# Patient Record
Sex: Female | Born: 1980 | Race: White | Hispanic: No | Marital: Married | Smoking: Current every day smoker
Health system: Southern US, Community
[De-identification: ages and names within clinical notes are randomized; demographics above are authoritative.]

## PROBLEM LIST (undated history)

## (undated) DIAGNOSIS — R42 Dizziness and giddiness: Secondary | ICD-10-CM

## (undated) DIAGNOSIS — F419 Anxiety disorder, unspecified: Secondary | ICD-10-CM

## (undated) DIAGNOSIS — R55 Syncope and collapse: Secondary | ICD-10-CM

## (undated) DIAGNOSIS — R002 Palpitations: Secondary | ICD-10-CM

## (undated) HISTORY — DX: Dizziness and giddiness: R42

## (undated) HISTORY — DX: Syncope and collapse: R55

## (undated) HISTORY — DX: Palpitations: R00.2

---

## 2009-03-29 ENCOUNTER — Emergency Department (HOSPITAL_BASED_OUTPATIENT_CLINIC_OR_DEPARTMENT_OTHER): Admission: EM | Admit: 2009-03-29 | Discharge: 2009-03-29 | Payer: Self-pay | Admitting: Emergency Medicine

## 2009-03-29 ENCOUNTER — Ambulatory Visit: Payer: Self-pay | Admitting: Diagnostic Radiology

## 2010-07-11 ENCOUNTER — Emergency Department (HOSPITAL_BASED_OUTPATIENT_CLINIC_OR_DEPARTMENT_OTHER)
Admission: EM | Admit: 2010-07-11 | Discharge: 2010-07-11 | Disposition: A | Discharge status: Home / Self Care | Payer: Self-pay | Attending: Emergency Medicine | Admitting: Emergency Medicine

## 2010-07-11 DIAGNOSIS — F17200 Nicotine dependence, unspecified, uncomplicated: Secondary | ICD-10-CM | POA: Insufficient documentation

## 2010-07-11 DIAGNOSIS — R21 Rash and other nonspecific skin eruption: Secondary | ICD-10-CM | POA: Insufficient documentation

## 2010-07-13 ENCOUNTER — Emergency Department (HOSPITAL_BASED_OUTPATIENT_CLINIC_OR_DEPARTMENT_OTHER)
Admission: EM | Admit: 2010-07-13 | Discharge: 2010-07-13 | Disposition: A | Discharge status: Home / Self Care | Payer: Self-pay | Attending: Emergency Medicine | Admitting: Emergency Medicine

## 2010-07-13 DIAGNOSIS — F17200 Nicotine dependence, unspecified, uncomplicated: Secondary | ICD-10-CM | POA: Insufficient documentation

## 2010-07-13 DIAGNOSIS — L509 Urticaria, unspecified: Secondary | ICD-10-CM | POA: Insufficient documentation

## 2010-07-14 LAB — DIFFERENTIAL
Basophils Relative: 0 % (ref 0–1)
Eosinophils Absolute: 0.1 10*3/uL (ref 0.0–0.7)
Eosinophils Relative: 1 % (ref 0–5)
Monocytes Absolute: 0.5 10*3/uL (ref 0.1–1.0)
Monocytes Relative: 5 % (ref 3–12)
Neutrophils Relative %: 75 % (ref 43–77)

## 2010-07-14 LAB — URINALYSIS, ROUTINE W REFLEX MICROSCOPIC
Ketones, ur: NEGATIVE mg/dL
Nitrite: NEGATIVE
Specific Gravity, Urine: 1.008 (ref 1.005–1.030)
pH: 5.5 (ref 5.0–8.0)

## 2010-07-14 LAB — COMPREHENSIVE METABOLIC PANEL
ALT: 24 U/L (ref 0–35)
Albumin: 4.4 g/dL (ref 3.5–5.2)
Alkaline Phosphatase: 77 U/L (ref 39–117)
Glucose, Bld: 113 mg/dL — ABNORMAL HIGH (ref 70–99)
Potassium: 4.4 mEq/L (ref 3.5–5.1)
Sodium: 143 mEq/L (ref 135–145)
Total Protein: 7.7 g/dL (ref 6.0–8.3)

## 2010-07-14 LAB — GC/CHLAMYDIA PROBE AMP, GENITAL

## 2010-07-14 LAB — PREGNANCY, URINE

## 2010-07-14 LAB — CBC
Hemoglobin: 13.5 g/dL (ref 12.0–15.0)
RDW: 12.3 % (ref 11.5–15.5)

## 2010-11-15 ENCOUNTER — Encounter (HOSPITAL_COMMUNITY): Payer: Self-pay

## 2010-11-15 ENCOUNTER — Encounter: Payer: Self-pay | Admitting: Emergency Medicine

## 2010-11-15 ENCOUNTER — Emergency Department (HOSPITAL_BASED_OUTPATIENT_CLINIC_OR_DEPARTMENT_OTHER)
Admission: EM | Admit: 2010-11-15 | Discharge: 2010-11-15 | Disposition: A | Discharge status: Home / Self Care | Payer: Self-pay | Attending: Emergency Medicine | Admitting: Emergency Medicine

## 2010-11-15 ENCOUNTER — Inpatient Hospital Stay (HOSPITAL_COMMUNITY)
Admission: AD | Admit: 2010-11-15 | Discharge: 2010-11-15 | Disposition: A | Discharge status: Home / Self Care | Payer: Self-pay | Source: Ambulatory Visit | Attending: Obstetrics & Gynecology | Admitting: Obstetrics & Gynecology

## 2010-11-15 ENCOUNTER — Inpatient Hospital Stay (HOSPITAL_COMMUNITY): Payer: Self-pay

## 2010-11-15 DIAGNOSIS — F17200 Nicotine dependence, unspecified, uncomplicated: Secondary | ICD-10-CM | POA: Insufficient documentation

## 2010-11-15 DIAGNOSIS — O99891 Other specified diseases and conditions complicating pregnancy: Secondary | ICD-10-CM | POA: Insufficient documentation

## 2010-11-15 DIAGNOSIS — O200 Threatened abortion: Secondary | ICD-10-CM | POA: Insufficient documentation

## 2010-11-15 DIAGNOSIS — O4690 Antepartum hemorrhage, unspecified, unspecified trimester: Secondary | ICD-10-CM

## 2010-11-15 DIAGNOSIS — R109 Unspecified abdominal pain: Secondary | ICD-10-CM | POA: Insufficient documentation

## 2010-11-15 LAB — URINALYSIS, ROUTINE W REFLEX MICROSCOPIC
Leukocytes, UA: NEGATIVE
Protein, ur: NEGATIVE mg/dL
Urobilinogen, UA: 0.2 mg/dL (ref 0.0–1.0)

## 2010-11-15 LAB — CBC
HCT: 36.5 % (ref 36.0–46.0)
MCH: 29.7 pg (ref 26.0–34.0)
MCHC: 35.3 g/dL (ref 30.0–36.0)
RDW: 13 % (ref 11.5–15.5)

## 2010-11-15 LAB — DIFFERENTIAL
Basophils Absolute: 0 10*3/uL (ref 0.0–0.1)
Basophils Relative: 0 % (ref 0–1)
Eosinophils Absolute: 0.1 10*3/uL (ref 0.0–0.7)
Monocytes Absolute: 0.6 10*3/uL (ref 0.1–1.0)
Neutro Abs: 9.7 10*3/uL — ABNORMAL HIGH (ref 1.7–7.7)
Neutrophils Relative %: 80 % — ABNORMAL HIGH (ref 43–77)

## 2010-11-15 LAB — PREGNANCY, URINE: Preg Test, Ur: POSITIVE

## 2010-11-15 LAB — WET PREP, GENITAL
Clue Cells Wet Prep HPF POC: NONE SEEN
Yeast Wet Prep HPF POC: NONE SEEN

## 2010-11-15 NOTE — ED Provider Notes (Signed)
History     CSN: [id number] Arrival date & time: 11/15/2010 10:07 AM  Chief Complaint  Patient presents with  . Vaginal Bleeding   Patient is a 30 y.o. female presenting with vaginal bleeding. The history is provided by the patient.  Vaginal Bleeding  Pt reports she had a positive home pregnancy test last week. She thinks she is about [redacted]wks pregnant by LMP in May. She had some vaginal bleeding overnight which has imprvoed this morning. She used 3 pads overnight. She has had some lower abdominal cramping as well, mild-moderate. She did not pass any clots or tissue. She has had two prior pregnancies, no miscarriages, no ectopics, no STDs.   History reviewed. No pertinent past medical history.  History reviewed. No pertinent past surgical history.  History reviewed. No pertinent family history.  History  Substance Use Topics  . Smoking status: Current Everyday Smoker -- 0.5 packs/day for 12 years    Types: Cigarettes  . Smokeless tobacco: Not on file  . Alcohol Use: No    OB History    Grav Para Term Preterm Abortions TAB SAB Ect Mult Living                  Review of Systems  Genitourinary: Positive for vaginal bleeding.  All other systems reviewed and are negative.    Physical Exam  BP 118/68  Pulse 99  Temp(Src) 98.6 F (37 C) (Oral)  Resp 16  Ht 5\' 6"  (1.676 m)  Wt 198 lb (89.812 kg)  BMI 31.96 kg/m2  SpO2 100%  LMP 08/28/2010  Physical Exam  Nursing note and vitals reviewed. Constitutional: She is oriented to person, place, and time. She appears well-developed and well-nourished.  HENT:  Head: Normocephalic and atraumatic.  Eyes: EOM are normal. Pupils are equal, round, and reactive to light.  Neck: Normal range of motion. Neck supple.  Cardiovascular: Normal rate, normal heart sounds and intact distal pulses.   Pulmonary/Chest: Effort normal and breath sounds normal.  Abdominal: Bowel sounds are normal. She exhibits no distension. There is no tenderness.    Genitourinary:       deferred  Musculoskeletal: Normal range of motion. She exhibits no edema and no tenderness.  Neurological: She is alert and oriented to person, place, and time. No cranial nerve deficit.  Skin: Skin is warm and dry. No rash noted.  Psychiatric: She has a normal mood and affect.    ED Course  Procedures  MDM Pt has positive pregnancy test here. Discussed need for further eval including quant, [place], blood work etc to rule out Ectopic. We do not have [place] available here today. Discussed transfer to MAU but patient prefers to go by private vehicle. MAU NP aware patient is coming.       Charles B. Bernette Mayers, MD 11/15/10 1126

## 2010-11-15 NOTE — ED Provider Notes (Signed)
History     CSN: [id number] Arrival date & time: 11/15/2010 12:21 PM  Chief Complaint  Patient presents with  . Vaginal Bleeding  . Abdominal Cramping   HPI Allison Cunningham is a 30 y.o. WF who presents to MAU at 11.[redacted] weeks gestation with low abdominal cramping that started in June. Last week had light bleeding. Here to be sure everything is ok. No PNC. LMP 08/28/10, Last PAP smear 2 years ago and was normal. Was at [facility] today and they suggested she come here for further evaluation. Current sex partner x 3 months. No history of STI's.  No past medical history on file.  Past Surgical History  Procedure Date  . Cesarean section     No family history on file.  History  Substance Use Topics  . Smoking status: Current Everyday Smoker -- 1.0 packs/day for 12 years    Types: Cigarettes  . Smokeless tobacco: Never Used  . Alcohol Use: No    OB History    Grav Para Term Preterm Abortions TAB SAB Ect Mult Living   3 2 2       1       Review of Systems  Constitutional: Positive for unexpected weight change. Negative for fever, chills, diaphoresis and fatigue.  HENT: Negative for ear pain, congestion, sore throat, facial swelling, neck pain, neck stiffness, dental problem and sinus pressure.   Eyes: Negative for photophobia, pain and discharge.  Respiratory: Negative for cough, chest tightness and wheezing.   Cardiovascular: Negative.   Gastrointestinal: Positive for abdominal pain. Negative for nausea, vomiting, diarrhea, constipation and abdominal distention.  Genitourinary: Positive for vaginal pain and pelvic pain. Negative for dysuria, frequency, flank pain and difficulty urinating.  Musculoskeletal: Negative for myalgias, back pain and gait problem.  Skin: Negative for color change and rash.  Neurological: Negative for dizziness, speech difficulty, weakness, light-headedness, numbness and headaches.  Hematological: Negative.   Psychiatric/Behavioral: Negative for confusion and  agitation.    Physical Exam  BP 130/82  Pulse 107  Temp(Src) 98.5 F (36.9 C) (Oral)  Resp 16  Ht 5\' 7"  (1.702 m)  Wt 191 lb 3.2 oz (86.728 kg)  BMI 29.95 kg/m2  LMP 08/28/2010  Physical Exam  Nursing note and vitals reviewed. Constitutional: She is oriented to person, place, and time. She appears well-developed and well-nourished.  HENT:  Head: Normocephalic and atraumatic.  Eyes: EOM are normal.  Neck: Neck supple.  Pulmonary/Chest: Effort normal.  Abdominal: Soft. There is no tenderness.  Genitourinary: Vagina normal. There is no lesion on the right labia. There is no lesion on the left labia.       The cervix is long and closed, there is no adnexal tenderness, the uterus is approximately 12 week size. No bleeding noted.   Musculoskeletal: Normal range of motion.  Neurological: She is alert and oriented to person, place, and time. No cranial nerve deficit.  Skin: Skin is warm and dry.    ED Course  Procedures  Results for orders placed during the hospital encounter of 11/15/10 (from the past 24 hour(s))  CBC     Status: Abnormal   Collection Time   11/15/10  1:50 PM      Component Value Range   WBC 12.2 (*) 4.0 - 10.5 (K/uL)   RBC 4.35  3.87 - 5.11 (MIL/uL)   Hemoglobin 12.9  12.0 - 15.0 (g/dL)   HCT 13.0  86.5 - 78.4 (%)   MCV 83.9  78.0 - 100.0 (fL)  MCH 29.7  26.0 - 34.0 (pg)   MCHC 35.3  30.0 - 36.0 (g/dL)   RDW 40.9  81.1 - 91.4 (%)   Platelets 231  150 - 400 (K/uL)  DIFFERENTIAL     Status: Abnormal   Collection Time   11/15/10  1:50 PM      Component Value Range   Neutrophils Relative 80 (*) 43 - 77 (%)   Neutro Abs 9.7 (*) 1.7 - 7.7 (K/uL)   Lymphocytes Relative 15  12 - 46 (%)   Lymphs Abs 1.8  0.7 - 4.0 (K/uL)   Monocytes Relative 5  3 - 12 (%)   Monocytes Absolute 0.6  0.1 - 1.0 (K/uL)   Eosinophils Relative 0  0 - 5 (%)   Eosinophils Absolute 0.1  0.0 - 0.7 (K/uL)   Basophils Relative 0  0 - 1 (%)   Basophils Absolute 0.0  0.0 - 0.1 (K/uL)    HCG, QUANTITATIVE, PREGNANCY     Status: Abnormal   Collection Time   11/15/10  1:50 PM      Component Value Range   hCG, Beta Chain, [place]est [address] (*) <5 (mIU/mL)  ABO/RH     Status: Normal   Collection Time   11/15/10  1:50 PM      Component Value Range   ABO/RH(D) A NEG    WET PREP, GENITAL     Status: Abnormal   Collection Time   11/15/10  2:05 PM      Component Value Range   Yeast, Wet Prep NONE SEEN  NONE SEEN    Trich, Wet Prep NONE SEEN  NONE SEEN    Clue Cells, Wet Prep NONE SEEN  NONE SEEN    WBC, Wet Prep HPF POC MODERATE (*) NONE SEEN     MDM  Ultrasound today shows a 11.2 wk. IUP with cardiac activity, normal ovaries, no SCH identified.  Assessment: Viable IUP at 11.2 wks. Gestation  Plan: Start Prenatal vitamins and prenatal care.            Return here as needed.     Allison Cunningham, [place] 11/15/10 1537

## 2010-11-15 NOTE — Progress Notes (Signed)
Patient reports had some bleeding heavily several weeks ago patient thought had miscarriage, had a negative pregnancy test after two weeks, started bleeding on Monday or Tuesday, having cramping

## 2010-11-15 NOTE — Progress Notes (Signed)
Pt had normal period 08/28/10.  Had positive UPT in June, followed by heavy bleeding and a negative UPT after.  Had bleeding that began a few days ago (maybe 11/11/10) that ended yesterday, but had a +UPT again.  States has not had intercourse since May.

## 2010-11-15 NOTE — ED Notes (Signed)
Pt states she is pregnant, having some vaginal bleeding.  Lighter than period.  Some cramping but not similar to period.

## 2010-11-16 LAB — URINE CULTURE

## 2010-11-17 LAB — GC/CHLAMYDIA PROBE AMP, GENITAL: Chlamydia, DNA Probe: NEGATIVE

## 2011-07-16 ENCOUNTER — Emergency Department (HOSPITAL_BASED_OUTPATIENT_CLINIC_OR_DEPARTMENT_OTHER)
Admission: EM | Admit: 2011-07-16 | Discharge: 2011-07-16 | Disposition: A | Discharge status: Home / Self Care | Payer: Managed Care, Other (non HMO) | Attending: Emergency Medicine | Admitting: Emergency Medicine

## 2011-07-16 ENCOUNTER — Encounter (HOSPITAL_BASED_OUTPATIENT_CLINIC_OR_DEPARTMENT_OTHER): Payer: Self-pay

## 2011-07-16 DIAGNOSIS — F17200 Nicotine dependence, unspecified, uncomplicated: Secondary | ICD-10-CM | POA: Insufficient documentation

## 2011-07-16 DIAGNOSIS — Z0000 Encounter for general adult medical examination without abnormal findings: Secondary | ICD-10-CM | POA: Insufficient documentation

## 2011-07-16 DIAGNOSIS — IMO0002 Reserved for concepts with insufficient information to code with codable children: Secondary | ICD-10-CM

## 2011-07-16 MED ORDER — DEXAMETHASONE SODIUM PHOSPHATE 10 MG/ML IJ SOLN
INTRAMUSCULAR | Status: AC
  Filled 2011-07-16: qty 1

## 2011-07-16 NOTE — ED Provider Notes (Signed)
History     CSN: [id number]  Arrival date & time 07/16/11  1422   First MD Initiated Contact with Patient 07/16/11 1507      Chief Complaint  Patient presents with  . Rabies Injection    (Consider location/radiation/quality/duration/timing/severity/associated sxs/prior treatment) HPI Comments: Pt comes in today stating that her dog was exposed to a rabid raccoon:pt states that there dog was not vaccinated but has been taken by animal control:pt denies any injury from the dog   The history is provided by the patient.    History reviewed. No pertinent past medical history.  Past Surgical History  Procedure Date  . Cesarean section     No family history on file.  History  Substance Use Topics  . Smoking status: Current Everyday Smoker -- 1.0 packs/day for 12 years    Types: Cigarettes  . Smokeless tobacco: Never Used  . Alcohol Use: No    OB History    Grav Para Term Preterm Abortions TAB SAB Ect Mult Living   3 2 2       1       Review of Systems  Respiratory: Negative.   Cardiovascular: Negative.   Skin: Negative.     Allergies  Bacitracin; Aspirin; Doxycycline; Augmentin; and Penicillins  Home Medications   Current Outpatient Rx  Name Route Sig Dispense Refill  . ALPRAZOLAM 0.5 MG PO TABS Oral Take 0.5 mg by mouth at bedtime as needed.      BP 123/73  Pulse 98  Temp(Src) 98.3 F (36.8 C) (Oral)  Resp 16  Ht 5\' 8"  (1.727 m)  Wt 182 lb (82.555 kg)  BMI 27.67 kg/m2  SpO2 99%  LMP 06/26/2011  Breastfeeding? Unknown  Physical Exam  Nursing note and vitals reviewed. Constitutional: She appears well-developed and well-nourished.  Cardiovascular: Normal rate and regular rhythm.   Pulmonary/Chest: Effort normal and breath sounds normal.  Musculoskeletal: Normal range of motion.  Skin: Skin is warm and dry.    ED Course  Procedures (including critical care time)  Labs Reviewed - No data to display No results found.   1. Normal skin exam        MDM  Pt has not had any injury:pt doesn't meet criteria to have rabies series       Teressa Lower, NP 07/16/11 1556

## 2011-07-16 NOTE — ED Provider Notes (Signed)
Medical screening examination/treatment/procedure(s) were performed by non-physician practitioner and as supervising physician I was immediately available for consultation/collaboration.   Nat Christen, MD 07/16/11 [phone]

## 2011-07-16 NOTE — ED Notes (Signed)
Sent to ED for rabies injection evaluation. Family dog exposed to positive tested raccoon.  No injury.

## 2011-10-02 ENCOUNTER — Emergency Department (HOSPITAL_BASED_OUTPATIENT_CLINIC_OR_DEPARTMENT_OTHER)
Admission: EM | Admit: 2011-10-02 | Discharge: 2011-10-02 | Disposition: A | Discharge status: Home / Self Care | Payer: Managed Care, Other (non HMO) | Attending: Emergency Medicine | Admitting: Emergency Medicine

## 2011-10-02 ENCOUNTER — Emergency Department (HOSPITAL_BASED_OUTPATIENT_CLINIC_OR_DEPARTMENT_OTHER): Payer: Managed Care, Other (non HMO)

## 2011-10-02 ENCOUNTER — Encounter (HOSPITAL_BASED_OUTPATIENT_CLINIC_OR_DEPARTMENT_OTHER): Payer: Self-pay | Admitting: *Deleted

## 2011-10-02 DIAGNOSIS — R1011 Right upper quadrant pain: Secondary | ICD-10-CM | POA: Insufficient documentation

## 2011-10-02 DIAGNOSIS — F411 Generalized anxiety disorder: Secondary | ICD-10-CM | POA: Insufficient documentation

## 2011-10-02 DIAGNOSIS — F17200 Nicotine dependence, unspecified, uncomplicated: Secondary | ICD-10-CM | POA: Insufficient documentation

## 2011-10-02 DIAGNOSIS — R109 Unspecified abdominal pain: Secondary | ICD-10-CM

## 2011-10-02 HISTORY — DX: Anxiety disorder, unspecified: F41.9

## 2011-10-02 LAB — DIFFERENTIAL
Eosinophils Relative: 4 % (ref 0–5)
Lymphocytes Relative: 26 % (ref 12–46)
Lymphs Abs: 2 10*3/uL (ref 0.7–4.0)
Monocytes Relative: 7 % (ref 3–12)

## 2011-10-02 LAB — URINALYSIS, ROUTINE W REFLEX MICROSCOPIC
Bilirubin Urine: NEGATIVE
Glucose, UA: NEGATIVE mg/dL
Ketones, ur: NEGATIVE mg/dL
Leukocytes, UA: NEGATIVE
Protein, ur: NEGATIVE mg/dL

## 2011-10-02 LAB — URINE MICROSCOPIC-ADD ON

## 2011-10-02 LAB — CBC
HCT: 34.9 % — ABNORMAL LOW (ref 36.0–46.0)
Hemoglobin: 12 g/dL (ref 12.0–15.0)
MCV: 82.7 fL (ref 78.0–100.0)
Platelets: 242 10*3/uL (ref 150–400)
RBC: 4.22 MIL/uL (ref 3.87–5.11)
WBC: 7.6 10*3/uL (ref 4.0–10.5)

## 2011-10-02 LAB — COMPREHENSIVE METABOLIC PANEL
ALT: 9 U/L (ref 0–35)
Alkaline Phosphatase: 59 U/L (ref 39–117)
CO2: 27 mEq/L (ref 19–32)
Calcium: 9.3 mg/dL (ref 8.4–10.5)
GFR calc Af Amer: >90 mL/min (ref >90)
GFR calc non Af Amer: >90 mL/min (ref >90)
Glucose, Bld: 98 mg/dL (ref 70–99)
Sodium: 137 mEq/L (ref 135–145)

## 2011-10-02 MED ORDER — HYDROCODONE-ACETAMINOPHEN 5-325 MG PO TABS: ORAL_TABLET | ORAL | Status: AC

## 2011-10-02 MED ORDER — OMEPRAZOLE 20 MG PO CPDR: DELAYED_RELEASE_CAPSULE | ORAL | Status: DC

## 2011-10-02 MED ORDER — SUCRALFATE 1 G PO TABS: 1.0000 g | ORAL_TABLET | Freq: Four times a day (QID) | ORAL | Status: DC

## 2011-10-02 NOTE — ED Provider Notes (Signed)
History     CSN: [id number]  Arrival date & time 10/02/11  1331   First MD Initiated Contact with Patient 10/02/11 1353      Chief Complaint  Patient presents with  . Abdominal Pain    (Consider location/radiation/quality/duration/timing/severity/associated sxs/prior treatment) HPI Comments: Patient presents with chief complaint of abdominal pain for the past 2 days. Pain is in the right upper quadrant with radiation around to her right middle back. Pain is described as a burning. It is associated with nausea and vomiting. Palpation makes the pain worse. Eating makes the pain better. Pain is intermittent. At its worst the pain is 10 out of 10. Pain is currently 4/10. There are times when the pain is 0/10. Patient denies fevers, changes in bowel habits, blood in her stool, black tarry stools, urinary symptoms. The patient has a gallbladder and appendix. Her only abdominal surgeries are 2 C-sections. Patient has been taking omeprazole without relief. Patient takes approximately 1200 mg of ibuprofen daily for headaches.  Patient is a 31 y.o. female presenting with abdominal pain. The history is provided by the patient.  Abdominal Pain The primary symptoms of the illness include abdominal pain, nausea and vomiting. The primary symptoms of the illness do not include fever, diarrhea, hematemesis, hematochezia, dysuria or vaginal discharge. The current episode started 2 days ago. The onset of the illness was sudden. Progression since onset: intermittent.  The illness is associated with NSAID use. The patient states that she believes she is currently not pregnant. The patient has not had a change in bowel habit. Additional symptoms associated with the illness include back pain. Symptoms associated with the illness do not include heartburn or constipation. Significant associated medical issues do not include gallstones.    Past Medical History  Diagnosis Date  . Anxiety     Past Surgical History    Procedure Date  . Cesarean section     No family history on file.  History  Substance Use Topics  . Smoking status: Current Everyday Smoker -- 1.0 packs/day for 12 years    Types: Cigarettes  . Smokeless tobacco: Never Used  . Alcohol Use: No    OB History    Grav Para Term Preterm Abortions TAB SAB Ect Mult Living   3 2 2       1       Review of Systems  Constitutional: Negative for fever.  HENT: Negative for sore throat and rhinorrhea.   Eyes: Negative for redness.  Respiratory: Negative for cough.   Cardiovascular: Negative for chest pain.  Gastrointestinal: Positive for nausea, vomiting and abdominal pain. Negative for heartburn, diarrhea, constipation, blood in stool, hematochezia and hematemesis.  Genitourinary: Negative for dysuria and vaginal discharge.  Musculoskeletal: Positive for back pain. Negative for myalgias.  Skin: Negative for rash.  Neurological: Negative for headaches.    Allergies  Bacitracin; Aspirin; Doxycycline; Amoxicillin-pot clavulanate; and Penicillins  Home Medications   Current Outpatient Rx  Name Route Sig Dispense Refill  . ALPRAZOLAM 0.5 MG PO TABS Oral Take 0.5 mg by mouth at bedtime as needed.      BP 124/76  Pulse 88  Temp 98.6 F (37 C) (Oral)  Resp 20  SpO2 99%  LMP 09/25/2011  Physical Exam  Nursing note and vitals reviewed. Constitutional: She appears well-developed and well-nourished.  HENT:  Head: Normocephalic and atraumatic.  Eyes: Conjunctivae are normal. Right eye exhibits no discharge. Left eye exhibits no discharge.  Neck: Normal range of motion. Neck supple.  Cardiovascular: Normal rate, regular rhythm and normal heart sounds.   Pulmonary/Chest: Effort normal and breath sounds normal.  Abdominal: Soft. There is tenderness in the right upper quadrant and epigastric area. There is guarding (Voluntary) and positive Murphy's sign (Questionable). There is no rigidity, no rebound, no CVA tenderness and no  tenderness at McBurney's point.    Neurological: She is alert.  Skin: Skin is warm and dry.  Psychiatric: She has a normal mood and affect.    ED Course  Procedures (including critical care time)  Labs Reviewed  URINALYSIS, ROUTINE W REFLEX MICROSCOPIC - Abnormal; Notable for the following:    Hgb urine dipstick MODERATE (*)     All other components within normal limits  URINE MICROSCOPIC-ADD ON - Abnormal; Notable for the following:    Squamous Epithelial / LPF FEW (*)     Bacteria, UA MANY (*)     All other components within normal limits  CBC - Abnormal; Notable for the following:    HCT 34.9 (*)     All other components within normal limits  PREGNANCY, URINE  DIFFERENTIAL  COMPREHENSIVE METABOLIC PANEL  LIPASE, BLOOD   US Abdomen Complete  10/02/2011  *RADIOLOGY REPORT*  Clinical Data:  Right side abdominal pain.  Nausea and vomiting.  COMPLETE ABDOMINAL ULTRASOUND  Comparison:  None.  Findings:  Gallbladder:  No gallstones, gallbladder wall thickening, or pericholecystic fluid.  Common bile duct:  Measures 0.2 cm.  Liver:  No focal lesion identified.  Within normal limits in parenchymal echogenicity.  IVC:  Appears normal.  Pancreas:  No focal abnormality seen.  Spleen:  Measures 8.0 cm and appears normal.  Right Kidney:  Measures 11.4 cm and appears normal.  Left Kidney:  Measures 11.7 cm and appears normal.  Abdominal aorta:  No aneurysm identified.  IMPRESSION: Negative abdominal ultrasound.  Original Report Authenticated By: Bernadene Bell. D'ALESSIO, M.D.     1. Abdominal pain     2:23 PM Patient seen and examined. Work-up initiated. Patient does not want pain medication at this time.   Vital signs reviewed and are as follows: Filed Vitals:   10/02/11 1337  BP: 124/76  Pulse: 88  Temp: 98.6 F (37 C)  Resp: 20   3:38 PM Labs, [place] neg. symptoms most consistent with peptic ulcer disease. Patient urged to decrease the amount of ibuprofen that she takes. Will prescribe  omeprazole and Carafate. Will give GI followup.  3:38 PM The patient was urged to return to the Emergency Department immediately with worsening of current symptoms, worsening abdominal pain, persistent vomiting, blood noted in stools, fever, or any other concerns. The patient verbalized understanding.   3:38 PM Patient counseled on use of narcotic pain medications. Counseled not to combine these medications with others containing tylenol. Urged not to drink alcohol, drive, or perform any other activities that requires focus while taking these medications. The patient verbalizes understanding and agrees with the plan.  MDM  Patients with right upper quadrant abdominal pain in the setting of heavy NSAID use. Blood work and ultrasound do not suggest gallstones or biliary colic. History of NSAIDs suggest a picture more consistent with peptic ulcer disease. Will treat for this and given appropriate followup. Patient appears well at time of discharge.        [place], [place] 10/02/11 1545

## 2011-10-02 NOTE — Discharge Instructions (Signed)
Please read and follow all provided instructions.  Your diagnoses today include:  1. Abdominal pain     Tests performed today include:  Blood counts and electrolytes  Blood tests to check liver and kidney function  Blood tests to check pancreas function  Urine test to look for infection and pregnancy (in women)  Ultrasound that does not show gallstones  Vital signs. See below for your results today.   Medications prescribed:   Vicodin (hydrocodone/acetaminophen) - narcotic pain medication  You have been prescribed narcotic pain medication such as Vicodin or Percocet: DO NOT drive or perform any activities that require you to be awake and alert because this medicine can make you drowsy. BE VERY CAREFUL not to take multiple medicines containing Tylenol (also called acetaminophen). Doing so can lead to an overdose which can damage your liver and cause liver failure and possibly death.    Prilosec (omeprazole) - stomach acid reducer  Carafate - medication that coats stomach  Take any prescribed medications only as directed.  Home care instructions:   Follow any educational materials contained in this packet.  Try not to take ibuprofen, aspirin, or naproxen for your headaches  Follow-up instructions: Please follow-up with your primary care provider in the next 1 week for further evaluation of your symptoms. If you do not have a primary care doctor -- see below for referral information.   See the stomach doctor referral for further evaluation.   Return instructions:  SEEK IMMEDIATE MEDICAL ATTENTION IF:  The pain does not go away or becomes severe   A temperature above 101F develops   Repeated vomiting occurs (multiple episodes)   The pain becomes localized to portions of the abdomen. The right side could possibly be appendicitis. In an adult, the left lower portion of the abdomen could be colitis or diverticulitis.   Blood is being passed in stools or vomit (bright  red or black tarry stools)   You develop chest pain, difficulty breathing, dizziness or fainting, or become confused, poorly responsive, or inconsolable (young children)  If you have any other emergent concerns regarding your health  Additional Information: Abdominal (belly) pain can be caused by many things. Your caregiver performed an examination and possibly ordered blood/urine tests and imaging (CT scan, x-rays, ultrasound). Many cases can be observed and treated at home after initial evaluation in the emergency department. Even though you are being discharged home, abdominal pain can be unpredictable. Therefore, you need a repeated exam if your pain does not resolve, returns, or worsens. Most patients with abdominal pain don't have to be admitted to the hospital or have surgery, but serious problems like appendicitis and gallbladder attacks can start out as nonspecific pain. Many abdominal conditions cannot be diagnosed in one visit, so follow-up evaluations are very important.  Your vital signs today were: BP 124/76  Pulse 88  Temp 98.6 F (37 C) (Oral)  Resp 20  SpO2 99%  LMP 09/25/2011 If your blood pressure (bp) was elevated above 135/85 this visit, please have this repeated by your doctor within one month. -------------- No Primary Care Doctor Call [facility]  [phone] Other agencies that provide inexpensive medical care    [facility]  [phone]    [facility]  [phone]    [facility]  [phone]    [facility]  [phone]    [facility]  [phone]    [facility]  [phone] -------------- RESOURCE GUIDE:  Dental Problems  Patients with  Medicaid: [facility] [id number]                                            [phone] [address] Phone:  [phone]                                                      Phone:  [phone]  If unable to pay or uninsured,  contact:  [facility] or [facility]. to become qualified for the adult dental clinic.  Chronic Pain Problems Contact [facility]  [phone] Patients need to be referred by their primary care doctor.  Insufficient Money for Medicine Contact [employer]:  call "211" or [facility] [phone].  Psychological Services [facility]  [phone] [facility]  [phone] [facility]   [phone] (emergency services [phone])  Substance Abuse Resources [facility]  [phone] [facility] [phone] [facility] [phone] Floydene Flock [phone] Residential & Outpatient Substance Abuse Program  [phone]  Abuse/Neglect [facility] Child Abuse Hotline [phone] [facility] Child Abuse Hotline 4798276883 (After Hours)  Emergency Shelter [facility] [phone]  Maternity Homes Room at the [place] [facility] [phone] [place] [facility] [phone]  [facility] Resources  [facility] [place]     [employer]                          [facility] Dept. [address] [place]                       [address]      [address]  Blondell Reveal Phone:  [phone]                                   Phone:  [phone]                 Phone:  [phone]  [facility] Phone:  [phone]  [facility] Child Abuse Hotline [phone] (After Hours)

## 2011-10-02 NOTE — ED Notes (Signed)
Mid abdominal pain x 2 days. Nausea. Better with eating.

## 2011-10-03 NOTE — ED Provider Notes (Signed)
Medical screening examination/treatment/procedure(s) were performed by non-physician practitioner and as supervising physician I was immediately available for consultation/collaboration.   Chaneka Trefz B. Brandin Dilday, MD 10/03/11 0726 

## 2012-04-14 ENCOUNTER — Emergency Department (HOSPITAL_BASED_OUTPATIENT_CLINIC_OR_DEPARTMENT_OTHER)
Admission: EM | Admit: 2012-04-14 | Discharge: 2012-04-14 | Discharge status: Against Medical Advice | Payer: 59 | Attending: Emergency Medicine | Admitting: Emergency Medicine

## 2012-04-14 ENCOUNTER — Encounter (HOSPITAL_BASED_OUTPATIENT_CLINIC_OR_DEPARTMENT_OTHER): Payer: Self-pay

## 2012-04-14 DIAGNOSIS — R059 Cough, unspecified: Secondary | ICD-10-CM | POA: Insufficient documentation

## 2012-04-14 DIAGNOSIS — R05 Cough: Secondary | ICD-10-CM | POA: Insufficient documentation

## 2012-04-14 DIAGNOSIS — IMO0001 Reserved for inherently not codable concepts without codable children: Secondary | ICD-10-CM | POA: Insufficient documentation

## 2012-04-14 DIAGNOSIS — F17200 Nicotine dependence, unspecified, uncomplicated: Secondary | ICD-10-CM | POA: Insufficient documentation

## 2012-04-14 MED ORDER — SODIUM CHLORIDE 0.9 % IV BOLUS (SEPSIS): 1000.0000 mL | Freq: Once | INTRAVENOUS | Status: DC

## 2012-04-14 NOTE — ED Notes (Signed)
Pt reports flu-like symptoms x 2 days.

## 2012-04-14 NOTE — ED Notes (Signed)
Pt not in waiting room

## 2013-04-10 ENCOUNTER — Encounter: Payer: Self-pay | Admitting: *Deleted

## 2013-04-10 ENCOUNTER — Encounter: Payer: Self-pay | Admitting: Cardiology

## 2013-04-11 ENCOUNTER — Encounter: Payer: Self-pay | Admitting: Cardiology

## 2013-04-11 ENCOUNTER — Ambulatory Visit (INDEPENDENT_AMBULATORY_CARE_PROVIDER_SITE_OTHER): Payer: 59 | Admitting: Cardiology

## 2013-04-11 ENCOUNTER — Ambulatory Visit (HOSPITAL_COMMUNITY)
Admission: RE | Admit: 2013-04-11 | Discharge: 2013-04-11 | Disposition: A | Discharge status: Home / Self Care | Payer: 59 | Source: Ambulatory Visit | Attending: Cardiology | Admitting: Cardiology

## 2013-04-11 VITALS — BP 124/72 | HR 83 | Ht 67.0 in | Wt 204.1 lb

## 2013-04-11 DIAGNOSIS — R079 Chest pain, unspecified: Secondary | ICD-10-CM | POA: Insufficient documentation

## 2013-04-11 LAB — LIPID PANEL
Cholesterol: 230 mg/dL — ABNORMAL HIGH (ref 0–200)
HDL: 29.3 mg/dL — ABNORMAL LOW (ref >39.00)
Total CHOL/HDL Ratio: 8
Triglycerides: 494 mg/dL — ABNORMAL HIGH (ref 0.0–149.0)
VLDL: 98.8 mg/dL — ABNORMAL HIGH (ref 0.0–40.0)

## 2013-04-11 LAB — COMPREHENSIVE METABOLIC PANEL
ALT: 14 U/L (ref 0–35)
AST: 15 U/L (ref 0–37)
Albumin: 3.7 g/dL (ref 3.5–5.2)
Alkaline Phosphatase: 48 U/L (ref 39–117)
BUN: 11 mg/dL (ref 6–23)
CO2: 28 mEq/L (ref 19–32)
Calcium: 9 mg/dL (ref 8.4–10.5)
Chloride: 104 mEq/L (ref 96–112)
Creatinine, Ser: 0.6 mg/dL (ref 0.4–1.2)
GFR: 122.24 mL/min (ref >60.00)
Glucose, Bld: 93 mg/dL (ref 70–99)
Potassium: 3.8 mEq/L (ref 3.5–5.1)
Sodium: 136 mEq/L (ref 135–145)
Total Bilirubin: 0.5 mg/dL (ref 0.3–1.2)
Total Protein: 6.7 g/dL (ref 6.0–8.3)

## 2013-04-11 LAB — CBC WITH DIFFERENTIAL/PLATELET
Basophils Absolute: 0.1 10*3/uL (ref 0.0–0.1)
Basophils Relative: 0.7 % (ref 0.0–3.0)
Eosinophils Absolute: 0.5 10*3/uL (ref 0.0–0.7)
Eosinophils Relative: 7.1 % — ABNORMAL HIGH (ref 0.0–5.0)
HCT: 37.4 % (ref 36.0–46.0)
Hemoglobin: 12.6 g/dL (ref 12.0–15.0)
Lymphocytes Relative: 29 % (ref 12.0–46.0)
Lymphs Abs: 2.2 10*3/uL (ref 0.7–4.0)
MCHC: 33.8 g/dL (ref 30.0–36.0)
MCV: 83.5 fl (ref 78.0–100.0)
Monocytes Absolute: 0.5 10*3/uL (ref 0.1–1.0)
Monocytes Relative: 7.3 % (ref 3.0–12.0)
Neutro Abs: 4.2 10*3/uL (ref 1.4–7.7)
Neutrophils Relative %: 55.9 % (ref 43.0–77.0)
Platelets: 258 10*3/uL (ref 150.0–400.0)
RBC: 4.47 Mil/uL (ref 3.87–5.11)
RDW: 14.3 % (ref 11.5–14.6)
WBC: 7.5 10*3/uL (ref 4.5–10.5)

## 2013-04-11 LAB — TSH: TSH: 1.7 u[IU]/mL (ref 0.35–5.50)

## 2013-04-11 LAB — LDL CHOLESTEROL, DIRECT: Direct LDL: 120.3 mg/dL

## 2013-04-11 MED ORDER — METOPROLOL TARTRATE 1 MG/ML IV SOLN
INTRAVENOUS | Status: AC
  Administered 2013-04-11: 5 mg via INTRAVENOUS
  Filled 2013-04-11: qty 10

## 2013-04-11 MED ORDER — LORAZEPAM 2 MG/ML IJ SOLN
1.0000 mg | Freq: Once | INTRAMUSCULAR | Status: AC
  Administered 2013-04-11: 1 mg via INTRAVENOUS
  Filled 2013-04-11: qty 0.5

## 2013-04-11 MED ORDER — NITROGLYCERIN 0.4 MG SL SUBL
SUBLINGUAL_TABLET | SUBLINGUAL | Status: AC
  Administered 2013-04-11: 0.4 mg via SUBLINGUAL
  Filled 2013-04-11: qty 25

## 2013-04-11 MED ORDER — NITROGLYCERIN 0.4 MG SL SUBL
0.4000 mg | SUBLINGUAL_TABLET | SUBLINGUAL | Status: DC | PRN
  Administered 2013-04-11: 0.4 mg via SUBLINGUAL

## 2013-04-11 MED ORDER — LORAZEPAM 2 MG/ML IJ SOLN
INTRAMUSCULAR | Status: AC
  Administered 2013-04-11: 1 mg via INTRAVENOUS
  Filled 2013-04-11: qty 1

## 2013-04-11 MED ORDER — METOPROLOL TARTRATE 1 MG/ML IV SOLN
INTRAVENOUS | Status: AC
  Administered 2013-04-11: 5 mg via INTRAVENOUS
  Filled 2013-04-11: qty 5

## 2013-04-11 MED ORDER — METOPROLOL TARTRATE 1 MG/ML IV SOLN
5.0000 mg | INTRAVENOUS | Status: AC | PRN
  Administered 2013-04-11 (×3): 5 mg via INTRAVENOUS

## 2013-04-11 MED ORDER — ALPRAZOLAM 0.25 MG PO TABS: ORAL_TABLET | ORAL | Status: DC

## 2013-04-11 MED ORDER — IOHEXOL 350 MG/ML SOLN
80.0000 mL | Freq: Once | INTRAVENOUS | Status: AC | PRN
  Administered 2013-04-11: 80 mL via INTRAVENOUS

## 2013-04-11 NOTE — Progress Notes (Signed)
Patient ID: Orissa Arreaga, female   DOB: 1980-07-07, 32 y.o.   MRN: [id number]    Patient Name: Lakelyn Straus Date of Encounter: 04/11/2013  Primary Care Provider:  No primary provider on file. Primary Cardiologist:  Tobias Alexander, H  Problem List   Past Medical History  Diagnosis Date  . Anxiety   . Palpitations   . Dizziness   . Pre-syncope   . Syncope and collapse     has no happened in last year--last 2013   Past Surgical History  Procedure Laterality Date  . Cesarean section      x2   Allergies  Allergies  Allergen Reactions  . Bacitracin Anaphylaxis  . Ibuprofen Other (See Comments)    GI upset in high dosage  . Aspirin   . Clindamycin/Lincomycin   . Doxycycline   . Amoxicillin-Pot Clavulanate Rash  . 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 They ca happen at rest (in bed with position change) or at stress but lately they have been happening with minimal exertion at work. The pain is retrosternal, dull and there is no radiation. It resolves at rest. She also feels more fatigued and slightly SOB on exertion. She is currently experiencing a chest pain, no radiation, no other associated symptoms, it started when she woke up this morning. No recent palpitations or syncope. No LE edema, orthopnea or PND.  The patient has very significant family h/o CAD, her mother had her first MI in her 39', father in his 89' and maternal grandmother at age 23.   Home Medications  Prior to Admission medications   Medication Sig Start Date End Date Taking? Authorizing Provider  acetaminophen (TYLENOL) 325 MG tablet Take 650 mg by mouth every 6 (six) hours as needed. Patient used this medication for pain.    Historical Provider, MD  ALPRAZolam Prudy Feeler) 0.5 MG tablet Take 0.5 mg by mouth at  bedtime as needed.    Historical Provider, MD  ibuprofen (ADVIL,MOTRIN) 200 MG tablet Take 800 mg by mouth every 6 (six) hours as needed. Patient used this medication for her stomach pain.    Historical Provider, MD  omeprazole (PRILOSEC) 20 MG capsule Take one tab PO twice a day for 3 days, then one tab PO once a day 10/02/11   Renne Crigler, PA-C  sucralfate (CARAFATE) 1 G tablet Take 1 tablet (1 g total) by mouth 4 (four) times daily. Take at meals and before bed. 10/02/11   Renne Crigler, PA-C   Family History  Family History  Problem Relation Age of Onset  . Heart attack Mother   . Hypertension Mother   . Heart murmur Mother   . Heart disease Father   . Cirrhosis Father   . Hypertension Father   . Heart attack Maternal Grandmother   . Cancer - Cervical Maternal Grandmother   . Stroke Maternal Grandmother   . Hypertension Maternal Grandmother   . Hypertension Maternal Uncle   . Stroke Maternal Grandfather     Social History  History   Social History  . Marital Status: Single    Spouse Name: N/A    Number of Children: N/A  . Years of Education: N/A   Occupational History  . Not on file.   Social History Main Topics  .  Smoking status: Current Every Day Smoker -- 1.00 packs/day for 17 years    Types: Cigarettes  . Smokeless tobacco: Never Used  . Alcohol Use: No  . Drug Use: No  . Sexual Activity: Not Currently   Other Topics Concern  . Not on file   Social History Narrative  . No narrative on file     Review of Systems, as per HPI, otherwise negative General:  No chills, fever, night sweats or weight changes.  Cardiovascular:  No chest pain, dyspnea on exertion, edema, orthopnea, palpitations, paroxysmal nocturnal dyspnea. Dermatological: No rash, lesions/masses Respiratory: No cough, dyspnea Urologic: No hematuria, dysuria Abdominal:   No nausea, vomiting, diarrhea, bright red blood per rectum, melena, or hematemesis Neurologic:  No visual changes, wkns,  changes in mental status. All other systems reviewed and are otherwise negative except as noted above.  Physical Exam  Blood pressure 124/72, pulse 83, height 5\' 7"  (1.702 m), weight 204 lb 1.9 oz (92.588 kg).  General: Pleasant, NAD Psych: Normal affect. Neuro: Alert and oriented X 3. Moves all extremities spontaneously. HEENT: Normal  Neck: Supple without bruits or JVD. Lungs:  Resp regular and unlabored, CTA. Heart: RRR no s3, s4, or murmurs. Abdomen: Soft, non-tender, non-distended, BS + x 4.  Extremities: No clubbing, cyanosis or edema. DP/PT/Radials 2+ and equal bilaterally.  Labs:  No results found for this basename: CKTOTAL, CKMB, TROPONINI,  in the last 72 hours Lab Results  Component Value Date   WBC 7.6 10/02/2011   HGB 12.0 10/02/2011   HCT 34.9* 10/02/2011   MCV 82.7 10/02/2011   PLT 242 10/02/2011   No results found for this basename: NA, K, CL, CO2, BUN, CREATININE, CALCIUM, LABALBU, PROT, BILITOT, ALKPHOS, ALT, AST, GLUCOSE,  in the last 168 hours No results found for this basename: CHOL, HDL, LDLCALC, TRIG   No results found for this basename: DDIMER   No components found with this basename: POCBNP,   Accessory Clinical Findings  echocardiogram  ECG - SR, raghtward axis, non-specific ST-T wave abnormalities  Lipid Panel     Component Value Date/Time   CHOL 230* 04/11/2013 0943   TRIG 494.0 Triglyceride is over 400; calculations on Lipids are invalid.* 04/11/2013 0943   HDL 29.30* 04/11/2013 0943   CHOLHDL 8 04/11/2013 0943   VLDL 98.8* 04/11/2013 0943     Assessment & Plan  A 32 year old female with chest pain with some typical and some atypical features. She has very significant family h/o CAD. Her ECG today looks normal, however there is an ECG from yesterday that shows poor R wave progression in the precordial leads. This might be caused by a different leads placement, but all the P waves in the limb leads are positive on both ECGs.  At this point  we will draw labs and order a coronary CT for this afternoon. Her resting HR today is 83 BPM, we will give her 50 mg of PO Metoprolol now.   Calcium score of 0 warrants < 1% risk of MI in the next 5 years. Coronary CTA shows normal coronaries.   However, lipid panel shows TAG >400, low HDL. We will order NMR lipid panel and LPa and refer to [facility] to the lipid clinic.  The patient should stop smoking.   Tobias Alexander, Rexene Edison, MD, [facility] 04/11/2013, 9:10 AM

## 2013-04-11 NOTE — Patient Instructions (Addendum)
Metoprolol Tart 50 mg now  Take Xanax 0.25 mg today before Coronary CT  CMP stat today    Fasting Lipid Panel,CBC,TSH today   Coronary CT today [facility] arrive at 2:30 pm nothing to eat or drink after 11:00 am

## 2013-04-12 ENCOUNTER — Other Ambulatory Visit: Payer: Self-pay

## 2013-04-12 ENCOUNTER — Other Ambulatory Visit: Payer: 59

## 2013-04-12 ENCOUNTER — Encounter: Payer: Self-pay | Admitting: Cardiology

## 2013-04-12 DIAGNOSIS — E7800 Pure hypercholesterolemia, unspecified: Secondary | ICD-10-CM

## 2013-04-13 LAB — LIPOPROTEIN A (LPA): Lipoprotein (a): 61 mg/dL — ABNORMAL HIGH (ref 0–30)

## 2013-04-14 LAB — NMR LIPOPROFILE WITH LIPIDS
Cholesterol, Total: 235 mg/dL — ABNORMAL HIGH (ref <200)
HDL Particle Number: 29.8 umol/L — ABNORMAL LOW (ref >=30.5)
HDL Size: 9.2 nm (ref >=9.2)
HDL-C: 36 mg/dL — ABNORMAL LOW (ref >=40)
LDL Particle Number: 2201 nmol/L — ABNORMAL HIGH (ref <1000)
LDL Size: 19.4 nm — ABNORMAL LOW (ref >20.5)
LP-IR Score: 67 — ABNORMAL HIGH (ref <=45)
Large HDL-P: 2.1 umol/L — ABNORMAL LOW (ref >=4.8)
Large VLDL-P: 9.9 nmol/L — ABNORMAL HIGH (ref <=2.7)
Small LDL Particle Number: 1823 nmol/L — ABNORMAL HIGH (ref <=527)
Triglycerides: 413 mg/dL — ABNORMAL HIGH (ref <150)
VLDL Size: 48.7 nm — ABNORMAL HIGH (ref <=46.6)

## 2013-04-18 ENCOUNTER — Ambulatory Visit (INDEPENDENT_AMBULATORY_CARE_PROVIDER_SITE_OTHER): Payer: 59 | Admitting: Pharmacist

## 2013-04-18 DIAGNOSIS — E785 Hyperlipidemia, unspecified: Secondary | ICD-10-CM

## 2013-04-18 DIAGNOSIS — E7849 Other hyperlipidemia: Secondary | ICD-10-CM | POA: Insufficient documentation

## 2013-04-18 DIAGNOSIS — Z79899 Other long term (current) drug therapy: Secondary | ICD-10-CM

## 2013-04-18 MED ORDER — ATORVASTATIN CALCIUM 40 MG PO TABS: 40.0000 mg | ORAL_TABLET | Freq: Every day | ORAL | Status: DC

## 2013-04-18 MED ORDER — OMEGA-3-ACID ETHYL ESTERS 1 G PO CAPS: 2.0000 g | ORAL_CAPSULE | Freq: Two times a day (BID) | ORAL | Status: DC

## 2013-04-18 NOTE — Assessment & Plan Note (Addendum)
Given patient's family history of premature CAD, patient's elevated LDL-P and elevated TG likely have a familial component.  Patient is wishing to proceed with therapy as she doesn't want to end up with early heart disease like much of her family.  She acknowledges that tobacco use is on of the biggest modifiable risk factors, however she tells me she isn't quite ready to quit, but will try to cut back.  She has quit twice in the past on her own, so she knows it can be done when she is ready.  Patient needs a statin given LDL-P > 2000 nmol/L and elevated Lp(a), but also needs to treat her TG aggressively given majority of LDL-P are small and want to make them larger in order to clear them more efficiency.      From a dietary standpoint, we discussed the need to no longer skip meals, and to eat healthy foods for breakfast and lunch.  I told her that using metamucil 1-2 times per day may be helpful if she quit smoking in order to help food cravings if that was a concern of hers.   She will continue to walk 60 minutes per day from an exercise standpoint.  We discussed avoiding foods high in carbohydrates given her elevated TG and family h/o diabetes. Plan: Plan: 1.  Start generic lipitor 40 mg daily (atorvastatin) for elevated LDL-P.  I told her that if she developed muscle aches she could try Co-Enzyme Q-10 200 mg daily.  If this doesn't help she is to call me so we can change therapy. 2.  Start generic lovaza 4 capsules daily (2 AM, 2 PM) for elevated TG. 3.  Start eating a small breakfast and lunch.  Even if piece of fruit going out the door.   4.  Limit eating out, soda use, alcohol use. 5.  Try to cut down on amount you smoke.  Even if get down to 1/2 ppd. 6.  Recheck cholesterol and liver function in 2 months - 3/16 lab work (see Ysidro Evert a few days later 3/19 at 11:30 am)

## 2013-04-18 NOTE — Patient Instructions (Signed)
Plan: 1.  Start generic lipitor 40 mg daily (atorvastatin). 2.  Start generic lovaza 4 capsules daily (2 AM, 2 PM) 3.  Start eating a small breakfast and lunch.  Even if piece of fruit going out the door.   4.  Limit eating out, soda use, alcohol use. 5.  Try to cut down on amount you smoke.  Even if get down to 1/2 ppd. 6.  Recheck cholesterol and liver function in 2 months - 3/16 lab work (see Ysidro Evert a few days later 3/19 at 11:30 am)

## 2013-04-18 NOTE — Progress Notes (Signed)
Patient referred to Lipid Clinic by Dr. [name] due to elevated LDL-P number and elevated Lp(a) in a young female with strong family h/o CAD and recent onset of chest pains.  Patient had a cardiac CTA last week as well, and no abnormal findings.  CAC was 51 in this 33 year old patient.  Patient tells me she doesn't want to end up like her parents and have heart disease at a young age.  She wants to be proactive and treat early if necessary.  She however states she is not ready to quit smoking at this time.  She quit twice in the past "cold [place]" during both of her pregnancies, so acknowledges she knows she can do it again.  She is not interested in having more children.  She has an LDL-P > 2200, and over 80% of particles are small.  TG are > 400 as well despite not using an medication that would increase this, not being a heavy drinker, normal TSH, and doesn't have evidence of insulin resistance on current CMET.  Risk factors:  Strong Family h/o premature CAD (mother MI in her 3's, Father MI in his 66's, maternal grandmother MI at 49 y.o.), elevated Lp(a), tobacco use - LDL goal prefer < 100 mg/dL, non-HDL goal < 130 Medications:  Not on lipid lowering medication at this time. Past lipid lowering meds:  None  Diet:  Patient typically skips breakfast and lunch, then eats whatever her kids don't eat for her dinner.  She states she doesn't have time to eat in the morning, then often just doesn't eat a lunch.  She does admit to having lightheaded episodes during the day from time to time.  For dinner they typically eat fish or ground [place].  Rarely eats red meat.  Typically drinks water or lemon water.  Drinks 2-3 sodas per week, and only drinks alcohol socially, maybe once weekly.   Exercise:  She walks her dogs a few times per day.  Typically this amounts to ~ 60 minutes per day of walking her dog.  This is 5 days per week.  Social history:  Smoked 1 ppd x 15 years.  Only drinks alcohol ~ 1 time per week  (social only). Family history:  Strong for CAD - mother MI in her 4's, Father MI in his 37's, maternal grandmother MI at 42 y.o.  Explained all lab work to patient including why advanced lipid testing was done, what all the values meant, and why she is at much greater risk than a just a traditional lipid panel would represent.  Patient is interested in starting therapy.  Labs:  LDL-P number 2201, small LDL-P 1823, Lp(a) 60 mg/dL, TC 230, TG 413, HDL 36, LDL 120.  LFTs normal.  Glucose 93.  TSH 1.7 (not on lipid lowering therapy).  Current Outpatient Prescriptions  Medication Sig Dispense Refill  . acetaminophen (TYLENOL) 325 MG tablet Take 650 mg by mouth every 6 (six) hours as needed. Patient used this medication for pain.      Marland Kitchen ALPRAZolam (XANAX) 0.25 MG tablet Take one today before cornary ct  10 tablet  0  . ALPRAZolam (XANAX) 0.5 MG tablet Take 0.5 mg by mouth at bedtime as needed.      Marland Kitchen ibuprofen (ADVIL,MOTRIN) 200 MG tablet Take 800 mg by mouth every 6 (six) hours as needed. Patient used this medication for her stomach pain.      Marland Kitchen omeprazole (PRILOSEC) 20 MG capsule Take one tab PO twice a day  for 3 days, then one tab PO once a day  20 capsule  0  . sucralfate (CARAFATE) 1 G tablet Take 1 tablet (1 g total) by mouth 4 (four) times daily. Take at meals and before bed.  30 tablet  0   No current facility-administered medications for this visit.   Allergies  Allergen Reactions  . Bacitracin Anaphylaxis  . Ibuprofen Other (See Comments)    GI upset in high dosage  . Aspirin   . Azithromycin Nausea And Vomiting  . Clindamycin/Lincomycin   . Doxycycline   . Amoxicillin-Pot Clavulanate Rash  . Penicillins Rash   Family History  Problem Relation Age of Onset  . Heart attack Mother   . Hypertension Mother   . Heart murmur Mother   . Heart disease Father   . Cirrhosis Father   . Hypertension Father   . Heart attack Maternal Grandmother   . Cancer - Cervical Maternal  Grandmother   . Stroke Maternal Grandmother   . Hypertension Maternal Grandmother   . Hypertension Maternal Uncle   . Stroke Maternal Grandfather

## 2013-04-19 ENCOUNTER — Encounter: Payer: Self-pay | Admitting: Cardiology

## 2013-04-21 ENCOUNTER — Other Ambulatory Visit: Payer: Self-pay

## 2013-04-21 MED ORDER — VARENICLINE TARTRATE 1 MG PO TABS: 1.0000 mg | ORAL_TABLET | Freq: Two times a day (BID) | ORAL | Status: DC

## 2013-04-21 MED ORDER — VARENICLINE TARTRATE 0.5 MG X 11 & 1 MG X 42 PO MISC: ORAL | Status: DC

## 2013-06-26 ENCOUNTER — Other Ambulatory Visit (INDEPENDENT_AMBULATORY_CARE_PROVIDER_SITE_OTHER): Payer: 59

## 2013-06-26 DIAGNOSIS — E785 Hyperlipidemia, unspecified: Secondary | ICD-10-CM

## 2013-06-26 DIAGNOSIS — Z79899 Other long term (current) drug therapy: Secondary | ICD-10-CM

## 2013-06-27 ENCOUNTER — Other Ambulatory Visit: Payer: 59

## 2013-06-27 LAB — COMPREHENSIVE METABOLIC PANEL
ALT: 20 U/L (ref 0–35)
AST: 18 U/L (ref 0–37)
Albumin: 3.8 g/dL (ref 3.5–5.2)
Alkaline Phosphatase: 60 U/L (ref 39–117)
BUN: 14 mg/dL (ref 6–23)
CO2: 28 mEq/L (ref 19–32)
Calcium: 8.9 mg/dL (ref 8.4–10.5)
Chloride: 104 mEq/L (ref 96–112)
Creatinine, Ser: 0.6 mg/dL (ref 0.4–1.2)
GFR: 119.8 mL/min (ref >60.00)
Glucose, Bld: 81 mg/dL (ref 70–99)
Potassium: 3.9 mEq/L (ref 3.5–5.1)
Sodium: 137 mEq/L (ref 135–145)
Total Bilirubin: 0.3 mg/dL (ref 0.3–1.2)
Total Protein: 7.2 g/dL (ref 6.0–8.3)

## 2013-06-28 LAB — NMR LIPOPROFILE WITH LIPIDS
Cholesterol, Total: 174 mg/dL (ref <200)
HDL Particle Number: 30.8 umol/L (ref >=30.5)
HDL Size: 9.4 nm (ref >=9.2)
HDL-C: 41 mg/dL (ref >=40)
LDL (calc): 90 mg/dL (ref <100)
LDL Particle Number: 1355 nmol/L — ABNORMAL HIGH (ref <1000)
LDL Size: 19.8 nm — ABNORMAL LOW (ref >20.5)
LP-IR Score: 67 — ABNORMAL HIGH (ref <=45)
Large HDL-P: 4 umol/L — ABNORMAL LOW (ref >=4.8)
Large VLDL-P: 6.1 nmol/L — ABNORMAL HIGH (ref <=2.7)
Small LDL Particle Number: 1063 nmol/L — ABNORMAL HIGH (ref <=527)
Triglycerides: 217 mg/dL — ABNORMAL HIGH (ref <150)
VLDL Size: 56.9 nm — ABNORMAL HIGH (ref <=46.6)

## 2013-06-29 ENCOUNTER — Ambulatory Visit: Payer: 59 | Admitting: Pharmacist

## 2013-06-29 ENCOUNTER — Ambulatory Visit (INDEPENDENT_AMBULATORY_CARE_PROVIDER_SITE_OTHER): Payer: 59 | Admitting: Pharmacist

## 2013-06-29 VITALS — Wt 202.0 lb

## 2013-06-29 DIAGNOSIS — Z79899 Other long term (current) drug therapy: Secondary | ICD-10-CM

## 2013-06-29 DIAGNOSIS — E785 Hyperlipidemia, unspecified: Secondary | ICD-10-CM

## 2013-06-29 NOTE — Progress Notes (Signed)
Patient referred to Lipid Clinic by Dr. [name] due to elevated LDL-P number and elevated Lp(a) in a young female with strong family h/o CAD and recent onset of chest pains.  Patient had a cardiac CTA last week as well, and no abnormal findings.  CAC was 21 in this 33 year old patient.  Patient told me she doesn't want to end up like her parents and have heart disease at a young age, and wanted to start therapy.  Lipitor 40 mg qd and Lovaza 4 g/d was started at that time.  She is tolerating both medications well, and her TG and LDL-P number have dropped > 40%.  She however states she is not ready to quit smoking at this time, and still smoking 1 ppd.  She quit twice in the past "cold [place]" during both of her pregnancies, so acknowledges she knows she can do it again.  She is not interested in having more children.  She had an LDL-P > 2200, and now down to ~ 1300 on treatment.  TG were > 400 as well despite not using an medication that would increase this, not being a heavy drinker, normal TSH, and these came down to ~ 200 mg/dL on lovaza.  Risk factors:  Strong Family h/o premature CAD (mother MI in her 69's, Father MI in his 75's, maternal grandmother MI at 20 y.o.), elevated Lp(a), tobacco use - LDL goal prefer < 100 mg/dL, non-HDL goal < 130; LDL-P number at least < 1300, and prefer < 1000 Medications:  Lipitor 40 mg qd, Lovaza 4 g/d Past lipid lowering meds:  None  Diet:  This is much improved.  She is now eating oatmeal for breakfast (was skipping breakfast before).  Lunch is typically fruits and vegetables (skipped often in past).  Dinner is still eating whatever her kids don't eat.  Rarely drinks soda anymore.  Drinks lots of water. Exercise:  She walks her dogs a few times per day.  Typically this amounts to ~ 60 minutes per day of walking her dog.  This is 5 days per week.  Social history:  Smoking 1 ppd currently (x 15 years)  Only drinks alcohol ~ 1 time per week (social only). Family history:   Strong for CAD - mother MI in her 44's, Father MI in his 80's, maternal grandmother MI at 39 y.o.  Explained all lab work to patient including why advanced lipid testing was done, what all the values meant, and why she is at much greater risk than a just a traditional lipid panel would represent.  Patient is interested in starting therapy.  Labs:  LDL-P number 2201, small LDL-P 1823, Lp(a) 60 mg/dL, TC 230, TG 413, HDL 36, LDL 120.  LFTs normal.  Glucose 93.  TSH 1.7 (not on lipid lowering therapy).  Current Outpatient Prescriptions  Medication Sig Dispense Refill  . acetaminophen (TYLENOL) 325 MG tablet Take 650 mg by mouth every 6 (six) hours as needed. Patient used this medication for pain.      Marland Kitchen ALPRAZolam (XANAX) 0.25 MG tablet Take one today before cornary ct  10 tablet  0  . ALPRAZolam (XANAX) 0.5 MG tablet Take 0.5 mg by mouth at bedtime as needed.      Marland Kitchen atorvastatin (LIPITOR) 40 MG tablet Take 1 tablet (40 mg total) by mouth daily.  30 tablet  5  . ibuprofen (ADVIL,MOTRIN) 200 MG tablet Take 800 mg by mouth every 6 (six) hours as needed. Patient used this medication for  her stomach pain.      Marland Kitchen omega-3 acid ethyl esters (LOVAZA) 1 G capsule Take 2 capsules (2 g total) by mouth 2 (two) times daily.  120 capsule  5  . omeprazole (PRILOSEC) 20 MG capsule Take one tab PO twice a day for 3 days, then one tab PO once a day  20 capsule  0  . sucralfate (CARAFATE) 1 G tablet Take 1 tablet (1 g total) by mouth 4 (four) times daily. Take at meals and before bed.  30 tablet  0  . varenicline (CHANTIX CONTINUING MONTH PAK) 1 MG tablet Take 1 tablet (1 mg total) by mouth 2 (two) times daily.  60 tablet  1  . varenicline (CHANTIX STARTING MONTH PAK) 0.5 MG X 11 & 1 MG X 42 tablet Take one 0.5 mg tablet by mouth once daily for 3 days, then increase to one 0.5 mg tablet twice daily for 4 days, then increase to one 1 mg tablet twice daily.  53 tablet  0   No current facility-administered medications for  this visit.   Allergies  Allergen Reactions  . Bacitracin Anaphylaxis  . Ibuprofen Other (See Comments)    GI upset in high dosage  . Aspirin   . Azithromycin Nausea And Vomiting  . Clindamycin/Lincomycin   . Doxycycline   . Amoxicillin-Pot Clavulanate Rash  . Penicillins Rash   Family History  Problem Relation Age of Onset  . Heart attack Mother   . Hypertension Mother   . Heart murmur Mother   . Heart disease Father   . Cirrhosis Father   . Hypertension Father   . Heart attack Maternal Grandmother   . Cancer - Cervical Maternal Grandmother   . Stroke Maternal Grandmother   . Hypertension Maternal Grandmother   . Hypertension Maternal Uncle   . Stroke Maternal Grandfather

## 2013-06-29 NOTE — Assessment & Plan Note (Signed)
Excellent improvement on atorvastatin 40 mg qd and Lovaza 4 g/d, and tolerating them well.  She is excited about her results.  She got to the initial goal of LDL-P number of 1300 or less.  Given her family history and elevated Lp(a), would like to see LDL-P < 1000 if possible.  Will wait another 6 months and have her continue her healthier eating habits, current medications, and try to stop smoking.  In 6 months, if no further improvement, may try to increase atorvastatin to 80 mg at that time.

## 2013-06-29 NOTE — Patient Instructions (Signed)
1.  Continue atorvastatin 40 mg once daily. 2.  Continue lovaza 4 per day. 3.  Try to cut back on cigarettes 4.  Recheck cholesterol and liver in 6 months (01/02/14 - fasting labs, show up anytime after 7:30); see Ysidro Evert 2 days later (01/04/14 at 11:30 am)

## 2014-01-02 ENCOUNTER — Other Ambulatory Visit: Payer: 59

## 2014-01-04 ENCOUNTER — Ambulatory Visit: Payer: 59 | Admitting: Pharmacist

## 2014-02-12 ENCOUNTER — Encounter: Payer: Self-pay | Admitting: Cardiology

## 2014-04-17 DIAGNOSIS — D242 Benign neoplasm of left breast: Secondary | ICD-10-CM | POA: Insufficient documentation

## 2014-07-19 ENCOUNTER — Telehealth: Payer: 59 | Admitting: Physician Assistant

## 2014-07-19 DIAGNOSIS — H109 Unspecified conjunctivitis: Secondary | ICD-10-CM

## 2014-07-19 MED ORDER — OFLOXACIN 0.3 % OP SOLN: OPHTHALMIC | Status: DC

## 2014-07-19 NOTE — Progress Notes (Signed)
We are sorry that you are not feeling well.  Here is how we plan to help!  Based on what you have shared with me it looks like you have conjunctivitis.  Conjunctivitis is a common inflammatory or infectious condition of the eye that is often referred to as "pink eye".  In most cases it is contagious (viral or bacterial). However, not all conjunctivitis requires antibiotics (ex. Allergic).  We have made appropriate suggestions for you based upon your presentation.  I have prescribed Oflaxacin 1-2 drops 4 times a day times 5 days   Pink eye can be highly contagious.  It is typically spread through direct contact with secretions, or contaminated objects or surfaces that one may have touched.  Strict handwashing is suggested with soap and water is urged.  If not available, use alcohol based had sanitizer.  Avoid unnecessary touching of the eye.  If you wear contact lenses, you will need to refrain from wearing them until you see no white discharge from the eye for at least 24 hours after being on medication.  You should see symptom improvement in 1-2 days after starting the medication regimen.  Call us if symptoms are not improved in 1-2 days.  Home Care:  Wash your hands often!  Do not wear your contacts until you complete your treatment plan.  Avoid sharing towels, bed linen, personal items with a person who has pink eye.  See attention for anyone in your home with similar symptoms.  Get Help Right Away If:  Your symptoms do not improve.  You develop blurred or loss of vision.  Your symptoms worsen (increased discharge, pain or redness)  Your e-visit answers were reviewed by a board certified advanced clinical practitioner to complete your personal care plan.  Depending on the condition, your plan could have included both over the counter or prescription medications.  If there is a problem please reply  once you have received a response from your provider.  Your safety is important to [place].   If you have drug allergies check your prescription carefully.    You can use MyChart to ask questions about today's visit, request a non-urgent call back, or ask for a work or school excuse.  You will get an e-mail in the next two days asking about your experience.  I hope that your e-visit has been valuable and will speed your recovery. Thank you for using e-visits.

## 2014-07-23 ENCOUNTER — Telehealth: Payer: Self-pay

## 2014-07-23 MED ORDER — ATORVASTATIN CALCIUM 40 MG PO TABS: 40.0000 mg | ORAL_TABLET | Freq: Every day | ORAL | Status: DC

## 2014-07-23 NOTE — Telephone Encounter (Signed)
-----   Message from Dorothy Spark, MD sent at 07/23/2014  3:56 PM EDT ----- Could you please call her pharmacy with a prescription for atorvastatin 40 mg po daily and schedule an appointment with me the next week? Even if we have to overbook. Also, she will need TSH, CBC and CMP in 3 weeks. Thank you, KN

## 2014-07-23 NOTE — Telephone Encounter (Signed)
Pharmacy verified by pt, Rx sent to preferred pharmacy, and labs scheduled for 5/2.  Pt agreed with plan and no additional questions at this time.

## 2014-08-06 ENCOUNTER — Encounter: Payer: Self-pay | Admitting: *Deleted

## 2014-08-09 ENCOUNTER — Encounter: Payer: Self-pay | Admitting: Cardiology

## 2014-08-09 ENCOUNTER — Ambulatory Visit (INDEPENDENT_AMBULATORY_CARE_PROVIDER_SITE_OTHER): Payer: 59 | Admitting: Cardiology

## 2014-08-09 VITALS — BP 122/72 | HR 96 | Ht 67.0 in | Wt 212.0 lb

## 2014-08-09 DIAGNOSIS — R002 Palpitations: Secondary | ICD-10-CM | POA: Diagnosis not present

## 2014-08-09 DIAGNOSIS — R079 Chest pain, unspecified: Secondary | ICD-10-CM

## 2014-08-09 NOTE — Patient Instructions (Signed)
Medication Instructions:   Your physician recommends that you continue on your current medications as directed. Please refer to the Current Medication list given to you today.     Testing/Procedures:  Your physician has recommended that you wear a 48 HOUR holter monitor. Holter monitors are medical devices that record the heart's electrical activity. Doctors most often use these monitors to diagnose arrhythmias. Arrhythmias are problems with the speed or rhythm of the heartbeat. The monitor is a small, portable device. You can wear one while you do your normal daily activities. This is usually used to diagnose what is causing palpitations/syncope (passing out).    Follow-Up:  Your physician recommends that you schedule a follow-up appointment in: [place]

## 2014-08-09 NOTE — Progress Notes (Signed)
Patient ID: Allison Cunningham, female   DOB: [birth year], 34 y.o.   MRN: [id number]    Patient Name: Allison Cunningham Date of Encounter: 08/09/2014  Primary Care Provider:  Ma Rings, MD Primary Cardiologist:  Allison Cunningham  Problem List   Past Medical History  Diagnosis Date  . Anxiety   . Palpitations   . Dizziness   . Pre-syncope   . Syncope and collapse     has no happened in last year--last 2013   Past Surgical History  Procedure Laterality Date  . Cesarean section      x2   Allergies  Allergies  Allergen Reactions  . Bacitracin Anaphylaxis  . Ibuprofen Other (See Comments)    GI upset in high dosage  . Aspirin   . Azithromycin Nausea And Vomiting  . Clindamycin/Lincomycin   . Doxycycline   . Amoxicillin-Pot Clavulanate Rash  . 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 They ca happen at rest (in bed with position change) or at stress but lately they have been happening with minimal exertion at work. The pain is retrosternal, dull and there is no radiation. It resolves at rest. She also feels more fatigued and slightly SOB on exertion. She is currently experiencing a chest pain, no radiation, no other associated symptoms, it started when she woke up this morning. No recent palpitations or syncope. No LE edema, orthopnea or PND.  The patient has very significant family h/o CAD, her mother had her first MI in her 60', father in his 55' and maternal grandmother at age 6.   Allison Cunningham is coming after 1 year, she hasn't been taking atorvastatin and lovaza advised by [employer] 1 year ago. She continues to have occasional chest pains and palpitations that start all sudden and and all sudden, they can last a few minutes and are associated with some chest pain shortness of breath  and dizziness. She states that she had that during her pregnancy and was evaluated by Dr. in the past but she didn't follow afterwards. She doesn't know what arrhythmia was found at that time. She denies any syncopal episode. She continues to smoke. She was started on atorvastatin 40 mg daily 2 weeks ago. She is compliant to that and doesn't have any side effects.  Home Medications  Prior to Admission medications   Medication Sig Start Date End Date Taking? Authorizing Provider  acetaminophen (TYLENOL) 325 MG tablet Take 650 mg by mouth every 6 (six) hours as needed. Patient used this medication for pain.    Historical Provider, MD  ALPRAZolam Duanne Moron) 0.5 MG tablet Take 0.5 mg by mouth at bedtime as needed.    Historical Provider, MD  ibuprofen (ADVIL,MOTRIN) 200 MG tablet Take 800 mg by mouth every 6 (six) hours as needed. Patient used this medication for her stomach pain.    Historical Provider, MD  omeprazole (PRILOSEC) 20 MG capsule Take one tab PO twice a day for 3 days, then one tab PO once a day 10/02/11   Carlisle Cater, PA-C  sucralfate (CARAFATE) 1 G tablet Take 1 tablet (1 g total) by mouth 4 (four) times daily. Take at meals and before bed. 10/02/11   Carlisle Cater, PA-C   Family History  Family History  Problem Relation Age of  Onset  . Heart attack Mother   . Hypertension Mother   . Heart murmur Mother   . Heart disease Father   . Cirrhosis Father   . Hypertension Father   . Heart attack Maternal Grandmother   . Cervical cancer Maternal Grandmother   . Stroke Maternal Grandmother   . Hypertension Maternal Grandmother   . Hypertension Maternal Uncle   . Stroke Maternal Grandfather     Social History  History   Social History  . Marital Status: Single    Spouse Name: N/A  . Number of Children: N/A  . Years of Education: N/A   Occupational History  . Not on file.   Social History Main Topics  . Smoking status: Current Every Day Smoker -- 1.00 packs/day for 17 years      Types: Cigarettes  . Smokeless tobacco: Never Used  . Alcohol Use: No  . Drug Use: No  . Sexual Activity: Not Currently   Other Topics Concern  . Not on file   Social History Narrative     Review of Systems, as per HPI, otherwise negative General:  No chills, fever, night sweats or weight changes.  Cardiovascular:  No chest pain, dyspnea on exertion, edema, orthopnea, palpitations, paroxysmal nocturnal dyspnea. Dermatological: No rash, lesions/masses Respiratory: No cough, dyspnea Urologic: No hematuria, dysuria Abdominal:   No nausea, vomiting, diarrhea, bright red blood per rectum, melena, or hematemesis Neurologic:  No visual changes, wkns, changes in mental status. All other systems reviewed and are otherwise negative except as noted above.  Physical Exam  Blood pressure 122/72, pulse 96, height 5\' 7"  (1.702 m), weight 212 lb (96.163 kg).  General: Pleasant, NAD Psych: Normal affect. Neuro: Alert and oriented X 3. Moves all extremities spontaneously. HEENT: Normal  Neck: Supple without bruits or JVD. Lungs:  Resp regular and unlabored, CTA. Heart: RRR no s3, s4, or murmurs. Abdomen: Soft, non-tender, non-distended, BS + x 4.  Extremities: No clubbing, cyanosis or edema. DP/PT/Radials 2+ and equal bilaterally.  Labs:  No results for input(s): CKTOTAL, CKMB, TROPONINI in the last 72 hours. Lab Results  Component Value Date   WBC 7.5 04/11/2013   HGB 12.6 04/11/2013   HCT 37.4 04/11/2013   MCV 83.5 04/11/2013   PLT 258.0 04/11/2013   No results for input(s): NA, K, CL, CO2, BUN, CREATININE, CALCIUM, PROT, BILITOT, ALKPHOS, ALT, AST, GLUCOSE in the last 168 hours.  Invalid input(s): LABALBU Lab Results  Component Value Date   CHOL 174 06/27/2013   No results found for: DDIMER Invalid input(s): POCBNP  Accessory Clinical Findings  echocardiogram  ECG - SR, raghtward axis, non-specific ST-T wave abnormalities  Lipid Panel     Component Value Date/Time    CHOL 174 06/27/2013 0913   CHOL 230* 04/11/2013 0943   TRIG 217* 06/27/2013 0913   TRIG * 04/11/2013 0943    494.0 Triglyceride is over 400; calculations on Lipids are invalid.   HDL 41 06/27/2013 0913   HDL 29.30* 04/11/2013 0943   CHOLHDL 8 04/11/2013 0943   VLDL 98.8* 04/11/2013 0943   LDLCALC 90 06/27/2013 0913   Coronary CTA/Calcium score 04/11/2013  IMPRESSION: 1) Calcium Score 0 2) Normal right dominant coronary arteries with no anomalies 3) Normal Ascending Aorta 4) No proximal pulmonary emboli  EKG: Normal sinus rhythm normal EKG.  Assessment & Plan  A 34 year old female with chest pain with some typical and some atypical features. She has very significant family h/o CAD. Her ECG today  looks normal. Calcium score of 0 warrants < 1% risk of MI in the next 5 years. Coronary CTA shows normal coronaries.   However, lipid panel shows TAG >400, low HDL. Her lipoprotein a was 67. She was seen by Alferd Apa last year and prescribed atorvastatin 40 mg po daily, that she just started 2 weeks ago.  She is now experiencing palpitations with history of arrhythmias in the past, we will schedule 48 hour Holter monitor to evaluate. No further ischemic workup right now as her coronary CTA was normal year ago.  We will repeat her comprehensive metabolic profile and lipids in 1 months if her lipids are abnormal we will consider adding lovastatin 4 g daily. She was consulted on smoking cessation she understands and will try to work on that.   Allison Spark, MD, [facility] 08/09/2014, 10:25 AM

## 2014-08-10 ENCOUNTER — Encounter (INDEPENDENT_AMBULATORY_CARE_PROVIDER_SITE_OTHER): Payer: 59

## 2014-08-10 ENCOUNTER — Encounter: Payer: Self-pay | Admitting: Radiology

## 2014-08-10 DIAGNOSIS — R002 Palpitations: Secondary | ICD-10-CM

## 2014-08-10 DIAGNOSIS — R079 Chest pain, unspecified: Secondary | ICD-10-CM

## 2014-08-10 NOTE — Progress Notes (Signed)
Patient ID: Allison Cunningham, female   DOB: 1981/03/13, 34 y.o.   MRN: [id number] [facility] 48hr holter applied

## 2014-08-13 ENCOUNTER — Other Ambulatory Visit: Payer: 59

## 2014-08-16 NOTE — Addendum Note (Signed)
Addended by: Avie Echevaria on: 08/16/2014 05:17 PM   Modules accepted: Orders

## 2014-08-17 ENCOUNTER — Other Ambulatory Visit: Payer: Self-pay | Admitting: *Deleted

## 2014-08-17 LAB — CBC WITH DIFFERENTIAL/PLATELET
Basophils Absolute: 0.1 10*3/uL (ref 0.0–0.1)
Basophils Relative: 0.6 % (ref 0.0–3.0)
Eosinophils Absolute: 0.8 10*3/uL — ABNORMAL HIGH (ref 0.0–0.7)
Eosinophils Relative: 7.8 % — ABNORMAL HIGH (ref 0.0–5.0)
HCT: 38.4 % (ref 36.0–46.0)
Hemoglobin: 13.2 g/dL (ref 12.0–15.0)
Lymphocytes Relative: 28.8 % (ref 12.0–46.0)
Lymphs Abs: 2.9 10*3/uL (ref 0.7–4.0)
MCHC: 34.4 g/dL (ref 30.0–36.0)
MCV: 83.9 fl (ref 78.0–100.0)
Monocytes Absolute: 0.6 10*3/uL (ref 0.1–1.0)
Monocytes Relative: 6.2 % (ref 3.0–12.0)
Neutro Abs: 5.8 10*3/uL (ref 1.4–7.7)
Neutrophils Relative %: 56.6 % (ref 43.0–77.0)
Platelets: 293 10*3/uL (ref 150.0–400.0)
RBC: 4.58 Mil/uL (ref 3.87–5.11)
RDW: 13.7 % (ref 11.5–15.5)
WBC: 10.3 10*3/uL (ref 4.0–10.5)

## 2014-08-17 LAB — COMPREHENSIVE METABOLIC PANEL
ALT: 24 U/L (ref 0–35)
AST: 22 U/L (ref 0–37)
Albumin: 4 g/dL (ref 3.5–5.2)
Alkaline Phosphatase: 65 U/L (ref 39–117)
BUN: 16 mg/dL (ref 6–23)
CO2: 24 mEq/L (ref 19–32)
Calcium: 9.1 mg/dL (ref 8.4–10.5)
Chloride: 104 mEq/L (ref 96–112)
Creatinine, Ser: 0.73 mg/dL (ref 0.40–1.20)
GFR: 98.48 mL/min (ref >60.00)
Glucose, Bld: 89 mg/dL (ref 70–99)
Potassium: 3.9 mEq/L (ref 3.5–5.1)
Sodium: 137 mEq/L (ref 135–145)
Total Bilirubin: 0.3 mg/dL (ref 0.2–1.2)
Total Protein: 7.1 g/dL (ref 6.0–8.3)

## 2014-08-17 LAB — TSH: TSH: 1.16 u[IU]/mL (ref 0.35–4.50)

## 2014-08-17 MED ORDER — CARVEDILOL 3.125 MG PO TABS: 3.1250 mg | ORAL_TABLET | Freq: Two times a day (BID) | ORAL | Status: DC

## 2014-08-20 ENCOUNTER — Other Ambulatory Visit: Payer: Self-pay

## 2014-08-20 MED ORDER — CARVEDILOL 3.125 MG PO TABS: 3.1250 mg | ORAL_TABLET | Freq: Two times a day (BID) | ORAL | Status: DC

## 2014-09-16 ENCOUNTER — Telehealth: Payer: Self-pay | Admitting: Family

## 2014-09-16 DIAGNOSIS — J0120 Acute ethmoidal sinusitis, unspecified: Secondary | ICD-10-CM

## 2014-09-16 MED ORDER — FLUTICASONE FUROATE 27.5 MCG/SPRAY NA SUSP: 1.0000 | Freq: Two times a day (BID) | NASAL | Status: DC

## 2014-09-16 NOTE — Progress Notes (Signed)
We are sorry that you are not feeling well.  Here is how we plan to help!  Based on what you have shared with me it looks like you have sinusitis.  Sinusitis is inflammation and infection in the sinus cavities of the head.  Based on your presentation I believe you most likely have Acute Viral Sinusitis. This is an infection most likely caused by a virus.  There is not specific treatment for viral sinusitis other than to help you with the symptoms until the infection runs it's course.  You may use an oral decongestant such as Mucinex D or if you have glaucoma or high blood pressure use plain Mucinex.  Saline nasal spray help and can safely be used as often as needed for congestion, I have prescribed fluticasone nasal spray. Spray one sprays in each nostril twice a day to help reduce your symptoms.  Some authorities believe that zinc sprays or the use of Echinacea may shorten the course of your symptoms.  Sinus infections are not as easily transmitted as other respiratory infection, however we still recommend that you avoid close contact with loved ones, especially the very young and elderly.  Remember to wash your hands thoroughly throughout the day as this is the number one way to prevent the spread of infection!  Home Care:  Only take medications as instructed by your medical team.  Complete the entire course of an antibiotic.  Do not take these medications with alcohol.  A steam or ultrasonic humidifier can help congestion.  You can place a towel over your head and breathe in the steam from hot water coming from a faucet.  Avoid close contacts especially the very young and the elderly.  Cover your mouth when you cough or sneeze.  Always remember to wash your hands.  Get Help Right Away If:  You develop worsening fever or sinus pain.  You develop a severe head ache or visual changes.  Your symptoms persist after you have completed your treatment plan.  Make sure you  Understand these  instructions.  Will watch your condition.  Will get help right away if you are not doing well or get worse.  Your e-visit answers were reviewed by a board certified advanced clinical practitioner to complete your personal care plan.  Depending on the condition, your plan could have included both over the counter or prescription medications.  If there is a problem please reply  once you have received a response from your provider.  Your safety is important to [place].  If you have drug allergies check your prescription carefully.    You can use MyChart to ask questions about today's visit, request a non-urgent call back, or ask for a work or school excuse.  You will get an e-mail in the next two days asking about your experience.  I hope that your e-visit has been valuable and will speed your recovery. Thank you for using e-visits.

## 2014-10-08 ENCOUNTER — Telehealth: Payer: Self-pay | Admitting: Family

## 2014-10-08 DIAGNOSIS — M5412 Radiculopathy, cervical region: Secondary | ICD-10-CM

## 2014-10-08 MED ORDER — CYCLOBENZAPRINE HCL 10 MG PO TABS: 10.0000 mg | ORAL_TABLET | Freq: Three times a day (TID) | ORAL | Status: DC | PRN

## 2014-10-08 NOTE — Progress Notes (Signed)
We are sorry that you are not feeling well.  Here is how we plan to help!  Based on what you have shared with me it looks like you mostly have acute back pain.  Acute back pain is defined as musculoskeletal pain that can resolve in 1-3 weeks with conservative treatment.  I have prescribed  Flexeril 10 mg every eight hours as needed which is a muscle relaxer.  Some patients experience stomach irritation or in increased heartburn with anti-inflammatory drugs.  Please keep in mind that muscle relaxer's can cause fatigue and should not be taken while at work or driving.  Back pain is very common.  The pain often gets better over time.  The cause of back pain is usually not dangerous.  Most people can learn to manage their back pain on their own.  Home Care  Stay active.  Start with short walks on flat ground if you can.  Try to walk farther each day.  Do not sit, drive or stand in one place for more than 30 minutes.  Do not stay in bed.  Do not avoid exercise or work.  Activity can help your back heal faster.  Be careful when you bend or lift an object.  Bend at your knees, keep the object close to you, and do not twist.  Sleep on a firm mattress.  Lie on your side, and bend your knees.  If you lie on your back, put a pillow under your knees.  Only take medicines as told by your doctor.  Put ice on the injured area.  Put ice in a plastic bag  Place a towel between your skin and the bag  Leave the ice on for 15-20 minutes, 3-4 times a day for the first 2-3 days.  After that, you can switch between ice and heat packs.  Ask your doctor about back exercises or massage.  Avoid feeling anxious or stressed.  Find good ways to deal with stress, such as exercise.  Get Help Right Way If:  Your pain does not go away with rest or medicine.  Your pain does not go away in 1 week.  You have new problems.  You do not feel well.  The pain spreads into your legs.  You cannot control when you  poop (bowel movement) or pee (urinate)  You feel sick to your stomach (nauseous) or throw up (vomit)  You have belly (abdominal) pain.  You feel like you may pass out (faint).  If you develop a fever.  Make Sure you:  Understand these instructions.  Will watch your condition  Will get help right away if you are not doing well or get worse.  Your e-visit answers were reviewed by a board certified advanced clinical practitioner to complete your personal care plan.  Depending on the condition, your plan could have included both over the counter or prescription medications.  If there is a problem please reply  once you have received a response from your provider.  Your safety is important to [place].  If you have drug allergies check your prescription carefully.    You can use MyChart to ask questions about today's visit, request a non-urgent call back, or ask for a work or school excuse.  You will get an e-mail in the next two days asking about your experience.  I hope that your e-visit has been valuable and will speed your recovery. Thank you for using e-visits.

## 2015-01-12 ENCOUNTER — Ambulatory Visit (INDEPENDENT_AMBULATORY_CARE_PROVIDER_SITE_OTHER): Payer: 59 | Admitting: Family Medicine

## 2015-01-12 VITALS — BP 110/70 | HR 95 | Temp 98.4°F | Resp 17 | Ht 66.0 in | Wt 213.4 lb

## 2015-01-12 DIAGNOSIS — J0101 Acute recurrent maxillary sinusitis: Secondary | ICD-10-CM

## 2015-01-12 DIAGNOSIS — J209 Acute bronchitis, unspecified: Secondary | ICD-10-CM

## 2015-01-12 MED ORDER — HYDROCODONE-HOMATROPINE 5-1.5 MG/5ML PO SYRP: 5.0000 mL | ORAL_SOLUTION | Freq: Three times a day (TID) | ORAL | Status: DC | PRN

## 2015-01-12 MED ORDER — AZITHROMYCIN 250 MG PO TABS: ORAL_TABLET | ORAL | Status: DC

## 2015-01-12 NOTE — Patient Instructions (Signed)
Acute Bronchitis Bronchitis is inflammation of the airways that extend from the windpipe into the lungs (bronchi). The inflammation often causes mucus to develop. This leads to a cough, which is the most common symptom of bronchitis.  In acute bronchitis, the condition usually develops suddenly and goes away over time, usually in a couple weeks. Smoking, allergies, and asthma can make bronchitis worse. Repeated episodes of bronchitis may cause further lung problems.  CAUSES Acute bronchitis is most often caused by the same virus that causes a cold. The virus can spread from person to person (contagious) through coughing, sneezing, and touching contaminated objects. SIGNS AND SYMPTOMS   Cough.   Fever.   Coughing up mucus.   Body aches.   Chest congestion.   Chills.   Shortness of breath.   Sore throat.  DIAGNOSIS  Acute bronchitis is usually diagnosed through a physical exam. Your health care provider will also ask you questions about your medical history. Tests, such as chest X-rays, are sometimes done to rule out other conditions.  TREATMENT  Acute bronchitis usually goes away in a couple weeks. Oftentimes, no medical treatment is necessary. Medicines are sometimes given for relief of fever or cough. Antibiotic medicines are usually not needed but may be prescribed in certain situations. In some cases, an inhaler may be recommended to help reduce shortness of breath and control the cough. A cool mist vaporizer may also be used to help thin bronchial secretions and make it easier to clear the chest.  HOME CARE INSTRUCTIONS  Get plenty of rest.   Drink enough fluids to keep your urine clear or pale yellow (unless you have a medical condition that requires fluid restriction). Increasing fluids may help thin your respiratory secretions (sputum) and reduce chest congestion, and it will prevent dehydration.   Take medicines only as directed by your health care provider.  If  you were prescribed an antibiotic medicine, finish it all even if you start to feel better.  Avoid smoking and secondhand smoke. Exposure to cigarette smoke or irritating chemicals will make bronchitis worse. If you are a smoker, consider using nicotine gum or skin patches to help control withdrawal symptoms. Quitting smoking will help your lungs heal faster.   Reduce the chances of another bout of acute bronchitis by washing your hands frequently, avoiding people with cold symptoms, and trying not to touch your hands to your mouth, nose, or eyes.   Keep all follow-up visits as directed by your health care provider.  SEEK MEDICAL CARE IF: Your symptoms do not improve after 1 week of treatment.  SEEK IMMEDIATE MEDICAL CARE IF:  You develop an increased fever or chills.   You have chest pain.   You have severe shortness of breath.  You have bloody sputum.   You develop dehydration.  You faint or repeatedly feel like you are going to pass out.  You develop repeated vomiting.  You develop a severe headache. MAKE SURE YOU:   Understand these instructions.  Will watch your condition.  Will get help right away if you are not doing well or get worse. Document Released: 05/07/2004 Document Revised: 08/14/2013 Document Reviewed: 09/20/2012 ExitCare Patient Information 2015 [employer]. This information is not intended to replace advice given to you by your health care provider. Make sure you discuss any questions you have with your health care provider. Sinusitis Sinusitis is redness, soreness, and inflammation of the paranasal sinuses. Paranasal sinuses are air pockets within the bones of your face (beneath the   eyes, the middle of the forehead, or above the eyes). In healthy paranasal sinuses, mucus is able to drain out, and air is able to circulate through them by way of your nose. However, when your paranasal sinuses are inflamed, mucus and air can become trapped. This can  allow bacteria and other germs to grow and cause infection. Sinusitis can develop quickly and last only a short time (acute) or continue over a long period (chronic). Sinusitis that lasts for more than 12 weeks is considered chronic.  CAUSES  Causes of sinusitis include:  Allergies.  Structural abnormalities, such as displacement of the cartilage that separates your nostrils (deviated septum), which can decrease the air flow through your nose and sinuses and affect sinus drainage.  Functional abnormalities, such as when the small hairs (cilia) that line your sinuses and help remove mucus do not work properly or are not present. SIGNS AND SYMPTOMS  Symptoms of acute and chronic sinusitis are the same. The primary symptoms are pain and pressure around the affected sinuses. Other symptoms include:  Upper toothache.  Earache.  Headache.  Bad breath.  Decreased sense of smell and taste.  A cough, which worsens when you are lying flat.  Fatigue.  Fever.  Thick drainage from your nose, which often is green and may contain pus (purulent).  Swelling and warmth over the affected sinuses. DIAGNOSIS  Your health care provider will perform a physical exam. During the exam, your health care provider may:  Look in your nose for signs of abnormal growths in your nostrils (nasal polyps).  Tap over the affected sinus to check for signs of infection.  View the inside of your sinuses (endoscopy) using an imaging device that has a light attached (endoscope). If your health care provider suspects that you have chronic sinusitis, one or more of the following tests may be recommended:  Allergy tests.  Nasal culture. A sample of mucus is taken from your nose, sent to a lab, and screened for bacteria.  Nasal cytology. A sample of mucus is taken from your nose and examined by your health care provider to determine if your sinusitis is related to an allergy. TREATMENT  Most cases of acute  sinusitis are related to a viral infection and will resolve on their own within 10 days. Sometimes medicines are prescribed to help relieve symptoms (pain medicine, decongestants, nasal steroid sprays, or saline sprays).  However, for sinusitis related to a bacterial infection, your health care provider will prescribe antibiotic medicines. These are medicines that will help kill the bacteria causing the infection.  Rarely, sinusitis is caused by a fungal infection. In theses cases, your health care provider will prescribe antifungal medicine. For some cases of chronic sinusitis, surgery is needed. Generally, these are cases in which sinusitis recurs more than 3 times per year, despite other treatments. HOME CARE INSTRUCTIONS   Drink plenty of water. Water helps thin the mucus so your sinuses can drain more easily.  Use a humidifier.  Inhale steam 3 to 4 times a day (for example, sit in the bathroom with the shower running).  Apply a warm, moist washcloth to your face 3 to 4 times a day, or as directed by your health care provider.  Use saline nasal sprays to help moisten and clean your sinuses.  Take medicines only as directed by your health care provider.  If you were prescribed either an antibiotic or antifungal medicine, finish it all even if you start to feel better. SEEK IMMEDIATE MEDICAL   CARE IF:  You have increasing pain or severe headaches.  You have nausea, vomiting, or drowsiness.  You have swelling around your face.  You have vision problems.  You have a stiff neck.  You have difficulty breathing. MAKE SURE YOU:   Understand these instructions.  Will watch your condition.  Will get help right away if you are not doing well or get worse. Document Released: 03/30/2005 Document Revised: 08/14/2013 Document Reviewed: 04/14/2011 Exit[employer] Patient Information 2015 [employer]. This information is not intended to replace advice given to you by your health care provider.  Make sure you discuss any questions you have with your health care provider.  

## 2015-01-12 NOTE — Progress Notes (Signed)
@[facility] @  This chart was scribed for Robyn Haber, MD by Thea Alken, ED Scribe. This patient was seen in room 13 and the patient's care was started at 12:58 PM.  Chief Complaint  Patient presents with   Sinus Problem    C/O sore throat, cough, & sinus congestion, & chest hurst when coughing, & dizziness x Since Wednesday. HAs tried sudafed PE, Tylenol/Ibuprofen,Nyquil & Dayquil    Patient ID: Allison Cunningham MRN: [id number], DOB: [birth year], 34 y.o. Date of Encounter: 01/12/2015, 12:58 PM  Primary Physician: Ma Rings, MD  Chief Complaint:  Chief Complaint  Patient presents with   Sinus Problem    C/O sore throat, cough, & sinus congestion, & chest hurst when coughing, & dizziness x Since Wednesday. HAs tried sudafed PE, Tylenol/Ibuprofen,Nyquil & Dayquil    HPI: 34 y.o. year old female with history below presents with sinus problems for 3 days. Pt reports fever, sinus pressure, congestion, worsening cough, ear fullness, nausea, and chest pain. States symptoms are worse at night. Has been taking tylenol, saline nasal spray, sudafed, Nyquil and Dayquil. Pt is smoker. Pt does not take birth control and reports having 2 children.   Pt works at [employer].   Past Medical History  Diagnosis Date   Anxiety    Palpitations    Dizziness    Pre-syncope    Syncope and collapse     has no happened in last year--last 2013     Home Meds: Prior to Admission medications   Medication Sig Start Date End Date Taking? Authorizing Provider  acetaminophen (TYLENOL) 325 MG tablet Take 650 mg by mouth every 6 (six) hours as needed. Patient used this medication for pain.   Yes Historical Provider, MD  ALPRAZolam Duanne Moron) 0.5 MG tablet Take 0.5 mg by mouth at bedtime as needed.   Yes Historical Provider, MD  atorvastatin (LIPITOR) 40 MG tablet Take 1 tablet (40 mg total) by mouth daily. 07/23/14  Yes Dorothy Spark, MD  carvedilol (COREG) 3.125 MG tablet  Take 1 tablet (3.125 mg total) by mouth 2 (two) times daily with a meal. 08/20/14  Yes Dorothy Spark, MD  cyclobenzaprine (FLEXERIL) 10 MG tablet Take 1 tablet (10 mg total) by mouth 3 (three) times daily as needed for muscle spasms. 10/08/14  Yes Sharion Balloon, FNP  diazepam (VALIUM) 5 MG tablet Take 5 mg by mouth every 6 (six) hours as needed for anxiety.   Yes Historical Provider, MD  fluticasone (VERAMYST) 27.5 MCG/SPRAY nasal spray Place 1 spray into the nose 2 (two) times daily. 09/16/14  Yes Benjamine Mola, FNP  ALPRAZolam Duanne Moron) 0.25 MG tablet Take one today before cornary ct Patient not taking: Reported on 01/12/2015 04/11/13   Dorothy Spark, MD    Allergies:  Allergies  Allergen Reactions   Bacitracin Anaphylaxis   Ibuprofen Other (See Comments)    GI upset in high dosage   Aspirin    Azithromycin Nausea And Vomiting   Clindamycin/Lincomycin    Doxycycline    Amoxicillin-Pot Clavulanate Rash   Penicillins Rash    Social History   Social History   Marital Status: Single    Spouse Name: N/A   Number of Children: N/A   Years of Education: N/A   Occupational History   Not on file.   Social History Main Topics   Smoking status: Current Every Day Smoker -- 1.00 packs/day for 17 years    Types: Cigarettes   Smokeless tobacco:  Never Used   Alcohol Use: No   Drug Use: No   Sexual Activity: Not Currently   Other Topics Concern   Not on file   Social History Narrative     Review of Systems: Constitutional: negative for chills, night sweats, weight changes, or fatigue  HEENT: negative for vision changes, hearing loss,  rhinorrhea,  Epistaxis. Cardiovascular: negative for  palpitations Respiratory: negative for hemoptysis, wheezing, shortness of breath, or cough Abdominal: negative for abdominal pain, nausea, vomiting, diarrhea, or constipation Dermatological: negative for rash Neurologic: negative for headache, dizziness, or syncope All  other systems reviewed and are otherwise negative with the exception to those above and in the HPI.   Physical Exam: Blood pressure 110/70, pulse 95, temperature 98.4 F (36.9 C), temperature source Oral, resp. rate 17, height 5\' 6"  (1.676 m), weight 213 lb 6 oz (96.786 kg), SpO2 95 %., Body mass index is 34.46 kg/(m^2). General: Well developed, well nourished, in no acute distress. Head: Normocephalic, atraumatic, eyes without discharge, sclera non-icteric, nares are without discharge. Bilateral auditory canals clear, TM's are without perforation, pearly grey and translucent with reflective cone of light bilaterally. Oral cavity moist, posterior pharynx without exudate, erythema, peritonsillar abscess, or post nasal drip.   Neck: Supple. No thyromegaly. Full ROM. No lymphadenopathy. Lungs: Clear bilaterally to auscultation without wheezes, rales, or rhonchi. Breathing is unlabored. Heart: RRR with S1 S2. No murmurs, rubs, or gallops appreciated. Abdomen: Soft, non-tender, non-distended with normoactive bowel sounds. No hepatomegaly. No rebound/guarding. No obvious abdominal masses. Msk:  Strength and tone normal for age. Extremities/Skin: Warm and dry. No clubbing or cyanosis. No edema. No rashes or suspicious lesions. Neuro: Alert and oriented X 3. Moves all extremities spontaneously. Gait is normal. CNII-XII grossly in tact. Psych:  Responds to questions appropriately with a normal affect.    ASSESSMENT AND PLAN:  34 y.o. year old female with bronchitis and sinusitis.  Patient advised to stop smoking  Note written by scribe.    ICD-9-CM ICD-10-CM   1. Acute recurrent maxillary sinusitis 461.0 J01.01 azithromycin (ZITHROMAX) 250 MG tablet     HYDROcodone-homatropine (HYCODAN) 5-1.5 MG/5ML syrup  2. Acute bronchitis, unspecified organism 466.0 J20.9 azithromycin (ZITHROMAX) 250 MG tablet     HYDROcodone-homatropine (HYCODAN) 5-1.5 MG/5ML syrup    By signing my name below, I, Raven  Small, attest that this documentation has been prepared under the direction and in the presence of Robyn Haber, MD.  Electronically Signed: Thea Alken, ED Scribe. 01/12/2015. 1:09 PM.  Signed, Robyn Haber, MD 01/12/2015 12:58 PM

## 2015-01-15 ENCOUNTER — Telehealth: Payer: Self-pay | Admitting: Nurse Practitioner

## 2015-01-15 MED ORDER — ALBUTEROL SULFATE HFA 108 (90 BASE) MCG/ACT IN AERS: 2.0000 | INHALATION_SPRAY | Freq: Four times a day (QID) | RESPIRATORY_TRACT | Status: DC | PRN

## 2015-01-15 NOTE — Telephone Encounter (Signed)
Will try albuterol HFA.     Return as needed.

## 2015-01-15 NOTE — Telephone Encounter (Signed)
Patient working in clinic with c/o difficulty breathing, SOB.  Hx asthma, does not have inhaler with her today.  Dr. Acie Fredrickson advised me to send Rx for patient to [facility] outpatient pharmacy.  Patient aware.

## 2015-01-23 ENCOUNTER — Emergency Department (INDEPENDENT_AMBULATORY_CARE_PROVIDER_SITE_OTHER)
Admission: EM | Admit: 2015-01-23 | Discharge: 2015-01-23 | Disposition: A | Discharge status: Home / Self Care | Payer: 59 | Source: Home / Self Care | Attending: Family Medicine | Admitting: Family Medicine

## 2015-01-23 ENCOUNTER — Encounter: Payer: Self-pay | Admitting: *Deleted

## 2015-01-23 DIAGNOSIS — W57XXXA Bitten or stung by nonvenomous insect and other nonvenomous arthropods, initial encounter: Secondary | ICD-10-CM | POA: Diagnosis not present

## 2015-01-23 DIAGNOSIS — T148 Other injury of unspecified body region: Secondary | ICD-10-CM

## 2015-01-23 MED ORDER — MUPIROCIN 2 % EX OINT: 1.0000 "application " | TOPICAL_OINTMENT | Freq: Two times a day (BID) | CUTANEOUS | Status: DC

## 2015-01-23 NOTE — Discharge Instructions (Signed)
You may used the topical antibiotic for 5 days.

## 2015-01-23 NOTE — ED Notes (Signed)
Patient reports being bitten by a spider 2-3 days ago in right ankle. Site is painful, open and draining. Otherwise asymptomatic.

## 2015-01-23 NOTE — ED Provider Notes (Signed)
CSN: [id number]     Arrival date & time 01/23/15  1541 History   First MD Initiated Contact with Patient 01/23/15 1555     Chief Complaint  Patient presents with  . Insect Bite   (Consider location/radiation/quality/duration/timing/severity/associated sxs/prior Treatment) HPI Pt is a 34yo female presenting to [facility] for evaluation of a spider bite on her Right anterior ankle.  Pt states she saw and killed the spider that bit her 2-3 days ago.  Wound has drained minimal amount of clear discharge.  Pain is aching and sore, mild in severity, worse with palpation. Denies fever, chills, n/v/d.   Past Medical History  Diagnosis Date  . Anxiety   . Palpitations   . Dizziness   . Pre-syncope   . Syncope and collapse     has no happened in last year--last 2013   Past Surgical History  Procedure Laterality Date  . Cesarean section      x2   Family History  Problem Relation Age of Onset  . Heart attack Mother   . Hypertension Mother   . Heart murmur Mother   . Heart disease Father   . Cirrhosis Father   . Hypertension Father   . Heart attack Maternal Grandmother   . Cervical cancer Maternal Grandmother   . Stroke Maternal Grandmother   . Hypertension Maternal Grandmother   . Hypertension Maternal Uncle   . Stroke Maternal Grandfather    Social History  Substance Use Topics  . Smoking status: Current Every Day Smoker -- 1.00 packs/day for 17 years    Types: Cigarettes  . Smokeless tobacco: Never Used  . Alcohol Use: No   OB History    Gravida Para Term Preterm AB TAB SAB Ectopic Multiple Living   3 2 2       1      Review of Systems  Constitutional: Negative for fever and chills.  Respiratory: Negative for shortness of breath.   Gastrointestinal: Negative for nausea, vomiting, abdominal pain and diarrhea.  Musculoskeletal: Negative for myalgias, joint swelling and arthralgias.  Skin: Positive for wound. Negative for color change.    Allergies  Bactrim; Doxycycline;  Ibuprofen; Aspirin; Azithromycin; Clindamycin/lincomycin; Amoxicillin-pot clavulanate; and Penicillins  Home Medications   Prior to Admission medications   Medication Sig Start Date End Date Taking? Authorizing Provider  acetaminophen (TYLENOL) 325 MG tablet Take 650 mg by mouth every 6 (six) hours as needed. Patient used this medication for pain.    Historical Provider, MD  albuterol (PROVENTIL HFA;VENTOLIN HFA) 108 (90 BASE) MCG/ACT inhaler Inhale 2 puffs into the lungs every 6 (six) hours as needed for wheezing or shortness of breath. 01/15/15   Thayer Headings, MD  atorvastatin (LIPITOR) 40 MG tablet Take 1 tablet (40 mg total) by mouth daily. 07/23/14   Dorothy Spark, MD  carvedilol (COREG) 3.125 MG tablet Take 1 tablet (3.125 mg total) by mouth 2 (two) times daily with a meal. 08/20/14   Dorothy Spark, MD  diazepam (VALIUM) 5 MG tablet Take 5 mg by mouth every 6 (six) hours as needed for anxiety.    Historical Provider, MD  fluticasone (VERAMYST) 27.5 MCG/SPRAY nasal spray Place 1 spray into the nose 2 (two) times daily. 09/16/14   Benjamine Mola, FNP  mupirocin ointment (BACTROBAN) 2 % Place 1 application into the nose 2 (two) times daily. 01/23/15   Noland Fordyce, PA-C   Meds Ordered and Administered this Visit  Medications - No data to display  BP 114/77  mmHg  Pulse 93  Temp(Src) 98.4 F (36.9 C) (Oral)  Resp 14  Ht 5\' 7"  (1.702 m)  Wt 215 lb (97.523 kg)  BMI 33.67 kg/m2  SpO2 95%  LMP 01/06/2015 No data found.   Physical Exam  Constitutional: She is oriented to person, place, and time. She appears well-developed and well-nourished.  HENT:  Head: Normocephalic and atraumatic.  Eyes: EOM are normal.  Neck: Normal range of motion.  Cardiovascular: Normal rate.   Pulmonary/Chest: Effort normal.  Musculoskeletal: Normal range of motion.  Neurological: She is alert and oriented to person, place, and time.  Skin: Skin is warm and dry. There is erythema.  Right anterior  ankle: 0.5cm circular erythematous lesion, scant clear discharge. Mild tenderness. No induration or fluctuance.   Psychiatric: She has a normal mood and affect. Her behavior is normal.  Nursing note and vitals reviewed.   ED Course  Procedures (including critical care time)  Labs Review Labs Reviewed - No data to display  Imaging Review No results found.     MDM   1. Insect bite     Pt reports spider bite on Right ankle. Wound draining small amount of clear liquid spontaneously. No induration or fluctuance. In indication for I&D at this time.  Pt appears well, non-toxic. Afebrile. No indication for oral antibiotics. Will tx with mupirocin.  F/u with PCP in 3-4 days for wound recheck if not improving, sooner if worsening. Patient verbalized understanding and agreement with treatment plan.    Noland Fordyce, PA-C 01/23/15 [phone]

## 2015-01-26 ENCOUNTER — Telehealth: Payer: Self-pay | Admitting: Emergency Medicine

## 2015-01-30 ENCOUNTER — Telehealth: Payer: Self-pay | Admitting: *Deleted

## 2015-01-30 DIAGNOSIS — E785 Hyperlipidemia, unspecified: Secondary | ICD-10-CM

## 2015-01-30 NOTE — Telephone Encounter (Signed)
This is a pt of Dr [name] who will need fasting lipids and a cmet done on 02/01/15 to re-eval statin dosage, before refilling med.  This is per Dr [name]. Pt is aware that her lab appt is for 10/21 at our office, and to come fasting to this lab appt.  Pt verbalized understanding and agrees with this plan.

## 2015-02-01 ENCOUNTER — Other Ambulatory Visit (INDEPENDENT_AMBULATORY_CARE_PROVIDER_SITE_OTHER): Payer: 59 | Admitting: *Deleted

## 2015-02-01 DIAGNOSIS — E785 Hyperlipidemia, unspecified: Secondary | ICD-10-CM

## 2015-02-01 LAB — LIPID PANEL
Cholesterol: 187 mg/dL (ref 125–200)
HDL: 27 mg/dL — ABNORMAL LOW (ref >=46)
LDL Cholesterol: 97 mg/dL (ref <130)
Total CHOL/HDL Ratio: 6.9 Ratio — ABNORMAL HIGH (ref <=5.0)
Triglycerides: 317 mg/dL — ABNORMAL HIGH (ref <150)
VLDL: 63 mg/dL — ABNORMAL HIGH (ref <30)

## 2015-02-01 LAB — COMPREHENSIVE METABOLIC PANEL
ALT: 13 U/L (ref 6–29)
AST: 17 U/L (ref 10–30)
Albumin: 3.6 g/dL (ref 3.6–5.1)
Alkaline Phosphatase: 47 U/L (ref 33–115)
BUN: 12 mg/dL (ref 7–25)
CO2: 22 mmol/L (ref 20–31)
Calcium: 8.8 mg/dL (ref 8.6–10.2)
Chloride: 106 mmol/L (ref 98–110)
Creat: 0.5 mg/dL (ref 0.50–1.10)
Glucose, Bld: 79 mg/dL (ref 65–99)
Potassium: 3.6 mmol/L (ref 3.5–5.3)
Sodium: 139 mmol/L (ref 135–146)
Total Bilirubin: 0.5 mg/dL (ref 0.2–1.2)
Total Protein: 6.6 g/dL (ref 6.1–8.1)

## 2015-02-04 ENCOUNTER — Telehealth: Payer: Self-pay | Admitting: *Deleted

## 2015-02-04 MED ORDER — CARVEDILOL 3.125 MG PO TABS: 3.1250 mg | ORAL_TABLET | Freq: Two times a day (BID) | ORAL | Status: DC

## 2015-02-04 MED ORDER — ATORVASTATIN CALCIUM 40 MG PO TABS: 40.0000 mg | ORAL_TABLET | Freq: Every day | ORAL | Status: DC

## 2015-02-04 NOTE — Telephone Encounter (Signed)
Great, agree with restarting statins, please refill carvedilol

## 2015-02-04 NOTE — Telephone Encounter (Signed)
Notified the pt that per Dr [name], based on her labs, if she is not taking her statins, she should at least try to take fenofibrate 160 mg po daily, for her triglycerides are still very high.  Per the pt she request to start back taking her statins, and being more compliant with this.  Pt states she has a strong family history and will definitely take her statin exactly as Dr [name] has prescribed, with no missed doses.  Pt states she will also need a refill on her carvedilol.  Confirmed the pharmacy of choice with the pt.  Pt request to inform Dr [name] that she wants to proceed with taking her current dose of statin and hold off on the fenofibrate for now.  Informed the pt I will route this message to her to make her aware for her decision and refill on carvedilol.  Pt verbalized understanding, agrees with this plan, and gracious for all the assistance provided.

## 2015-02-04 NOTE — Telephone Encounter (Signed)
-----   Message from Dorothy Spark, MD sent at 02/04/2015  8:56 AM EDT ----- Please talk to [facility], if she is not taking statins, she should at least try to take fenofibrate 160 mg po daily. Her TG are still very high

## 2015-04-19 ENCOUNTER — Telehealth: Payer: 59 | Admitting: Nurse Practitioner

## 2015-04-19 DIAGNOSIS — J0101 Acute recurrent maxillary sinusitis: Secondary | ICD-10-CM

## 2015-04-19 MED ORDER — AZITHROMYCIN 250 MG PO TABS: ORAL_TABLET | ORAL | Status: DC

## 2015-04-19 MED FILL — AZITHROMYCIN 250 MG TABLET: 250 | 5 days supply | Qty: 6 | Fill #0

## 2015-04-19 NOTE — Progress Notes (Unsigned)
We are sorry that you are not feeling well.  Here is how we plan to help!  Based on what you have shared with me it looks like you have sinusitis.  Sinusitis is inflammation and infection in the sinus cavities of the head.  Based on your presentation I believe you most likely have Acute Bacterial sinusitis.  This is an infection caused by bacteria and is treated with antibiotics.  I have prescribed z paK, an antibiotic in the macrolide family, to be taken as directed.  You may use an oral decongestant such as Mucinex D or if you have glaucoma or high blood pressure use plain Mucinex.  Saline nasal sprays help an can sefely be used as often as needed for congestion.  If you develop worsening sinus pain, fever or notice severe headache and vision changes, or if symptoms are not better after completion of antibiotic, please schedule an appointment with a health care provider.  Sinus infections are not as easily transmitted as other respiratory infection, however we still recommend that you avoid close contact with loved ones, especially the very young and elderly.  Remember to wash your hands thoroughly throughout the day as this is the number one way to prevent the spread of infection!  Home Care:  Only take medications as instructed by your medical team.  Complete the entire course of an antibiotic.  Do not take these medications with alcohol.  A steam or ultrasonic humidifier can help congestion.  You can place a towel over your head and breathe in the steam from hot water coming from a faucet.  Avoid close contacts especially the very young and the elderly.  Cover your mouth when you cough or sneeze.  Always remember to wash your hands.  Get Help Right Away If:  You develop worsening fever or sinus pain.  You develop a severe head ache or visual changes.  Your symptoms persist after you have completed your treatment plan.  Make sure you  Understand these instructions.  Will watch  your condition.  Will get help right away if you are not doing well or get worse.  Your e-visit answers were reviewed by a board certified advanced clinical practitioner to complete your personal care plan.  Depending on the condition, your plan could have included both over the counter or prescription medications.  If there is a problem please reply  once you have received a response from your provider.  Your safety is important to [place].  If you have drug allergies check your prescription carefully.    You can use MyChart to ask questions about today's visit, request a non-urgent call back, or ask for a work or school excuse.  You will get an e-mail in the next two days asking about your experience.  I hope that your e-visit has been valuable and will speed your recovery. Thank you for using e-visits.   Patient stated not allergic to zpac and that is what works for her.

## 2015-04-29 DIAGNOSIS — R440 Auditory hallucinations: Secondary | ICD-10-CM | POA: Diagnosis not present

## 2015-04-29 DIAGNOSIS — F39 Unspecified mood [affective] disorder: Secondary | ICD-10-CM | POA: Diagnosis not present

## 2015-04-30 DIAGNOSIS — F429 Obsessive-compulsive disorder, unspecified: Secondary | ICD-10-CM | POA: Diagnosis not present

## 2015-04-30 DIAGNOSIS — F4310 Post-traumatic stress disorder, unspecified: Secondary | ICD-10-CM | POA: Diagnosis not present

## 2015-05-14 MED FILL — clonazePAM 0.5 MG TABS: 0.5 | 30 days supply | Qty: 60 | Fill #0

## 2015-06-06 DIAGNOSIS — F250 Schizoaffective disorder, bipolar type: Secondary | ICD-10-CM | POA: Diagnosis not present

## 2015-06-06 DIAGNOSIS — F410 Panic disorder [episodic paroxysmal anxiety] without agoraphobia: Secondary | ICD-10-CM | POA: Diagnosis not present

## 2015-06-06 DIAGNOSIS — F429 Obsessive-compulsive disorder, unspecified: Secondary | ICD-10-CM | POA: Diagnosis not present

## 2015-06-06 MED FILL — ARIPiprazole 5 MG TABS: 5 | 30 days supply | Qty: 30 | Fill #0

## 2015-06-11 ENCOUNTER — Telehealth: Payer: 59 | Admitting: Family

## 2015-06-11 ENCOUNTER — Telehealth: Payer: Self-pay | Admitting: *Deleted

## 2015-06-11 DIAGNOSIS — J029 Acute pharyngitis, unspecified: Secondary | ICD-10-CM | POA: Diagnosis not present

## 2015-06-11 DIAGNOSIS — J028 Acute pharyngitis due to other specified organisms: Principal | ICD-10-CM

## 2015-06-11 DIAGNOSIS — E785 Hyperlipidemia, unspecified: Secondary | ICD-10-CM

## 2015-06-11 DIAGNOSIS — B9689 Other specified bacterial agents as the cause of diseases classified elsewhere: Secondary | ICD-10-CM

## 2015-06-11 MED ORDER — AZITHROMYCIN 250 MG PO TABS: ORAL_TABLET | ORAL | Status: DC

## 2015-06-11 MED FILL — AZITHROMYCIN 250 MG TABLET: 250 | 5 days supply | Qty: 6 | Fill #0

## 2015-06-11 NOTE — Telephone Encounter (Signed)
Notified the pt that per Dr [name], she would like to have her come in to have fasting lipids and a cmet drawn.  Per the pt, she request for her lab appt to be scheduled for this Friday 06/14/15, at our office, to check a cmet and lipids.  Pt is aware to come fasting to this lab appt.  Pt verbalized understanding and agrees with this plan.

## 2015-06-11 NOTE — Telephone Encounter (Signed)
Pt wants Dr [name] to know that she stopped taking Lipitor, because its causing her lower extremities to ache.  Pt also wanted to let Dr [name] that her Psychiatrist started her on a new medication, Abilify, and was instructed to make Dr [name] aware of this, for this medication can cause your cholesterol to elevate. Psychiatrist recommended that we check another fasting lipid on the pt and advise on a different regimen besides Lipitor.  Pt recalls that Dr [name] did mention at one time starting her on fenofibrate vs lipitor.  Informed the pt that I will route this message to her for further review and recommendation and follow-up with her thereafter.  Pt verbalized understanding and agrees with this plan.

## 2015-06-11 NOTE — Progress Notes (Signed)

## 2015-06-11 NOTE — Telephone Encounter (Signed)
Ivy, Please schedule fasting lipid panel and CMP. Thank you, [place] Siren

## 2015-06-13 MED FILL — clonazePAM 0.5 MG TABS: 0.5 | 30 days supply | Qty: 60 | Fill #0

## 2015-06-14 ENCOUNTER — Other Ambulatory Visit (INDEPENDENT_AMBULATORY_CARE_PROVIDER_SITE_OTHER): Payer: 59 | Admitting: *Deleted

## 2015-06-14 DIAGNOSIS — E785 Hyperlipidemia, unspecified: Secondary | ICD-10-CM

## 2015-06-14 LAB — COMPREHENSIVE METABOLIC PANEL
ALT: 16 U/L (ref 6–29)
AST: 18 U/L (ref 10–30)
Albumin: 4 g/dL (ref 3.6–5.1)
Alkaline Phosphatase: 58 U/L (ref 33–115)
BUN: 14 mg/dL (ref 7–25)
CO2: 23 mmol/L (ref 20–31)
Calcium: 9 mg/dL (ref 8.6–10.2)
Chloride: 104 mmol/L (ref 98–110)
Creat: 0.6 mg/dL (ref 0.50–1.10)
Glucose, Bld: 99 mg/dL (ref 65–99)
Potassium: 4.1 mmol/L (ref 3.5–5.3)
Sodium: 136 mmol/L (ref 135–146)
Total Bilirubin: 0.4 mg/dL (ref 0.2–1.2)
Total Protein: 6.5 g/dL (ref 6.1–8.1)

## 2015-06-14 LAB — LIPID PANEL
Cholesterol: 174 mg/dL (ref 125–200)
HDL: 25 mg/dL — ABNORMAL LOW (ref >=46)
LDL Cholesterol: 78 mg/dL (ref <130)
Total CHOL/HDL Ratio: 7 Ratio — ABNORMAL HIGH (ref <=5.0)
Triglycerides: 353 mg/dL — ABNORMAL HIGH (ref <150)
VLDL: 71 mg/dL — ABNORMAL HIGH (ref <30)

## 2015-06-14 MED FILL — risperiDONE 0.5 MG TABS: 0.5 | 30 days supply | Qty: 60 | Fill #0

## 2015-06-17 ENCOUNTER — Telehealth: Payer: Self-pay | Admitting: Pharmacist

## 2015-06-17 MED ORDER — OMEGA-3-ACID ETHYL ESTERS 1 G PO CAPS: 2.0000 g | ORAL_CAPSULE | Freq: Two times a day (BID) | ORAL | Status: DC

## 2015-06-17 MED ORDER — ROSUVASTATIN CALCIUM 5 MG PO TABS: 5.0000 mg | ORAL_TABLET | Freq: Every day | ORAL | Status: DC

## 2015-06-17 MED FILL — OMEGA-3 ETHYL ESTERS 1 GM C: 1 | 30 days supply | Qty: 120 | Fill #0

## 2015-06-17 MED FILL — ROSUVASTATIN CALCIUM 5 MG T: 5 | 30 days supply | Qty: 30 | Fill #0

## 2015-06-17 NOTE — Telephone Encounter (Signed)
Dr. [name] asked for my review of pt's cholesterol panel from 3/3.  She has a strong family history of CAD.  She had been prescribed Lipitor 40mg  daily and Lovaza in the past but had to stop Lipitor due to myalgias.  She did try to decrease to 20mg  daily with no relief.  She also stopped Lovaza at some point during the past year but not due to side effects.    Labs from 3/3: TC- 174, TG 353, HDL 25, LDL 78, LFTs normal  Reviewed possible causes for elevated TG.  May be related to antipsychotic use.  She had a good response to Lovaza in the past so will restart.  She would also benefit from a statin given family history and elevated LDL-P and lp(a).  Suggested she start with Crestor 5mg  three days of the week.  She will let us know how she is tolerating this therapy.

## 2015-06-24 DIAGNOSIS — F410 Panic disorder [episodic paroxysmal anxiety] without agoraphobia: Secondary | ICD-10-CM | POA: Diagnosis not present

## 2015-06-24 DIAGNOSIS — F259 Schizoaffective disorder, unspecified: Secondary | ICD-10-CM | POA: Diagnosis not present

## 2015-06-24 DIAGNOSIS — F429 Obsessive-compulsive disorder, unspecified: Secondary | ICD-10-CM | POA: Diagnosis not present

## 2015-06-24 MED FILL — risperiDONE 1 MG TABS: 1 | 30 days supply | Qty: 75 | Fill #0

## 2015-06-25 MED FILL — CARVEDILOL 3.125 MG TABLET: 3.125 | 90 days supply | Qty: 180 | Fill #0

## 2015-07-04 DIAGNOSIS — F250 Schizoaffective disorder, bipolar type: Secondary | ICD-10-CM | POA: Diagnosis not present

## 2015-07-04 DIAGNOSIS — F429 Obsessive-compulsive disorder, unspecified: Secondary | ICD-10-CM | POA: Diagnosis not present

## 2015-07-04 DIAGNOSIS — F410 Panic disorder [episodic paroxysmal anxiety] without agoraphobia: Secondary | ICD-10-CM | POA: Diagnosis not present

## 2015-07-04 MED FILL — clonazePAM 0.5 MG TABS: 0.5 | 30 days supply | Qty: 75 | Fill #0

## 2015-07-17 DIAGNOSIS — F429 Obsessive-compulsive disorder, unspecified: Secondary | ICD-10-CM | POA: Diagnosis not present

## 2015-07-17 DIAGNOSIS — F410 Panic disorder [episodic paroxysmal anxiety] without agoraphobia: Secondary | ICD-10-CM | POA: Diagnosis not present

## 2015-07-17 DIAGNOSIS — F250 Schizoaffective disorder, bipolar type: Secondary | ICD-10-CM | POA: Diagnosis not present

## 2015-07-17 MED FILL — risperiDONE 1 MG TABS: 1 | 30 days supply | Qty: 120 | Fill #0

## 2015-07-17 MED FILL — ROSUVASTATIN CALCIUM 5 MG T: 5 | 30 days supply | Qty: 30 | Fill #1

## 2015-08-01 DIAGNOSIS — F410 Panic disorder [episodic paroxysmal anxiety] without agoraphobia: Secondary | ICD-10-CM | POA: Diagnosis not present

## 2015-08-01 DIAGNOSIS — F429 Obsessive-compulsive disorder, unspecified: Secondary | ICD-10-CM | POA: Diagnosis not present

## 2015-08-01 DIAGNOSIS — F250 Schizoaffective disorder, bipolar type: Secondary | ICD-10-CM | POA: Diagnosis not present

## 2015-08-01 MED FILL — lamoTRIgine 25 MG TABS: 25 | 30 days supply | Qty: 60 | Fill #0

## 2015-08-01 MED FILL — clonazePAM 0.5 MG TABS: 0.5 | 30 days supply | Qty: 75 | Fill #0

## 2015-08-19 DIAGNOSIS — F250 Schizoaffective disorder, bipolar type: Secondary | ICD-10-CM | POA: Diagnosis not present

## 2015-08-19 DIAGNOSIS — F410 Panic disorder [episodic paroxysmal anxiety] without agoraphobia: Secondary | ICD-10-CM | POA: Diagnosis not present

## 2015-08-19 DIAGNOSIS — F429 Obsessive-compulsive disorder, unspecified: Secondary | ICD-10-CM | POA: Diagnosis not present

## 2015-08-20 MED FILL — risperiDONE 1 MG TABS: 1 | 90 days supply | Qty: 360 | Fill #0

## 2015-08-21 MED FILL — clonazePAM 0.5 MG TABS: 0.5 | 30 days supply | Qty: 60 | Fill #0

## 2015-08-26 MED FILL — lamoTRIgine 25 MG TABS: 25 | 90 days supply | Qty: 180 | Fill #0

## 2015-09-12 MED FILL — traZODone HCL 50 MG TABS: 50 | 30 days supply | Qty: 30 | Fill #0

## 2015-09-18 MED FILL — hydrOXYzine HCL 50 MG TABS: 50 | 23 days supply | Qty: 45 | Fill #0

## 2015-09-27 DIAGNOSIS — G4700 Insomnia, unspecified: Secondary | ICD-10-CM | POA: Diagnosis not present

## 2015-09-27 DIAGNOSIS — F250 Schizoaffective disorder, bipolar type: Secondary | ICD-10-CM | POA: Diagnosis not present

## 2015-09-27 DIAGNOSIS — F429 Obsessive-compulsive disorder, unspecified: Secondary | ICD-10-CM | POA: Diagnosis not present

## 2015-09-27 MED FILL — lamoTRIgine 100 MG TABS: 100 | 30 days supply | Qty: 30 | Fill #0

## 2015-09-27 MED FILL — clonazePAM 0.5 MG TABS: 0.5 | 30 days supply | Qty: 60 | Fill #0

## 2015-10-31 DIAGNOSIS — Z79899 Other long term (current) drug therapy: Secondary | ICD-10-CM | POA: Diagnosis not present

## 2015-10-31 DIAGNOSIS — F250 Schizoaffective disorder, bipolar type: Secondary | ICD-10-CM | POA: Diagnosis not present

## 2015-10-31 DIAGNOSIS — F429 Obsessive-compulsive disorder, unspecified: Secondary | ICD-10-CM | POA: Diagnosis not present

## 2015-10-31 DIAGNOSIS — F410 Panic disorder [episodic paroxysmal anxiety] without agoraphobia: Secondary | ICD-10-CM | POA: Diagnosis not present

## 2015-10-31 MED FILL — OLANZapine 5 MG TABS: 5 | 30 days supply | Qty: 30 | Fill #0

## 2015-10-31 MED FILL — clonazePAM 0.5 MG TABS: 0.5 | 30 days supply | Qty: 60 | Fill #0

## 2015-11-01 MED FILL — LATUDA 40 MG TABLET: 40 | 30 days supply | Qty: 30 | Fill #0

## 2015-11-20 MED FILL — lamoTRIgine 100 MG TABS: 100 | 30 days supply | Qty: 30 | Fill #1

## 2015-11-28 DIAGNOSIS — R51 Headache: Secondary | ICD-10-CM | POA: Diagnosis not present

## 2015-11-28 DIAGNOSIS — F429 Obsessive-compulsive disorder, unspecified: Secondary | ICD-10-CM | POA: Diagnosis not present

## 2015-11-28 DIAGNOSIS — E785 Hyperlipidemia, unspecified: Secondary | ICD-10-CM | POA: Diagnosis not present

## 2015-11-28 DIAGNOSIS — G4700 Insomnia, unspecified: Secondary | ICD-10-CM | POA: Diagnosis not present

## 2015-11-28 DIAGNOSIS — R440 Auditory hallucinations: Secondary | ICD-10-CM | POA: Diagnosis not present

## 2015-11-28 DIAGNOSIS — F17210 Nicotine dependence, cigarettes, uncomplicated: Secondary | ICD-10-CM | POA: Diagnosis not present

## 2015-11-28 DIAGNOSIS — F329 Major depressive disorder, single episode, unspecified: Secondary | ICD-10-CM | POA: Diagnosis not present

## 2015-11-28 DIAGNOSIS — F419 Anxiety disorder, unspecified: Secondary | ICD-10-CM | POA: Diagnosis not present

## 2015-11-28 DIAGNOSIS — R443 Hallucinations, unspecified: Secondary | ICD-10-CM | POA: Diagnosis not present

## 2015-11-28 DIAGNOSIS — Z886 Allergy status to analgesic agent status: Secondary | ICD-10-CM | POA: Diagnosis not present

## 2015-11-28 DIAGNOSIS — F23 Brief psychotic disorder: Secondary | ICD-10-CM | POA: Diagnosis not present

## 2015-11-28 DIAGNOSIS — F410 Panic disorder [episodic paroxysmal anxiety] without agoraphobia: Secondary | ICD-10-CM | POA: Diagnosis not present

## 2015-11-28 DIAGNOSIS — F250 Schizoaffective disorder, bipolar type: Secondary | ICD-10-CM | POA: Diagnosis not present

## 2015-11-28 DIAGNOSIS — R5383 Other fatigue: Secondary | ICD-10-CM | POA: Diagnosis not present

## 2015-11-28 DIAGNOSIS — Z880 Allergy status to penicillin: Secondary | ICD-10-CM | POA: Diagnosis not present

## 2015-11-28 DIAGNOSIS — Z881 Allergy status to other antibiotic agents status: Secondary | ICD-10-CM | POA: Diagnosis not present

## 2015-11-29 DIAGNOSIS — E785 Hyperlipidemia, unspecified: Secondary | ICD-10-CM | POA: Diagnosis not present

## 2015-11-29 DIAGNOSIS — G4700 Insomnia, unspecified: Secondary | ICD-10-CM | POA: Diagnosis not present

## 2015-11-29 DIAGNOSIS — F250 Schizoaffective disorder, bipolar type: Secondary | ICD-10-CM | POA: Diagnosis not present

## 2015-11-29 DIAGNOSIS — I10 Essential (primary) hypertension: Secondary | ICD-10-CM | POA: Diagnosis not present

## 2015-11-29 DIAGNOSIS — E7800 Pure hypercholesterolemia, unspecified: Secondary | ICD-10-CM | POA: Diagnosis not present

## 2015-11-29 DIAGNOSIS — F419 Anxiety disorder, unspecified: Secondary | ICD-10-CM | POA: Diagnosis not present

## 2015-11-29 DIAGNOSIS — R45851 Suicidal ideations: Secondary | ICD-10-CM | POA: Diagnosis not present

## 2015-11-29 DIAGNOSIS — F17210 Nicotine dependence, cigarettes, uncomplicated: Secondary | ICD-10-CM | POA: Diagnosis not present

## 2015-11-29 DIAGNOSIS — Z886 Allergy status to analgesic agent status: Secondary | ICD-10-CM | POA: Diagnosis not present

## 2015-11-29 DIAGNOSIS — F429 Obsessive-compulsive disorder, unspecified: Secondary | ICD-10-CM | POA: Diagnosis not present

## 2015-11-29 DIAGNOSIS — Z880 Allergy status to penicillin: Secondary | ICD-10-CM | POA: Diagnosis not present

## 2015-11-29 DIAGNOSIS — F410 Panic disorder [episodic paroxysmal anxiety] without agoraphobia: Secondary | ICD-10-CM | POA: Diagnosis not present

## 2015-11-29 DIAGNOSIS — F319 Bipolar disorder, unspecified: Secondary | ICD-10-CM | POA: Diagnosis not present

## 2015-11-29 DIAGNOSIS — Z881 Allergy status to other antibiotic agents status: Secondary | ICD-10-CM | POA: Diagnosis not present

## 2015-11-29 DIAGNOSIS — F329 Major depressive disorder, single episode, unspecified: Secondary | ICD-10-CM | POA: Diagnosis not present

## 2015-11-29 DIAGNOSIS — F17200 Nicotine dependence, unspecified, uncomplicated: Secondary | ICD-10-CM | POA: Diagnosis not present

## 2015-11-29 DIAGNOSIS — F251 Schizoaffective disorder, depressive type: Secondary | ICD-10-CM | POA: Diagnosis not present

## 2015-12-03 MED FILL — LATUDA 80 MG TABLET: 80 | 30 days supply | Qty: 30 | Fill #0

## 2015-12-04 DIAGNOSIS — F251 Schizoaffective disorder, depressive type: Secondary | ICD-10-CM | POA: Diagnosis not present

## 2015-12-11 MED FILL — clonazePAM 0.5 MG TABS: 0.5 | 30 days supply | Qty: 60 | Fill #0

## 2015-12-11 MED FILL — hydrOXYzine HCL 50 MG TABS: 50 | 30 days supply | Qty: 60 | Fill #0

## 2016-01-28 MED FILL — lamoTRIgine 100 MG TABS: 100 | 30 days supply | Qty: 30 | Fill #2

## 2016-02-04 MED FILL — clonazePAM 0.5 MG TABS: 0.5 | 30 days supply | Qty: 60 | Fill #0

## 2016-02-04 MED FILL — LATUDA 60 MG TABLET: 60 | 30 days supply | Qty: 30 | Fill #0

## 2016-03-24 DIAGNOSIS — F429 Obsessive-compulsive disorder, unspecified: Secondary | ICD-10-CM | POA: Diagnosis not present

## 2016-03-24 DIAGNOSIS — F410 Panic disorder [episodic paroxysmal anxiety] without agoraphobia: Secondary | ICD-10-CM | POA: Diagnosis not present

## 2016-03-24 DIAGNOSIS — F250 Schizoaffective disorder, bipolar type: Secondary | ICD-10-CM | POA: Diagnosis not present

## 2016-03-24 DIAGNOSIS — G4700 Insomnia, unspecified: Secondary | ICD-10-CM | POA: Diagnosis not present

## 2016-03-24 MED FILL — hydrOXYzine HCL 50 MG TABS: 50 | 30 days supply | Qty: 60 | Fill #0

## 2016-03-24 MED FILL — clonazePAM 0.5 MG TABS: 0.5 | 30 days supply | Qty: 60 | Fill #0

## 2016-03-24 MED FILL — PALIPERIDONE ER 3 MG TABLET: 3 | 30 days supply | Qty: 30 | Fill #0

## 2016-03-26 DIAGNOSIS — F17210 Nicotine dependence, cigarettes, uncomplicated: Secondary | ICD-10-CM | POA: Diagnosis not present

## 2016-03-26 DIAGNOSIS — E784 Other hyperlipidemia: Secondary | ICD-10-CM | POA: Diagnosis not present

## 2016-03-26 DIAGNOSIS — J069 Acute upper respiratory infection, unspecified: Secondary | ICD-10-CM | POA: Diagnosis not present

## 2016-03-30 ENCOUNTER — Telehealth: Payer: 59 | Admitting: Family

## 2016-03-30 DIAGNOSIS — B9689 Other specified bacterial agents as the cause of diseases classified elsewhere: Secondary | ICD-10-CM

## 2016-03-30 DIAGNOSIS — J329 Chronic sinusitis, unspecified: Secondary | ICD-10-CM

## 2016-03-30 MED ORDER — AZITHROMYCIN 250 MG PO TABS: ORAL_TABLET | ORAL | 0 refills | Status: DC

## 2016-03-30 NOTE — Progress Notes (Signed)
We are sorry that you are not feeling well.  Here is how we plan to help!  Based on what you have shared with me it looks like you have sinusitis.  Sinusitis is inflammation and infection in the sinus cavities of the head.  Based on your presentation I believe you most likely have Acute Bacterial Sinusitis.  This is an infection caused by bacteria and is treated with antibiotics. I have prescribed Azithrymycin (Z pak). You may use an oral decongestant such as Mucinex D or if you have glaucoma or high blood pressure use plain Mucinex. Saline nasal spray help and can safely be used as often as needed for congestion.  If you develop worsening sinus pain, fever or notice severe headache and vision changes, or if symptoms are not better after completion of antibiotic, please schedule an appointment with a health care provider.    Sinus infections are not as easily transmitted as other respiratory infection, however we still recommend that you avoid close contact with loved ones, especially the very young and elderly.  Remember to wash your hands thoroughly throughout the day as this is the number one way to prevent the spread of infection!  Home Care:  Only take medications as instructed by your medical team.  Complete the entire course of an antibiotic.  Do not take these medications with alcohol.  A steam or ultrasonic humidifier can help congestion.  You can place a towel over your head and breathe in the steam from hot water coming from a faucet.  Avoid close contacts especially the very young and the elderly.  Cover your mouth when you cough or sneeze.  Always remember to wash your hands.  Get Help Right Away If:  You develop worsening fever or sinus pain.  You develop a severe head ache or visual changes.  Your symptoms persist after you have completed your treatment plan.  Make sure you  Understand these instructions.  Will watch your condition.  Will get help right away if you  are not doing well or get worse.  Your e-visit answers were reviewed by a board certified advanced clinical practitioner to complete your personal care plan.  Depending on the condition, your plan could have included both over the counter or prescription medications.  If there is a problem please reply  once you have received a response from your provider.  Your safety is important to [place].  If you have drug allergies check your prescription carefully.    You can use MyChart to ask questions about today's visit, request a non-urgent call back, or ask for a work or school excuse for 24 hours related to this e-Visit. If it has been greater than 24 hours you will need to follow up with your provider, or enter a new e-Visit to address those concerns.  You will get an e-mail in the next two days asking about your experience.  I hope that your e-visit has been valuable and will speed your recovery. Thank you for using e-visits.

## 2016-03-31 MED FILL — AZITHROMYCIN 250 MG TABLET: 250 | 5 days supply | Qty: 6 | Fill #0

## 2016-04-02 MED FILL — FENOFIBRATE 160 MG TABLET: 160 | 30 days supply | Qty: 30 | Fill #0

## 2016-04-20 MED FILL — PALIPERIDONE ER 3 MG TABLET: 3 | 30 days supply | Qty: 30 | Fill #1

## 2016-05-01 ENCOUNTER — Telehealth: Payer: 59 | Admitting: Family

## 2016-05-01 DIAGNOSIS — J329 Chronic sinusitis, unspecified: Secondary | ICD-10-CM

## 2016-05-01 DIAGNOSIS — B9689 Other specified bacterial agents as the cause of diseases classified elsewhere: Secondary | ICD-10-CM | POA: Diagnosis not present

## 2016-05-01 MED ORDER — AZITHROMYCIN 250 MG PO TABS: ORAL_TABLET | ORAL | 0 refills | Status: DC

## 2016-05-01 MED FILL — AZITHROMYCIN 250 MG TABLET: 250 | 5 days supply | Qty: 6 | Fill #0

## 2016-05-01 NOTE — Progress Notes (Signed)
We are sorry that you are not feeling well.  Here is how we plan to help!  Based on what you have shared with me it looks like you have sinusitis.  Sinusitis is inflammation and infection in the sinus cavities of the head.  Based on your presentation I believe you most likely have Acute Bacterial Sinusitis.  This is an infection caused by bacteria and is treated with antibiotics. I have prescribed a Z-pack.  You may use an oral decongestant such as Mucinex D or if you have glaucoma or high blood pressure use plain Mucinex. Saline nasal spray help and can safely be used as often as needed for congestion.  If you develop worsening sinus pain, fever or notice severe headache and vision changes, or if symptoms are not better after completion of antibiotic, please schedule an appointment with a health care provider.    Sinus infections are not as easily transmitted as other respiratory infection, however we still recommend that you avoid close contact with loved ones, especially the very young and elderly.  Remember to wash your hands thoroughly throughout the day as this is the number one way to prevent the spread of infection!  Home Care:  Only take medications as instructed by your medical team.  Complete the entire course of an antibiotic.  Do not take these medications with alcohol.  A steam or ultrasonic humidifier can help congestion.  You can place a towel over your head and breathe in the steam from hot water coming from a faucet.  Avoid close contacts especially the very young and the elderly.  Cover your mouth when you cough or sneeze.  Always remember to wash your hands.  Get Help Right Away If:  You develop worsening fever or sinus pain.  You develop a severe head ache or visual changes.  Your symptoms persist after you have completed your treatment plan.  Make sure you  Understand these instructions.  Will watch your condition.  Will get help right away if you are not  doing well or get worse.  Your e-visit answers were reviewed by a board certified advanced clinical practitioner to complete your personal care plan.  Depending on the condition, your plan could have included both over the counter or prescription medications.  If there is a problem please reply  once you have received a response from your provider.  Your safety is important to [place].  If you have drug allergies check your prescription carefully.    You can use MyChart to ask questions about today's visit, request a non-urgent call back, or ask for a work or school excuse for 24 hours related to this e-Visit. If it has been greater than 24 hours you will need to follow up with your provider, or enter a new e-Visit to address those concerns.  You will get an e-mail in the next two days asking about your experience.  I hope that your e-visit has been valuable and will speed your recovery. Thank you for using e-visits.

## 2016-05-11 DIAGNOSIS — G2401 Drug induced subacute dyskinesia: Secondary | ICD-10-CM | POA: Diagnosis not present

## 2016-05-11 DIAGNOSIS — F250 Schizoaffective disorder, bipolar type: Secondary | ICD-10-CM | POA: Diagnosis not present

## 2016-05-11 DIAGNOSIS — F410 Panic disorder [episodic paroxysmal anxiety] without agoraphobia: Secondary | ICD-10-CM | POA: Diagnosis not present

## 2016-05-11 DIAGNOSIS — F429 Obsessive-compulsive disorder, unspecified: Secondary | ICD-10-CM | POA: Diagnosis not present

## 2016-05-11 DIAGNOSIS — Z79899 Other long term (current) drug therapy: Secondary | ICD-10-CM | POA: Diagnosis not present

## 2016-05-11 MED FILL — lamoTRIgine 25 MG TABS: 25 | 30 days supply | Qty: 60 | Fill #0

## 2016-05-11 MED FILL — clonazePAM 0.5 MG TABS: 0.5 | 30 days supply | Qty: 75 | Fill #0

## 2016-05-22 MED FILL — PALIPERIDONE ER 3 MG TABLET: 3 | 30 days supply | Qty: 30 | Fill #0

## 2016-06-08 DIAGNOSIS — E669 Obesity, unspecified: Secondary | ICD-10-CM | POA: Diagnosis not present

## 2016-06-08 DIAGNOSIS — G2401 Drug induced subacute dyskinesia: Secondary | ICD-10-CM | POA: Diagnosis not present

## 2016-06-08 DIAGNOSIS — F250 Schizoaffective disorder, bipolar type: Secondary | ICD-10-CM | POA: Diagnosis not present

## 2016-06-08 DIAGNOSIS — F429 Obsessive-compulsive disorder, unspecified: Secondary | ICD-10-CM | POA: Diagnosis not present

## 2016-06-08 DIAGNOSIS — F410 Panic disorder [episodic paroxysmal anxiety] without agoraphobia: Secondary | ICD-10-CM | POA: Diagnosis not present

## 2016-06-11 ENCOUNTER — Telehealth: Payer: 59 | Admitting: Physician Assistant

## 2016-06-11 DIAGNOSIS — M549 Dorsalgia, unspecified: Secondary | ICD-10-CM | POA: Diagnosis not present

## 2016-06-11 MED ORDER — BACLOFEN 10 MG PO TABS: 10.0000 mg | ORAL_TABLET | Freq: Three times a day (TID) | ORAL | 0 refills | Status: DC

## 2016-06-11 NOTE — Progress Notes (Signed)
We are sorry that you are not feeling well.  Here is how we plan to help!  Based on what you have shared with me it looks like you mostly have acute back pain.  Acute back pain is defined as musculoskeletal pain that can resolve in 1-3 weeks with conservative treatment.  I have prescribedBaclofen 10 mg every eight hours as needed which is a muscle relaxer. Use extra-strength tylenol over-the-counter (no more than as directed on the bottle) Some patients experience stomach irritation or in increased heartburn with anti-inflammatory drugs.  Please keep in mind that muscle relaxer's can cause fatigue and should not be taken while at work or driving.  Back pain is very common.  The pain often gets better over time.  The cause of back pain is usually not dangerous.  Most people can learn to manage their back pain on their own.  Home Care  Stay active.  Start with short walks on flat ground if you can.  Try to walk farther each day.  Do not sit, drive or stand in one place for more than 30 minutes.  Do not stay in bed.  Do not avoid exercise or work.  Activity can help your back heal faster.  Be careful when you bend or lift an object.  Bend at your knees, keep the object close to you, and do not twist.  Sleep on a firm mattress.  Lie on your side, and bend your knees.  If you lie on your back, put a pillow under your knees.  Only take medicines as told by your doctor.  Put ice on the injured area.  Put ice in a plastic bag  Place a towel between your skin and the bag  Leave the ice on for 15-20 minutes, 3-4 times a day for the first 2-3 days. 210 After that, you can switch between ice and heat packs.  Ask your doctor about back exercises or massage.  Avoid feeling anxious or stressed.  Find good ways to deal with stress, such as exercise.  Get Help Right Way If:  Your pain does not go away with rest or medicine.  Your pain does not go away in 1 week.  You have new problems.  You  do not feel well.  The pain spreads into your legs.  You cannot control when you poop (bowel movement) or pee (urinate)  You feel sick to your stomach (nauseous) or throw up (vomit)  You have belly (abdominal) pain.  You feel like you may pass out (faint).  If you develop a fever.  Make Sure you:  Understand these instructions.  Will watch your condition  Will get help right away if you are not doing well or get worse.  Your e-visit answers were reviewed by a board certified advanced clinical practitioner to complete your personal care plan.  Depending on the condition, your plan could have included both over the counter or prescription medications.  If there is a problem please reply  once you have received a response from your provider.  Your safety is important to [place].  If you have drug allergies check your prescription carefully.    You can use MyChart to ask questions about today's visit, request a non-urgent call back, or ask for a work or school excuse for 24 hours related to this e-Visit. If it has been greater than 24 hours you will need to follow up with your provider, or enter a new e-Visit to address those concerns.  You will get an e-mail in the next two days asking about your experience.  I hope that your e-visit has been valuable and will speed your recovery. Thank you for using e-visits.

## 2016-06-15 MED FILL — BACLOFEN 10 MG TABLET: 10 | 10 days supply | Qty: 30 | Fill #0

## 2016-06-15 MED FILL — lamoTRIgine 25 MG TABS: 25 | 30 days supply | Qty: 60 | Fill #1

## 2016-06-24 MED FILL — PALIPERIDONE ER 3 MG TABLET: 3 | 30 days supply | Qty: 30 | Fill #1

## 2016-06-26 DIAGNOSIS — E669 Obesity, unspecified: Secondary | ICD-10-CM | POA: Diagnosis not present

## 2016-06-26 DIAGNOSIS — F250 Schizoaffective disorder, bipolar type: Secondary | ICD-10-CM | POA: Diagnosis not present

## 2016-06-26 DIAGNOSIS — F410 Panic disorder [episodic paroxysmal anxiety] without agoraphobia: Secondary | ICD-10-CM | POA: Diagnosis not present

## 2016-06-26 DIAGNOSIS — F429 Obsessive-compulsive disorder, unspecified: Secondary | ICD-10-CM | POA: Diagnosis not present

## 2016-07-08 DIAGNOSIS — Z79899 Other long term (current) drug therapy: Secondary | ICD-10-CM | POA: Diagnosis not present

## 2016-07-17 MED FILL — clonazePAM 0.5 MG TABS: 0.5 | 30 days supply | Qty: 75 | Fill #0

## 2016-07-17 MED FILL — lamoTRIgine 25 MG TABS: 25 | 30 days supply | Qty: 60 | Fill #2

## 2016-07-28 DIAGNOSIS — G4700 Insomnia, unspecified: Secondary | ICD-10-CM | POA: Diagnosis not present

## 2016-07-28 DIAGNOSIS — G2401 Drug induced subacute dyskinesia: Secondary | ICD-10-CM | POA: Diagnosis not present

## 2016-07-28 DIAGNOSIS — F250 Schizoaffective disorder, bipolar type: Secondary | ICD-10-CM | POA: Diagnosis not present

## 2016-07-28 DIAGNOSIS — Z79899 Other long term (current) drug therapy: Secondary | ICD-10-CM | POA: Diagnosis not present

## 2016-07-28 DIAGNOSIS — F410 Panic disorder [episodic paroxysmal anxiety] without agoraphobia: Secondary | ICD-10-CM | POA: Diagnosis not present

## 2016-07-28 MED FILL — hydrOXYzine HCL 50 MG TABS: 50 | 30 days supply | Qty: 60 | Fill #0

## 2016-07-28 MED FILL — ARIPiprazole 2 MG TABS: 2 | 30 days supply | Qty: 30 | Fill #0

## 2016-07-28 MED FILL — PALIPERIDONE ER 3 MG TABLET: 3 | 30 days supply | Qty: 30 | Fill #0

## 2016-08-07 MED FILL — REXULTI 0.25 MG TAB: 0.25 | 30 days supply | Qty: 30 | Fill #0

## 2016-08-28 DIAGNOSIS — F429 Obsessive-compulsive disorder, unspecified: Secondary | ICD-10-CM | POA: Diagnosis not present

## 2016-08-28 DIAGNOSIS — F410 Panic disorder [episodic paroxysmal anxiety] without agoraphobia: Secondary | ICD-10-CM | POA: Diagnosis not present

## 2016-08-28 DIAGNOSIS — E669 Obesity, unspecified: Secondary | ICD-10-CM | POA: Diagnosis not present

## 2016-08-28 DIAGNOSIS — F250 Schizoaffective disorder, bipolar type: Secondary | ICD-10-CM | POA: Diagnosis not present

## 2016-08-31 MED FILL — PALIPERIDONE ER 3 MG TABLET: 3 | 30 days supply | Qty: 30 | Fill #1

## 2016-09-01 DIAGNOSIS — F250 Schizoaffective disorder, bipolar type: Secondary | ICD-10-CM | POA: Diagnosis not present

## 2016-09-01 DIAGNOSIS — Z79899 Other long term (current) drug therapy: Secondary | ICD-10-CM | POA: Diagnosis not present

## 2016-09-01 DIAGNOSIS — F410 Panic disorder [episodic paroxysmal anxiety] without agoraphobia: Secondary | ICD-10-CM | POA: Diagnosis not present

## 2016-09-01 DIAGNOSIS — G2401 Drug induced subacute dyskinesia: Secondary | ICD-10-CM | POA: Diagnosis not present

## 2016-09-01 MED FILL — lamoTRIgine 25 MG TABS: 25 | 30 days supply | Qty: 60 | Fill #0

## 2016-09-01 MED FILL — clonazePAM 0.5 MG TABS: 0.5 | 30 days supply | Qty: 90 | Fill #0

## 2016-09-01 MED FILL — REXULTI 0.25 MG TAB: 0.25 | 30 days supply | Qty: 30 | Fill #0

## 2016-09-23 MED FILL — FENOFIBRATE 160 MG TABLET: 160 | 30 days supply | Qty: 30 | Fill #0

## 2016-09-28 MED FILL — lamoTRIgine 25 MG TABS: 25 | 30 days supply | Qty: 60 | Fill #1

## 2016-09-28 MED FILL — clonazePAM 0.5 MG TABS: 0.5 | 30 days supply | Qty: 90 | Fill #0

## 2016-09-28 MED FILL — PALIPERIDONE ER 3 MG TABLET: 3 | 30 days supply | Qty: 30 | Fill #0

## 2016-10-09 DIAGNOSIS — J069 Acute upper respiratory infection, unspecified: Secondary | ICD-10-CM | POA: Diagnosis not present

## 2016-10-09 MED FILL — CEFDINIR 300 MG CAPSULE: 300 | 10 days supply | Qty: 20 | Fill #0

## 2016-10-09 MED FILL — BENZONATATE 100 MG CAP: 100 | 5 days supply | Qty: 20 | Fill #0

## 2016-10-23 DIAGNOSIS — F250 Schizoaffective disorder, bipolar type: Secondary | ICD-10-CM | POA: Diagnosis not present

## 2016-10-23 DIAGNOSIS — F429 Obsessive-compulsive disorder, unspecified: Secondary | ICD-10-CM | POA: Diagnosis not present

## 2016-10-23 DIAGNOSIS — E669 Obesity, unspecified: Secondary | ICD-10-CM | POA: Diagnosis not present

## 2016-10-23 DIAGNOSIS — F410 Panic disorder [episodic paroxysmal anxiety] without agoraphobia: Secondary | ICD-10-CM | POA: Diagnosis not present

## 2016-10-26 MED FILL — PALIPERIDONE ER 3 MG TABLET: 3 | 30 days supply | Qty: 30 | Fill #1

## 2016-10-26 MED FILL — FENOFIBRATE 160 MG TABLET: 160 | 30 days supply | Qty: 30 | Fill #1

## 2016-10-26 MED FILL — lamoTRIgine 25 MG TABS: 25 | 30 days supply | Qty: 60 | Fill #2

## 2016-10-26 MED FILL — REXULTI 0.25 MG TAB: 0.25 | 30 days supply | Qty: 30 | Fill #1

## 2016-11-14 DIAGNOSIS — E785 Hyperlipidemia, unspecified: Secondary | ICD-10-CM | POA: Diagnosis not present

## 2016-11-14 DIAGNOSIS — F319 Bipolar disorder, unspecified: Secondary | ICD-10-CM | POA: Diagnosis not present

## 2016-11-14 DIAGNOSIS — Z880 Allergy status to penicillin: Secondary | ICD-10-CM | POA: Diagnosis not present

## 2016-11-14 DIAGNOSIS — W268XXA Contact with other sharp object(s), not elsewhere classified, initial encounter: Secondary | ICD-10-CM | POA: Diagnosis not present

## 2016-11-14 DIAGNOSIS — F17210 Nicotine dependence, cigarettes, uncomplicated: Secondary | ICD-10-CM | POA: Diagnosis not present

## 2016-11-14 DIAGNOSIS — S61211A Laceration without foreign body of left index finger without damage to nail, initial encounter: Secondary | ICD-10-CM | POA: Diagnosis not present

## 2016-11-14 DIAGNOSIS — Z886 Allergy status to analgesic agent status: Secondary | ICD-10-CM | POA: Diagnosis not present

## 2016-11-14 DIAGNOSIS — Z23 Encounter for immunization: Secondary | ICD-10-CM | POA: Diagnosis not present

## 2016-11-14 DIAGNOSIS — Z883 Allergy status to other anti-infective agents status: Secondary | ICD-10-CM | POA: Diagnosis not present

## 2016-11-14 DIAGNOSIS — Z881 Allergy status to other antibiotic agents status: Secondary | ICD-10-CM | POA: Diagnosis not present

## 2016-11-23 MED FILL — FENOFIBRATE 160 MG TABLET: 160 | 30 days supply | Qty: 30 | Fill #2

## 2016-11-25 DIAGNOSIS — Z79899 Other long term (current) drug therapy: Secondary | ICD-10-CM | POA: Diagnosis not present

## 2016-11-26 MED FILL — PALIPERIDONE ER 3 MG TABLET: 3 | 30 days supply | Qty: 30 | Fill #0

## 2016-11-26 MED FILL — REXULTI 0.25 MG TAB: 0.25 | 30 days supply | Qty: 30 | Fill #0

## 2016-11-26 MED FILL — lamoTRIgine 25 MG TABS: 25 | 30 days supply | Qty: 60 | Fill #0

## 2016-11-26 MED FILL — clonazePAM 0.5 MG TABS: 0.5 | 30 days supply | Qty: 90 | Fill #0

## 2016-12-01 DIAGNOSIS — F410 Panic disorder [episodic paroxysmal anxiety] without agoraphobia: Secondary | ICD-10-CM | POA: Diagnosis not present

## 2016-12-01 DIAGNOSIS — F250 Schizoaffective disorder, bipolar type: Secondary | ICD-10-CM | POA: Diagnosis not present

## 2016-12-01 DIAGNOSIS — E229 Hyperfunction of pituitary gland, unspecified: Secondary | ICD-10-CM | POA: Diagnosis not present

## 2016-12-01 DIAGNOSIS — F429 Obsessive-compulsive disorder, unspecified: Secondary | ICD-10-CM | POA: Diagnosis not present

## 2016-12-03 ENCOUNTER — Telehealth: Payer: 59 | Admitting: Physician Assistant

## 2016-12-03 DIAGNOSIS — J329 Chronic sinusitis, unspecified: Secondary | ICD-10-CM

## 2016-12-03 DIAGNOSIS — B9789 Other viral agents as the cause of diseases classified elsewhere: Secondary | ICD-10-CM | POA: Diagnosis not present

## 2016-12-03 MED ORDER — FLUTICASONE PROPIONATE 50 MCG/ACT NA SUSP: 2.0000 | Freq: Every day | NASAL | 6 refills | Status: DC

## 2016-12-03 MED FILL — FLUTICASONE PROP 50 MCG SPR: 50 | 30 days supply | Qty: 16 | Fill #0

## 2016-12-03 NOTE — Progress Notes (Signed)

## 2016-12-06 ENCOUNTER — Telehealth: Payer: 59 | Admitting: Physician Assistant

## 2016-12-06 DIAGNOSIS — B9689 Other specified bacterial agents as the cause of diseases classified elsewhere: Secondary | ICD-10-CM

## 2016-12-06 DIAGNOSIS — J019 Acute sinusitis, unspecified: Secondary | ICD-10-CM | POA: Diagnosis not present

## 2016-12-06 MED ORDER — LEVOFLOXACIN 500 MG PO TABS: 500.0000 mg | ORAL_TABLET | Freq: Every day | ORAL | 0 refills | Status: DC

## 2016-12-06 NOTE — Progress Notes (Signed)

## 2016-12-17 DIAGNOSIS — F429 Obsessive-compulsive disorder, unspecified: Secondary | ICD-10-CM | POA: Diagnosis not present

## 2016-12-17 DIAGNOSIS — F410 Panic disorder [episodic paroxysmal anxiety] without agoraphobia: Secondary | ICD-10-CM | POA: Diagnosis not present

## 2016-12-17 DIAGNOSIS — E669 Obesity, unspecified: Secondary | ICD-10-CM | POA: Diagnosis not present

## 2016-12-17 DIAGNOSIS — F250 Schizoaffective disorder, bipolar type: Secondary | ICD-10-CM | POA: Diagnosis not present

## 2016-12-22 MED FILL — REXULTI 0.25 MG TAB: 0.25 | 30 days supply | Qty: 30 | Fill #1

## 2016-12-30 MED FILL — lamoTRIgine 25 MG TABS: 25 | 30 days supply | Qty: 60 | Fill #1

## 2016-12-30 MED FILL — FENOFIBRATE 160 MG TABLET: 160 | 30 days supply | Qty: 30 | Fill #3

## 2016-12-30 MED FILL — PALIPERIDONE ER 3 MG TABLET: 3 | 30 days supply | Qty: 30 | Fill #1

## 2017-01-15 DIAGNOSIS — F410 Panic disorder [episodic paroxysmal anxiety] without agoraphobia: Secondary | ICD-10-CM | POA: Diagnosis not present

## 2017-01-15 DIAGNOSIS — F429 Obsessive-compulsive disorder, unspecified: Secondary | ICD-10-CM | POA: Diagnosis not present

## 2017-01-15 DIAGNOSIS — F250 Schizoaffective disorder, bipolar type: Secondary | ICD-10-CM | POA: Diagnosis not present

## 2017-01-15 MED FILL — clonazePAM 0.5 MG TABS: 0.5 | 30 days supply | Qty: 45 | Fill #0

## 2017-01-15 MED FILL — REXULTI 0.25 MG TAB: 0.25 | 30 days supply | Qty: 30 | Fill #0

## 2017-01-21 ENCOUNTER — Telehealth: Payer: 59 | Admitting: Family

## 2017-01-21 DIAGNOSIS — R05 Cough: Secondary | ICD-10-CM

## 2017-01-21 DIAGNOSIS — R059 Cough, unspecified: Secondary | ICD-10-CM

## 2017-01-21 MED ORDER — BENZONATATE 100 MG PO CAPS: 100.0000 mg | ORAL_CAPSULE | Freq: Three times a day (TID) | ORAL | 0 refills | Status: DC | PRN

## 2017-01-21 NOTE — Progress Notes (Signed)
Thank you for the details you included in the comment boxes. Those details are very helpful in determining the best course of treatment for you and help [place] to provide the best care.  E visit for Flu like symptoms   We are sorry that you are not feeling well.  Here is how we plan to help! Based on what you have shared with me it looks like you may have flu-like symptoms that should be watched but do not seem to indicate anti-viral treatment.  Influenza or "the flu" is   an infection caused by a respiratory virus. The flu virus is highly contagious and persons who did not receive their yearly flu vaccination may "catch" the flu from close contact.  We have anti-viral medications to treat the viruses that cause this infection. They are not a "cure" and only shorten the course of the infection. These prescriptions are most effective when they are given within the first 2 days of "flu" symptoms. Antiviral medication are indicated if you have a high risk of complications from the flu. You should  also consider an antiviral medication if you are in close contact with someone who is at risk. These medications can help patients avoid complications from the flu  but have side effects that you should know. Possible side effects from Tamiflu or oseltamivir include nausea, vomiting, diarrhea, dizziness, headaches, eye redness, sleep problems or other respiratory symptoms. You should not take Tamiflu if you have an allergy to oseltamivir or any to the ingredients in Tamiflu.  Based upon your symptoms and potential risk factors I recommend that you follow the flu symptoms recommendation that I have listed below.   Also, I am sending Tessalon Perles for cough, 100mg , take 1-2 capsules every 8 hours as needed for cough.   ANYONE WHO HAS FLU SYMPTOMS SHOULD: . Stay home. The flu is highly contagious and going out or to work exposes others! . Be sure to drink plenty of fluids. Water is fine as well as fruit juices,  sodas and electrolyte beverages. You may want to stay away from caffeine or alcohol. If you are nauseated, try taking small sips of liquids. How do you know if you are getting enough fluid? Your urine should be a pale yellow or almost colorless. . Get rest. . Taking a steamy shower or using a humidifier may help nasal congestion and ease sore throat pain. Using a saline nasal spray works much the same way. . Cough drops, hard candies and sore throat lozenges may ease your cough. . Line up a caregiver. Have someone check on you regularly.   GET HELP RIGHT AWAY IF: . You cannot keep down liquids or your medications. . You become short of breath . Your fell like you are going to pass out or loose consciousness. . Your symptoms persist after you have completed your treatment plan MAKE SURE YOU   Understand these instructions.  Will watch your condition.  Will get help right away if you are not doing well or get worse.  Your e-visit answers were reviewed by a board certified advanced clinical practitioner to complete your personal care plan.  Depending on the condition, your plan could have included both over the counter or prescription medications.  If there is a problem please reply  once you have received a response from your provider.  Your safety is important to [place].  If you have drug allergies check your prescription carefully.    You can use MyChart to ask  questions about today's visit, request a non-urgent call back, or ask for a work or school excuse for 24 hours related to this e-Visit. If it has been greater than 24 hours you will need to follow up with your provider, or enter a new e-Visit to address those concerns.  You will get an e-mail in the next two days asking about your experience.  I hope that your e-visit has been valuable and will speed your recovery. Thank you for using e-visits.

## 2017-02-01 MED FILL — BENZONATATE 100 MG CAPSULE: 100 | 5 days supply | Qty: 30 | Fill #0

## 2017-02-01 MED FILL — PALIPERIDONE ER 3 MG TABLET: 3 | 30 days supply | Qty: 30 | Fill #0

## 2017-02-01 MED FILL — lamoTRIgine 25 MG TABS: 25 | 30 days supply | Qty: 60 | Fill #0

## 2017-02-12 DIAGNOSIS — T50905A Adverse effect of unspecified drugs, medicaments and biological substances, initial encounter: Secondary | ICD-10-CM | POA: Diagnosis not present

## 2017-02-12 DIAGNOSIS — N914 Secondary oligomenorrhea: Secondary | ICD-10-CM | POA: Diagnosis not present

## 2017-02-12 DIAGNOSIS — N643 Galactorrhea not associated with childbirth: Secondary | ICD-10-CM | POA: Diagnosis not present

## 2017-02-12 DIAGNOSIS — E221 Hyperprolactinemia: Secondary | ICD-10-CM | POA: Diagnosis not present

## 2017-02-16 MED FILL — FENOFIBRATE 160 MG TABLET: 160 | 30 days supply | Qty: 30 | Fill #4

## 2017-02-17 MED FILL — REXULTI 0.25 MG TAB: 0.25 | 30 days supply | Qty: 30 | Fill #1

## 2017-02-19 MED FILL — ERYTHROMYCIN EYE OINTMENT: 5 | 10 days supply | Qty: 4 | Fill #0

## 2017-03-01 MED FILL — lamoTRIgine 25 MG TABS: 25 | 30 days supply | Qty: 60 | Fill #1

## 2017-03-01 MED FILL — PALIPERIDONE ER 3 MG TABLET: 3 | 30 days supply | Qty: 30 | Fill #1

## 2017-03-15 MED FILL — clonazePAM 0.5 MG TABS: 0.5 | 30 days supply | Qty: 45 | Fill #0

## 2017-03-15 MED FILL — REXULTI 0.25 MG TAB: 0.25 | 30 days supply | Qty: 30 | Fill #0

## 2017-03-29 MED FILL — lamoTRIgine 25 MG TABS: 25 | 30 days supply | Qty: 60 | Fill #0

## 2017-03-29 MED FILL — FENOFIBRATE 160 MG TABLET: 160 | 30 days supply | Qty: 30 | Fill #5

## 2017-03-29 MED FILL — PALIPERIDONE ER 3 MG TABLET: 3 | 30 days supply | Qty: 30 | Fill #0

## 2017-04-08 ENCOUNTER — Telehealth: Payer: 59 | Admitting: Family

## 2017-04-08 DIAGNOSIS — B9689 Other specified bacterial agents as the cause of diseases classified elsewhere: Secondary | ICD-10-CM | POA: Diagnosis not present

## 2017-04-08 DIAGNOSIS — J329 Chronic sinusitis, unspecified: Secondary | ICD-10-CM

## 2017-04-08 MED ORDER — LEVOFLOXACIN 500 MG PO TABS: 500.0000 mg | ORAL_TABLET | Freq: Every day | ORAL | 0 refills | Status: DC

## 2017-04-08 MED FILL — levoFLOXacin 500 MG TABS: 500 | 7 days supply | Qty: 7 | Fill #0

## 2017-04-08 NOTE — Progress Notes (Signed)
Thank you for the details you included in the comment boxes. Those details are very helpful in determining the best course of treatment for you and help us to provide the best care.  We are sorry that you are not feeling well.  Here is how we plan to help!  Based on what you have shared with me it looks like you have sinusitis.  Sinusitis is inflammation and infection in the sinus cavities of the head.  Based on your presentation I believe you most likely have Acute Bacterial Sinusitis.  This is an infection caused by bacteria and is treated with antibiotics. I have prescribed Levofloxicin 500mg by mouth once daily for 7 days. You may use an oral decongestant such as Mucinex D or if you have glaucoma or high blood pressure use plain Mucinex. Saline nasal spray help and can safely be used as often as needed for congestion.  If you develop worsening sinus pain, fever or notice severe headache and vision changes, or if symptoms are not better after completion of antibiotic, please schedule an appointment with a health care provider.    Sinus infections are not as easily transmitted as other respiratory infection, however we still recommend that you avoid close contact with loved ones, especially the very young and elderly.  Remember to wash your hands thoroughly throughout the day as this is the number one way to prevent the spread of infection!  Home Care:  Only take medications as instructed by your medical team.  Complete the entire course of an antibiotic.  Do not take these medications with alcohol.  A steam or ultrasonic humidifier can help congestion.  You can place a towel over your head and breathe in the steam from hot water coming from a faucet.  Avoid close contacts especially the very young and the elderly.  Cover your mouth when you cough or sneeze.  Always remember to wash your hands.  Get Help Right Away If:  You develop worsening fever or sinus pain.  You develop a severe  head ache or visual changes.  Your symptoms persist after you have completed your treatment plan.  Make sure you  Understand these instructions.  Will watch your condition.  Will get help right away if you are not doing well or get worse.  Your e-visit answers were reviewed by a board certified advanced clinical practitioner to complete your personal care plan.  Depending on the condition, your plan could have included both over the counter or prescription medications.  If there is a problem please reply  once you have received a response from your provider.  Your safety is important to us.  If you have drug allergies check your prescription carefully.    You can use MyChart to ask questions about today's visit, request a non-urgent call back, or ask for a work or school excuse for 24 hours related to this e-Visit. If it has been greater than 24 hours you will need to follow up with your provider, or enter a new e-Visit to address those concerns.  You will get an e-mail in the next two days asking about your experience.  I hope that your e-visit has been valuable and will speed your recovery. Thank you for using e-visits.    

## 2017-04-09 DIAGNOSIS — F410 Panic disorder [episodic paroxysmal anxiety] without agoraphobia: Secondary | ICD-10-CM | POA: Diagnosis not present

## 2017-04-09 DIAGNOSIS — F250 Schizoaffective disorder, bipolar type: Secondary | ICD-10-CM | POA: Diagnosis not present

## 2017-04-09 DIAGNOSIS — F429 Obsessive-compulsive disorder, unspecified: Secondary | ICD-10-CM | POA: Diagnosis not present

## 2017-04-09 MED FILL — REXULTI 0.25 MG TAB: 0.25 | 30 days supply | Qty: 30 | Fill #0

## 2017-04-19 MED FILL — clonazePAM 0.5 MG TABS: 0.5 | 30 days supply | Qty: 45 | Fill #0

## 2017-05-03 MED FILL — PALIPERIDONE ER 3 MG TABLET: 3 | 30 days supply | Qty: 30 | Fill #1

## 2017-05-03 MED FILL — lamoTRIgine 25 MG TABS: 25 | 30 days supply | Qty: 60 | Fill #1

## 2017-05-04 ENCOUNTER — Ambulatory Visit (INDEPENDENT_AMBULATORY_CARE_PROVIDER_SITE_OTHER): Payer: 59

## 2017-05-04 ENCOUNTER — Other Ambulatory Visit: Payer: Self-pay | Admitting: Podiatry

## 2017-05-04 ENCOUNTER — Ambulatory Visit: Payer: 59 | Admitting: Podiatry

## 2017-05-04 ENCOUNTER — Encounter: Payer: Self-pay | Admitting: Podiatry

## 2017-05-04 VITALS — BP 106/73 | HR 94 | Resp 16

## 2017-05-04 DIAGNOSIS — M722 Plantar fascial fibromatosis: Secondary | ICD-10-CM

## 2017-05-04 DIAGNOSIS — M779 Enthesopathy, unspecified: Principal | ICD-10-CM

## 2017-05-04 DIAGNOSIS — M778 Other enthesopathies, not elsewhere classified: Secondary | ICD-10-CM

## 2017-05-04 MED ORDER — METHYLPREDNISOLONE 4 MG PO TBPK: ORAL_TABLET | ORAL | 0 refills | Status: DC

## 2017-05-04 MED ORDER — MELOXICAM 15 MG PO TABS: 15.0000 mg | ORAL_TABLET | Freq: Every day | ORAL | 3 refills | Status: DC

## 2017-05-04 MED FILL — MELOXICAM 15 MG TABLET: 15 | 30 days supply | Qty: 30 | Fill #0

## 2017-05-04 MED FILL — METHYLPREDNISOLONE 4 MG TAB: 4 | 6 days supply | Qty: 21 | Fill #0

## 2017-05-04 NOTE — Patient Instructions (Signed)

## 2017-05-04 NOTE — Progress Notes (Signed)
Subjective:  Patient ID: Allison Cunningham, female    DOB: 1980-12-13,  MRN: [id number] HPI Chief Complaint  Patient presents with  . Foot Pain    Plantar "whole bottom" bilateral - stabbing, pulling sensations x several months, AM pain worse at end of day, tried Ibuprofen, Tylenol, foot rub, bought bands with arch pads-no help    37 y.o. female presents with the above complaint.     Past Medical History:  Diagnosis Date  . Anxiety   . Dizziness   . Palpitations   . Pre-syncope   . Syncope and collapse    has no happened in last year--last 2013   Past Surgical History:  Procedure Laterality Date  . CESAREAN SECTION     x2    Current Outpatient Medications:  .  Brexpiprazole 0.25 MG TABS, Take by mouth., Disp: , Rfl:  .  erythromycin ophthalmic ointment, Place one application into both eyes at bedtime., Disp: , Rfl:  .  fenofibrate 160 MG tablet, Take by mouth., Disp: , Rfl:  .  lamoTRIgine (LAMICTAL) 25 MG tablet, Take by mouth., Disp: , Rfl:  .  paliperidone (INVEGA) 3 MG 24 hr tablet, Take by mouth., Disp: , Rfl:  .  Valbenazine Tosylate 80 MG CAPS, Take by mouth., Disp: , Rfl:  .  acetaminophen (TYLENOL) 325 MG tablet, Take 650 mg by mouth every 6 (six) hours as needed. Patient used this medication for pain., Disp: , Rfl:  .  albuterol (PROVENTIL HFA;VENTOLIN HFA) 108 (90 BASE) MCG/ACT inhaler, Inhale 2 puffs into the lungs every 6 (six) hours as needed for wheezing or shortness of breath., Disp: 1 Inhaler, Rfl: 2 .  baclofen (LIORESAL) 10 MG tablet, Take 1 tablet (10 mg total) by mouth 3 (three) times daily., Disp: 30 each, Rfl: 0 .  Biotin 5000 MCG TABS, Take 1 tablet by mouth daily., Disp: , Rfl:  .  carvedilol (COREG) 3.125 MG tablet, Take 1 tablet (3.125 mg total) by mouth 2 (two) times daily with a meal., Disp: 180 tablet, Rfl: 3 .  cetirizine (ZYRTEC) 10 MG tablet, Take 10 mg by mouth daily., Disp: , Rfl:  .  clonazePAM (KLONOPIN) 0.5 MG tablet, Take 0.5 mg by mouth  2 (two) times daily., Disp: , Rfl:  .  fluticasone (FLONASE) 50 MCG/ACT nasal spray, Place 2 sprays into both nostrils daily., Disp: 16 g, Rfl: 6 .  omega-3 acid ethyl esters (LOVAZA) 1 g capsule, Take 2 capsules (2 g total) by mouth 2 (two) times daily., Disp: 120 capsule, Rfl: 6 .  risperiDONE (RISPERDAL) 0.5 MG tablet, Take 0.5 mg by mouth 2 (two) times daily., Disp: , Rfl:  .  rosuvastatin (CRESTOR) 5 MG tablet, Take 1 tablet (5 mg total) by mouth daily., Disp: 30 tablet, Rfl: 3  Allergies  Allergen Reactions  . Bactrim [Sulfamethoxazole-Trimethoprim] Anaphylaxis  . Doxycycline     "it can kill me"  . Ibuprofen Other (See Comments)    GI upset in high dosage  . Cephalexin Nausea And Vomiting  . Clarithromycin Swelling  . Aspirin   . Clindamycin/Lincomycin   . Amoxicillin-Pot Clavulanate Rash  . Penicillins Rash   Review of Systems  All other systems reviewed and are negative.  Objective:   Vitals:   05/04/17 1555  BP: 106/73  Pulse: 94  Resp: 16    General: Well developed, nourished, in no acute distress, alert and oriented x3   Dermatological: Skin is warm, dry and supple bilateral. Nails x 10 are  well maintained; remaining integument appears unremarkable at this time. There are no open sores, no preulcerative lesions, no rash or signs of infection present.  Vascular: Dorsalis Pedis artery and Posterior Tibial artery pedal pulses are 2/4 bilateral with immedate capillary fill time. Pedal hair growth present. No varicosities and no lower extremity edema present bilateral.   Neruologic: Grossly intact via light touch bilateral. Vibratory intact via tuning fork bilateral. Protective threshold with Semmes Wienstein monofilament intact to all pedal sites bilateral. Patellar and Achilles deep tendon reflexes 2+ bilateral. No Babinski or clonus noted bilateral.   Musculoskeletal: No gross boney pedal deformities bilateral. No pain, crepitus, or limitation noted with foot and  ankle range of motion bilateral. Muscular strength 5/5 in all groups tested bilateral.  Mild cavus foot type.  Gastroc equinus can barely reach 90 degrees to the leg.  Or 0 dorsiflexion.  She has severe pain on palpation medial calcaneal tubercle left greater than right  Gait: Unassisted, Nonantalgic.    Radiographs:  Taken today demonstrate an osseously mature individual no acute trauma slight increase in the plantar fascial calcaneal insertion site soft tissue indicative of plantar fasciitis.  Mild cavus foot type.  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    Plan: We discussed the etiology pathology conservative versus surgical therapies.  And after verbal consent decided to inject the bilateral heels.  We injected them with 20 mg of Kenalog 5 mg of Marcaine after sterile Betadine skin prep bilateral foot medial aspect.  She tolerated procedures well without complications.  Dispensed plantar fascial braces and night splint.  Start her on a Medrol Dosepak to be followed by meloxicam.  We will follow-up with her in 1 month.  Discussed appropriate shoe gear stretching exercises ice therapy shoe gear modifications and stretching exercises were provided.     Max T. [place], [place]

## 2017-05-14 ENCOUNTER — Telehealth: Payer: Self-pay | Admitting: *Deleted

## 2017-05-14 ENCOUNTER — Ambulatory Visit (INDEPENDENT_AMBULATORY_CARE_PROVIDER_SITE_OTHER): Payer: 59 | Admitting: Podiatry

## 2017-05-14 ENCOUNTER — Encounter: Payer: Self-pay | Admitting: Podiatry

## 2017-05-14 DIAGNOSIS — M722 Plantar fascial fibromatosis: Secondary | ICD-10-CM

## 2017-05-14 NOTE — Telephone Encounter (Signed)
I spoke with pt and she states there is not redness, or swelling but the left calf is firmer than the right. I offered pt a 2:45pm appt today and she states she will be here.

## 2017-05-14 NOTE — Telephone Encounter (Signed)
Left message informing pt that we would like her to call concerning her MyChart message and the pain in her left calf and that I would send a message for her to contact us.

## 2017-05-18 MED FILL — REXULTI 0.25 MG TAB: 0.25 | 30 days supply | Qty: 30 | Fill #1

## 2017-05-21 ENCOUNTER — Telehealth: Payer: 59 | Admitting: Family

## 2017-05-21 DIAGNOSIS — H5789 Other specified disorders of eye and adnexa: Secondary | ICD-10-CM | POA: Diagnosis not present

## 2017-05-21 NOTE — Progress Notes (Signed)
Thank you for the details you included in the comment boxes. Those details are very helpful in determining the best course of treatment for you and help [place] to provide the best care. Typically, these situations arise from a small particle or particles in the eye. Occasionally, this can occur during sleep and is discovered upon waking with eye pain in one or both eyes. If the discharge is clear, we maintain supportive treatment with Visine or similar saline eye drops (guidelines below). If the drainage/discharge from your eye becomes colored or "crusty", we would look more toward bacterial conjunctivitis and provide an antibiotic. Please follow the guidelines below. If the drainage changes to green, yellow, white, "crusty," please contact us and we will prescribe an antibiotic. More than likely, it will improve with the supportive care below.  We are sorry that you are not feeling well.  Here is how we plan to help!  Based on what you have shared with me it looks like you have conjunctivitis.  Conjunctivitis is a common inflammatory or infectious condition of the eye ; when viral/bacterial, it often referred to as "pink eye".  In most cases it is contagious (viral or bacterial). However, not all conjunctivitis requires antibiotics (ex. Allergic).  We have made appropriate suggestions for you based upon your presentation.  Pink eye can be highly contagious.  It is typically spread through direct contact with secretions, or contaminated objects or surfaces that one may have touched.  Strict handwashing is suggested with soap and water is urged.  If not available, use alcohol based had sanitizer.  Avoid unnecessary touching of the eye.  If you wear contact lenses, you will need to refrain from wearing them until you see no white discharge from the eye for at least 24 hours after being on medication.  You should see symptom improvement in 1-2 days after starting the medication regimen.  Call us if symptoms are not  improved in 1-2 days.  Home Care:  Wash your hands often!  Do not wear your contacts until you complete your treatment plan.  Avoid sharing towels, bed linen, personal items with a person who has pink eye.  See attention for anyone in your home with similar symptoms.  Get Help Right Away If:  Your symptoms do not improve.  You develop blurred or loss of vision.  Your symptoms worsen (increased discharge, pain or redness)  Your e-visit answers were reviewed by a board certified advanced clinical practitioner to complete your personal care plan.  Depending on the condition, your plan could have included both over the counter or prescription medications.  If there is a problem please reply  once you have received a response from your provider.  Your safety is important to [place].  If you have drug allergies check your prescription carefully.    You can use MyChart to ask questions about today's visit, request a non-urgent call back, or ask for a work or school excuse for 24 hours related to this e-Visit. If it has been greater than 24 hours you will need to follow up with your provider, or enter a new e-Visit to address those concerns.   You will get an e-mail in the next two days asking about your experience.  I hope that your e-visit has been valuable and will speed your recovery. Thank you for using e-visits.

## 2017-05-29 ENCOUNTER — Telehealth: Payer: 59 | Admitting: Nurse Practitioner

## 2017-05-29 DIAGNOSIS — R197 Diarrhea, unspecified: Secondary | ICD-10-CM

## 2017-05-29 NOTE — Progress Notes (Signed)
We are sorry that you are not feeling well.  Here is how we plan to help!  Based on what you have shared with me it looks like you have Acute Infectious Diarrhea.  Most cases of acute diarrhea are due to infections with virus and bacteria and are self-limited conditions lasting less than 14 days.  For your symptoms you may take Imodium 2 mg tablets that are over the counter at your local pharmacy. Take two tablet now and then one after each loose stool up to 6 a day.  Antibiotics are not needed for most people with diarrhea.   HOME CARE  We recommend changing your diet to help with your symptoms for the next few days.  Drink plenty of fluids that contain water salt and sugar. Sports drinks such as Gatorade may help.   You may try broths, soups, bananas, applesauce, soft breads, mashed potatoes or crackers.   You are considered infectious for as long as the diarrhea continues. Hand washing or use of alcohol based hand sanitizers is recommend.  It is best to stay out of work or school until your symptoms stop.   GET HELP RIGHT AWAY  If you have dark yellow colored urine or do not pass urine frequently you should drink more fluids.    If your symptoms worsen   If you feel like you are going to pass out (faint)  You have a new problem  MAKE SURE YOU   Understand these instructions.  Will watch your condition.  Will get help right away if you are not doing well or get worse.  Your e-visit answers were reviewed by a board certified advanced clinical practitioner to complete your personal care plan.  Depending on the condition, your plan could have included both over the counter or prescription medications.  If there is a problem please reply  once you have received a response from your provider.  Your safety is important to us.  If you have drug allergies check your prescription carefully.    You can use MyChart to ask questions about today's visit, request a non-urgent call  back, or ask for a work or school excuse for 24 hours related to this e-Visit. If it has been greater than 24 hours you will need to follow up with your provider, or enter a new e-Visit to address those concerns.   You will get an e-mail in the next two days asking about your experience.  I hope that your e-visit has been valuable and will speed your recovery. Thank you for using e-visits.  

## 2017-05-31 MED FILL — lamoTRIgine 25 MG TABS: 25 | 30 days supply | Qty: 60 | Fill #2

## 2017-06-01 ENCOUNTER — Ambulatory Visit: Payer: 59 | Admitting: Podiatry

## 2017-06-01 DIAGNOSIS — M722 Plantar fascial fibromatosis: Secondary | ICD-10-CM | POA: Diagnosis not present

## 2017-06-01 MED ORDER — DICLOFENAC SODIUM 75 MG PO TBEC: 75.0000 mg | DELAYED_RELEASE_TABLET | Freq: Two times a day (BID) | ORAL | 1 refills | Status: DC

## 2017-06-01 MED FILL — clonazePAM 0.5 MG TABS: 0.5 | 30 days supply | Qty: 45 | Fill #0

## 2017-06-01 MED FILL — PALIPERIDONE ER 3 MG TABLET: 3 | 30 days supply | Qty: 30 | Fill #0

## 2017-06-01 MED FILL — DICLOFENAC SODIUM 75 MG TAB: 75 | 30 days supply | Qty: 60 | Fill #0

## 2017-06-01 NOTE — Progress Notes (Signed)
She presents today for follow-up of bilateral plantar fasciitis.  She states that it is improving but it still hurts.  She states that is still 6 out of 10 on the pain scale.  She completed the Medrol Dosepak but is currently taking the meloxicam and does not feel that it is helping very much.  She is been using the plantar fascial brace which does help but she stopped using the night splint because it was hurting her foot.  Objective: Vital signs are stable she is alert and oriented x3.  Pulses are palpable.  Neurologic sensorium is intact degenerative reflexes are intact muscle strength is normal symmetrical bilateral.  Gastroc equinus is noted bilaterally.  Left worse than the right she still has pain in the left heel greater than that of the right on palpation of the medial calcaneal tubercle.  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  Plan: Discussed etiology pathology conservative versus surgical therapies.  We discussed surgical consideration today consisting of a gastroc recession and an endoscopic fasciotomy with a Cam walker.  We discussed the possible postop complications and minutes on this expected to be out of work and I will follow-up with her in the near future for surgical intervention.  We dispensed a Cam walker in both oral and written home-going instructions for the surgical center and anesthesia group.  We will follow-up with her in the near future.

## 2017-06-01 NOTE — Patient Instructions (Signed)
Pre-Operative Instructions  Congratulations, you have decided to take an important step towards improving your quality of life.  You can be assured that the doctors and staff at [facility] will be with you every step of the way.  Here are some important things you should know:  1. Plan to be at the surgery center/hospital at least 1 (one) hour prior to your scheduled time, unless otherwise directed by the surgical center/hospital staff.  You must have a responsible adult accompany you, remain during the surgery and drive you home.  Make sure you have directions to the surgical center/hospital to ensure you arrive on time. 2. If you are having surgery at [facility] or [facility], you will need a copy of your medical history and physical form from your family physician within one month prior to the date of surgery. We will give you a form for your primary physician to complete.  3. We make every effort to accommodate the date you request for surgery.  However, there are times where surgery dates or times have to be moved.  We will contact you as soon as possible if a change in schedule is required.   4. No aspirin/ibuprofen for one week before surgery.  If you are on aspirin, any non-steroidal anti-inflammatory medications (Mobic, Aleve, Ibuprofen) should not be taken seven (7) days prior to your surgery.  You make take Tylenol for pain prior to surgery.  5. Medications - If you are taking daily heart and blood pressure medications, seizure, reflux, allergy, asthma, anxiety, pain or diabetes medications, make sure you notify the surgery center/hospital before the day of surgery so they can tell you which medications you should take or avoid the day of surgery. 6. No food or drink after midnight the night before surgery unless directed otherwise by surgical center/hospital staff. 7. No alcoholic beverages 24-hours prior to surgery.  No smoking 24-hours prior or 24-hours after  surgery. 8. Wear loose pants or shorts. They should be loose enough to fit over bandages, boots, and casts. 9. Don't wear slip-on shoes. Sneakers are preferred. 10. Bring your boot with you to the surgery center/hospital.  Also bring crutches or a walker if your physician has prescribed it for you.  If you do not have this equipment, it will be provided for you after surgery. 11. If you have not been contacted by the surgery center/hospital by the day before your surgery, call to confirm the date and time of your surgery. 12. Leave-time from work may vary depending on the type of surgery you have.  Appropriate arrangements should be made prior to surgery with your employer. 13. Prescriptions will be provided immediately following surgery by your doctor.  Fill these as soon as possible after surgery and take the medication as directed. Pain medications will not be refilled on weekends and must be approved by the doctor. 14. Remove nail polish on the operative foot and avoid getting pedicures prior to surgery. 15. Wash the night before surgery.  The night before surgery wash the foot and leg well with water and the antibacterial soap provided. Be sure to pay special attention to beneath the toenails and in between the toes.  Wash for at least three (3) minutes. Rinse thoroughly with water and dry well with a towel.  Perform this wash unless told not to do so by your physician.  Enclosed: 1 Ice pack (please put in freezer the night before surgery)   1 Hibiclens skin cleaner     Pre-op instructions  If you have any questions regarding the instructions, please do not hesitate to call our office.  [place]: [address], [place], [place] [address] -- [phone]  [place]: [address] [place], [place] [address] -- [phone]  [place]: [address]  [place], [place] [address] -- [phone]  [place]: [address], [address], [place], [place] [address] -- [phone]  Website:  [email or web address] 

## 2017-06-03 ENCOUNTER — Telehealth: Payer: Self-pay | Admitting: *Deleted

## 2017-06-03 NOTE — Telephone Encounter (Signed)
"  I tried to register with the surgical center and for some reason it's not going through.  I am not scheduled for surgery yet."  Have you signed consent forms?  "Yes, I signed them when I was there last."  We have you scheduled for 06/16/2017.  "Already, I didn't tell the girl I could do it that date but okay."  You can go on-line and retry the registration, you needed a date in order for it to process it correctly.  "Okay, thank you so much for your help."

## 2017-06-08 DIAGNOSIS — F250 Schizoaffective disorder, bipolar type: Secondary | ICD-10-CM | POA: Diagnosis not present

## 2017-06-08 DIAGNOSIS — F410 Panic disorder [episodic paroxysmal anxiety] without agoraphobia: Secondary | ICD-10-CM | POA: Diagnosis not present

## 2017-06-08 DIAGNOSIS — F429 Obsessive-compulsive disorder, unspecified: Secondary | ICD-10-CM | POA: Diagnosis not present

## 2017-06-08 DIAGNOSIS — G2401 Drug induced subacute dyskinesia: Secondary | ICD-10-CM | POA: Diagnosis not present

## 2017-06-12 ENCOUNTER — Telehealth: Payer: 59 | Admitting: Nurse Practitioner

## 2017-06-12 DIAGNOSIS — J0100 Acute maxillary sinusitis, unspecified: Secondary | ICD-10-CM

## 2017-06-12 MED ORDER — LEVOFLOXACIN 500 MG PO TABS: 500.0000 mg | ORAL_TABLET | Freq: Every day | ORAL | 0 refills | Status: DC

## 2017-06-12 NOTE — Progress Notes (Signed)
I actually called patient too discuss symptoms because it sounded more like she had the flu to me. She said she had the flu last year and this is not how she felt. I went ahead and treated her for a sinus infection and told her if antibiotic did nt help then she probably had flu. She had been symptomatic for 3 days which was out f window t treat with tamiflu anyway.       We are sorry that you are not feeling well.  Here is how we plan to help!  Based on what you have shared with me it looks like you have sinusitis.  Sinusitis is inflammation and infection in the sinus cavities of the head.  Based on your presentation I believe you most likely have Acute Bacterial Sinusitis.  This is an infection caused by bacteria and is treated with antibiotics. I have prescribed Levofloxicin 500mg  by mouth once daily for 7 days. You may use an oral decongestant such as Mucinex D or if you have glaucoma or high blood pressure use plain Mucinex. Saline nasal spray help and can safely be used as often as needed for congestion.  If you develop worsening sinus pain, fever or notice severe headache and vision changes, or if symptoms are not better after completion of antibiotic, please schedule an appointment with a health care provider.    Sinus infections are not as easily transmitted as other respiratory infection, however we still recommend that you avoid close contact with loved ones, especially the very young and elderly.  Remember to wash your hands thoroughly throughout the day as this is the number one way to prevent the spread of infection!  Home Care:  Only take medications as instructed by your medical team.  Complete the entire course of an antibiotic.  Do not take these medications with alcohol.  A steam or ultrasonic humidifier can help congestion.  You can place a towel over your head and breathe in the steam from hot water coming from a faucet.  Avoid close contacts especially the very young  and the elderly.  Cover your mouth when you cough or sneeze.  Always remember to wash your hands.  Get Help Right Away If:  You develop worsening fever or sinus pain.  You develop a severe head ache or visual changes.  Your symptoms persist after you have completed your treatment plan.  Make sure you  Understand these instructions.  Will watch your condition.  Will get help right away if you are not doing well or get worse.  Your e-visit answers were reviewed by a board certified advanced clinical practitioner to complete your personal care plan.  Depending on the condition, your plan could have included both over the counter or prescription medications.  If there is a problem please reply  once you have received a response from your provider.  Your safety is important to [place].  If you have drug allergies check your prescription carefully.    You can use MyChart to ask questions about today's visit, request a non-urgent call back, or ask for a work or school excuse for 24 hours related to this e-Visit. If it has been greater than 24 hours you will need to follow up with your provider, or enter a new e-Visit to address those concerns.  You will get an e-mail in the next two days asking about your experience.  I hope that your e-visit has been valuable and will speed your recovery. Thank you  for using e-visits.

## 2017-06-17 MED FILL — REXULTI 0.25 MG TAB: 0.25 | 30 days supply | Qty: 30 | Fill #0

## 2017-06-28 MED FILL — PALIPERIDONE ER 3 MG TABLET: 3 | 30 days supply | Qty: 30 | Fill #1

## 2017-06-28 MED FILL — lamoTRIgine 25 MG TABS: 25 | 30 days supply | Qty: 60 | Fill #0

## 2017-07-07 MED FILL — clonazePAM 0.5 MG TABS: 0.5 | 30 days supply | Qty: 45 | Fill #0

## 2017-07-09 ENCOUNTER — Encounter: Payer: Self-pay | Admitting: Podiatry

## 2017-07-09 DIAGNOSIS — M722 Plantar fascial fibromatosis: Secondary | ICD-10-CM | POA: Diagnosis not present

## 2017-07-09 DIAGNOSIS — M25572 Pain in left ankle and joints of left foot: Secondary | ICD-10-CM | POA: Diagnosis not present

## 2017-07-09 DIAGNOSIS — E7800 Pure hypercholesterolemia, unspecified: Secondary | ICD-10-CM | POA: Diagnosis not present

## 2017-07-09 DIAGNOSIS — M216X2 Other acquired deformities of left foot: Secondary | ICD-10-CM | POA: Diagnosis not present

## 2017-07-09 DIAGNOSIS — M62462 Contracture of muscle, left lower leg: Secondary | ICD-10-CM | POA: Diagnosis not present

## 2017-07-09 MED FILL — PROMETHAZINE 25 MG TABLET: 25 | 7 days supply | Qty: 20 | Fill #0

## 2017-07-09 MED FILL — HYDROmorphone HCL 4 MG TABS: 4 | 5 days supply | Qty: 25 | Fill #0

## 2017-07-12 ENCOUNTER — Telehealth: Payer: Self-pay | Admitting: *Deleted

## 2017-07-12 ENCOUNTER — Telehealth: Payer: Self-pay | Admitting: Podiatry

## 2017-07-12 NOTE — Telephone Encounter (Signed)
Pt asked if she could take the ace wrap off. I told her if she felt it was tight she could but needed to elevate her foot for 15 minutes then after 15 minutes rewrap looser starting at the toes and going up the foot and leg.

## 2017-07-12 NOTE — Telephone Encounter (Deleted)
POST OP CALL-    1) General condition stated by the patient:   2) Is the pt having pain?   3) Pain score:   4) Has the pt taken Rx'd medication?   5) Is the pain medication giving relief?  6) Any fever, chills, nausea, or vomiting?  7) Any shortness of breath or tightness in the calf?  8) Is the bandages clean, dry and intact?  9) Is the bandage excessively tight?  10) Is there excessive bleeding or drainage coming through the bandage?  11) Did you understand all of the post op instruction sheet given?  12) Any questions or concerns regarding post op care/recovery?    Confirmed POV appointment with patient

## 2017-07-12 NOTE — Telephone Encounter (Signed)
I had surgery on Friday. I have a question about my surgery. If you could please call me back at [phone].

## 2017-07-12 NOTE — Telephone Encounter (Signed)
L/M for pt to call with any concerns or questions. Confirmed POV appointment

## 2017-07-13 ENCOUNTER — Telehealth: Payer: Self-pay | Admitting: Podiatry

## 2017-07-13 NOTE — Telephone Encounter (Signed)
I have a question about my boot. I was wondering if you could please give me a call back at [phone]. Thank you.

## 2017-07-13 NOTE — Telephone Encounter (Signed)
I spoke with pt and she asked if she had to be in the boot at all times and I told her she had to be in the boot at all times, but take it off if just resting and rotate the foot a little.

## 2017-07-15 ENCOUNTER — Encounter: Payer: Self-pay | Admitting: Podiatry

## 2017-07-15 ENCOUNTER — Ambulatory Visit (INDEPENDENT_AMBULATORY_CARE_PROVIDER_SITE_OTHER): Payer: Self-pay | Admitting: Podiatry

## 2017-07-15 VITALS — BP 134/77 | HR 78 | Temp 96.4°F

## 2017-07-15 DIAGNOSIS — M722 Plantar fascial fibromatosis: Secondary | ICD-10-CM

## 2017-07-15 NOTE — Progress Notes (Signed)
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  Date of surgery 07/09/2017.  Denies fever chills nausea vomiting muscle aches and pains.  States that she has been utilizing her Cam walker and her mother states that she has been walking too much.  Objective: Vital signs are stable alert and oriented x3 dry sterile dressing intact was removed demonstrates no erythema mild edema no cellulitis drainage or odor no tenderness on palpation of the plantar fascia.  She has some tenderness on palpation with some ecchymosis around the incision site in the calf however it does appear that this is going on to heal uneventfully.  Assessment: Well-healing surgical foot and leg left.  Plan: Redressed today dresser compressive dressing continue to keep this dry elevated continue to wear the cam walker 24 hours a day.  Follow-up with her in 1 week for suture and staple removal.

## 2017-07-19 MED FILL — REXULTI 0.25 MG TAB: 0.25 | 30 days supply | Qty: 30 | Fill #1

## 2017-07-22 ENCOUNTER — Encounter: Payer: Self-pay | Admitting: Podiatry

## 2017-07-22 ENCOUNTER — Ambulatory Visit (INDEPENDENT_AMBULATORY_CARE_PROVIDER_SITE_OTHER): Payer: 59 | Admitting: Podiatry

## 2017-07-22 DIAGNOSIS — M722 Plantar fascial fibromatosis: Secondary | ICD-10-CM

## 2017-07-22 NOTE — Progress Notes (Signed)
She presents today for follow-up of her gastroc recession and endoscopic plantar fasciotomy of her left foot.  Date of surgery is 07/09/2017.  She states that is feeling much better this week.  Though it is more swollen.  Objective: Vital signs are stable she is alert and oriented x3.  Dry sterile dressing was removed demonstrates no erythema edema cellulitis drainage or odor went ahead and removed all of her sutures this week and remove removed all of her staples margins remain well coapted.  Assessment: Well-healing surgical foot and leg.  Plan: I encouraged her to continue use of her Cam walker and I will follow-up with her in 2 weeks.

## 2017-07-27 ENCOUNTER — Telehealth: Payer: Self-pay | Admitting: Podiatry

## 2017-07-27 NOTE — Telephone Encounter (Signed)
I have a couple of questions about my walking boot. If you could please call me back at [phone]. Thank you so much.

## 2017-07-27 NOTE — Telephone Encounter (Signed)
Pt asked if she had to sleep in the walking boot, Dr. Milinda Pointer stated she could walk in it now. I told pt she did not have to sleep in the boot, but must walk in it even short distance.

## 2017-07-29 ENCOUNTER — Ambulatory Visit (INDEPENDENT_AMBULATORY_CARE_PROVIDER_SITE_OTHER): Payer: 59 | Admitting: Podiatry

## 2017-07-29 ENCOUNTER — Encounter: Payer: Self-pay | Admitting: Podiatry

## 2017-07-29 DIAGNOSIS — M722 Plantar fascial fibromatosis: Secondary | ICD-10-CM

## 2017-07-29 NOTE — Progress Notes (Signed)
She presents today with her son and husband for a follow-up of their gastroc recession left and endoscopic fasciotomy left.  She states that she is doing pretty well continues with Cam walker.  Denies calf pain chest pain shortness of breath.  Objective: Vital signs are stable alert and oriented x3.  There is no erythema edema cellulitis drainage or odor she has mild tenderness on palpation of the plantar fascia site but I think this is more due to bruising and tenderness.  Assessment: Well-healing surgical foot and leg left.  Plan: Allow her to get back into her regular tissues.  On follow-up with her in a couple of weeks to make sure she is doing well.  She will start l light duty

## 2017-08-03 DIAGNOSIS — F250 Schizoaffective disorder, bipolar type: Secondary | ICD-10-CM | POA: Diagnosis not present

## 2017-08-03 DIAGNOSIS — F410 Panic disorder [episodic paroxysmal anxiety] without agoraphobia: Secondary | ICD-10-CM | POA: Diagnosis not present

## 2017-08-03 DIAGNOSIS — F429 Obsessive-compulsive disorder, unspecified: Secondary | ICD-10-CM | POA: Diagnosis not present

## 2017-08-03 MED FILL — PALIPERIDONE ER 3 MG TABLET: 3 | 30 days supply | Qty: 30 | Fill #0

## 2017-08-03 MED FILL — clonazePAM 0.5 MG TABS: 0.5 | 30 days supply | Qty: 45 | Fill #0

## 2017-08-03 MED FILL — lamoTRIgine 100 MG TABS: 100 | 30 days supply | Qty: 30 | Fill #0

## 2017-08-04 ENCOUNTER — Telehealth: Payer: Self-pay | Admitting: Podiatry

## 2017-08-04 NOTE — Telephone Encounter (Signed)
Patient called and wanted to know if someone faxed over any notes for her to be on restrictions? She wasn't at work and doesn't know if someone picked it up by chance. If no one has if you can fax it over to [phone] and also give her a call to let her know at [phone].

## 2017-08-12 ENCOUNTER — Encounter: Payer: Self-pay | Admitting: Podiatry

## 2017-08-12 ENCOUNTER — Ambulatory Visit (INDEPENDENT_AMBULATORY_CARE_PROVIDER_SITE_OTHER): Payer: 59 | Admitting: Podiatry

## 2017-08-12 DIAGNOSIS — M722 Plantar fascial fibromatosis: Secondary | ICD-10-CM

## 2017-08-12 NOTE — Progress Notes (Signed)
She presents today for follow-up of her gastroc recession and endoscopic plantar fasciotomy.  Date of surgery July 09, 2017.  She states that it is better but it still hurts in the calf some.  Objective: Vital signs are stable she is alert and oriented x3 has tenderness in the surgical site along the recession portion of the calf she also has some tenderness on palpation of the plantar f fasciotomy..  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  Plan: At this point am requesting physical therapy we will do this in [place] and I will follow-up with her in 6 weeks.

## 2017-08-13 ENCOUNTER — Telehealth: Payer: 59 | Admitting: Family

## 2017-08-13 DIAGNOSIS — R21 Rash and other nonspecific skin eruption: Secondary | ICD-10-CM | POA: Diagnosis not present

## 2017-08-13 NOTE — Progress Notes (Signed)
Thank you for the details you included in the comment boxes. Those details are very helpful in determining the best course of treatment for you and help [place] to provide the best care. Typically, the rash with Lamictal is red then worsens into blisters rather than improving. It is likely not the lamictal as I have given this to patients for years and am familiar with this rash. However, there is a small chance you could have a different presentation of a rash from a Lamictal reaction. Please call the prescriber's office for the Lamictal ASAP and ask them if they would like to change you to another medication TODAY.   As far as the rash is concerned, I would use moisturizing lotion such as Jergen's (the sensitive skin one) for now as this is likely something internal rather than something external bothering your skin.   E Visit for Rash  We are sorry that you are not feeling well. Here is how we plan to help!   Based on what you shared with me you may have a virus or an allergic reaction.  Avoid contact with pregnant women until a diagnosis is made.  Most viral rashes are contagious (especially if a fever is present).  You can return to work or school after the rash is gone or when your doctor says it is safe to return with the rash.    HOME CARE:   Take cool showers and avoid direct sunlight.  Apply cool compress or wet dressings.  Take a bath in an oatmeal bath.  Sprinkle content of one Aveeno packet under running faucet with comfortably warm water.  Bathe for 15-20 minutes, 1-2 times daily.  Pat dry with a towel. Do not rub the rash.  Use hydrocortisone cream.  Take an antihistamine like Benadryl for widespread rashes that itch.  The adult dose of Benadryl is 25-50 mg by mouth 4 times daily.  Caution:  This type of medication may cause sleepiness.  Do not drink alcohol, drive, or operate dangerous machinery while taking antihistamines.  Do not take these medications if you have prostate  enlargement.  Read package instructions thoroughly on all medications that you take.  GET HELP RIGHT AWAY IF:   Symptoms don't go away after treatment.  Severe itching that persists.  If you rash spreads or swells.  If you rash begins to smell.  If it blisters and opens or develops a yellow-brown crust.  You develop a fever.  You have a sore throat.  You become short of breath.  MAKE SURE YOU:  Understand these instructions. Will watch your condition. Will get help right away if you are not doing well or get worse.  Thank you for choosing an e-visit. Your e-visit answers were reviewed by a board certified advanced clinical practitioner to complete your personal care plan. Depending upon the condition, your plan could have included both over the counter or prescription medications. Please review your pharmacy choice. Be sure that the pharmacy you have chosen is open so that you can pick up your prescription now.  If there is a problem you may message your provider in [place] to have the prescription routed to another pharmacy. Your safety is important to [place]. If you have drug allergies check your prescription carefully.  For the next 24 hours, you can use MyChart to ask questions about today's visit, request a non-urgent call back, or ask for a work or school excuse from your e-visit provider. You will get an email in  the next two days asking about your experience. I hope that your e-visit has been valuable and will speed your recovery.

## 2017-08-18 ENCOUNTER — Telehealth: Payer: 59 | Admitting: Family Medicine

## 2017-08-18 ENCOUNTER — Telehealth: Payer: Self-pay | Admitting: Family Medicine

## 2017-08-18 DIAGNOSIS — R21 Rash and other nonspecific skin eruption: Secondary | ICD-10-CM

## 2017-08-18 NOTE — Progress Notes (Signed)
Based on what you shared with me it looks like you have a condition that should be evaluated in a face to face office visit. Please see the locations below for immediate evaluation or follow up with your PCP for evaluation of an exacerbation of a chronic condition.  NOTE: If you entered your credit card information for this eVisit, you will not be charged. You may see a "hold" on your card for the $30 but that hold will drop off and you will not have a charge processed.  If you are having a true medical emergency please call 911.  If you need an urgent face to face visit, [facility] has four urgent care centers for your convenience.  If you need care fast and have a high deductible or no insurance consider:   [email or web address] to reserve your spot online an avoid wait times  [facility] [address], [address] [place], [place] [address] 8 am to 8 pm Monday-Friday 10 am to 4 pm Saturday-Sunday *Across the street from [employer]  [place], [address] 8 am to 5 pm Monday-Friday * In the [facility] on the [facility]   The following sites will take your  insurance:  . [facility] [place] a Provider at this Location  [address] [place], [place] [address] . 10 am to 8 pm Monday-Friday . 12 pm to 8 pm Saturday-Sunday   . [facility] at [place] a Provider at this Location  [address] [place], [place], [place] [address] . 8 am to 8 pm Monday-Friday . 9 am to 6 pm Saturday . 11 am to 6 pm Sunday   . [facility] at [place] Get Driving Directions  [address] [place], [place] [address] . 8 am to 8 pm Monday-Friday . 8 am to 4 pm Saturday-Sunday   Your e-visit answers were reviewed by a board certified advanced clinical  practitioner to complete your personal care plan.  Thank you for using e-Visits.

## 2017-08-18 NOTE — Telephone Encounter (Signed)
Error

## 2017-08-24 ENCOUNTER — Ambulatory Visit: Payer: 59 | Admitting: Rehabilitative and Restorative Service Providers"

## 2017-08-24 ENCOUNTER — Encounter: Payer: Self-pay | Admitting: Rehabilitative and Restorative Service Providers"

## 2017-08-24 DIAGNOSIS — M79605 Pain in left leg: Secondary | ICD-10-CM

## 2017-08-24 DIAGNOSIS — R29898 Other symptoms and signs involving the musculoskeletal system: Secondary | ICD-10-CM

## 2017-08-24 DIAGNOSIS — R2689 Other abnormalities of gait and mobility: Secondary | ICD-10-CM

## 2017-08-24 MED FILL — REXULTI 0.25 MG TAB: 0.25 | 30 days supply | Qty: 30 | Fill #0

## 2017-08-24 NOTE — Therapy (Signed)
Allison Cunningham, [place], [address] Phone: [phone]   Fax:  [phone]  Physical Therapy Evaluation  Patient Details  Name: Allison Cunningham MRN: [id number] Date of Birth: 10/14/1980 Referring Provider: Dr Allison Cunningham    Encounter Date: 08/24/2017  PT End of Session - 08/24/17 0930    Visit Number  1    Number of Visits  12    Date for PT Re-Evaluation  10/05/17    PT Start Time  0930    PT Stop Time  1030    PT Time Calculation (min)  60 min    Activity Tolerance  Patient tolerated treatment well       Past Medical History:  Diagnosis Date  . Anxiety   . Dizziness   . Palpitations   . Pre-syncope   . Syncope and collapse    has no happened in last year--last 2013    Past Surgical History:  Procedure Laterality Date  . CESAREAN SECTION     x2    There were no vitals filed for this visit.   Subjective Assessment - 08/24/17 0934    Subjective  Patient reports that she has had paiin in both feet and legs since she was a child. She experienced increased symptoms over the past several months. She underwent Lt fasciotomy and gastroc recessiion 07/09/17. She was in a walking boot for 4-5 weeks and is now in tennis shoes. She has noticed improvement in symptoms since surgery but progress is slow.     Pertinent History  HTN; elevated cholostrol; mental health problems; anxiety; depression; chronic foot and leg pain    How long can you stand comfortably?  2 min     How long can you walk comfortably?  2 min     Diagnostic tests  xrays    Patient Stated Goals  get rid of foot and leg pain; improve walking to walk without limp     Currently in Pain?  Yes    Pain Score  5     Pain Location  Leg    Pain Orientation  Left;Posterior    Pain Descriptors / Indicators  Throbbing;Pins and needles sharp at times     Pain Type  Chronic pain    Pain Radiating Towards  toes to posterior knee - into foot     Pain Onset  More than a month ago    Pain Frequency  Constant    Aggravating Factors   prolonged standing; walking; sitting for prolonged periods of time     Pain Relieving Factors  propping LE; meds          [facility] PT Assessment - 08/24/17 0001      Assessment   Medical Diagnosis  Lt plantar fasciitis     Referring Provider  Dr Allison Cunningham     Onset Date/Surgical Date  07/09/17    Hand Dominance  Right    Next MD Visit  6/19    Prior Therapy  none      Precautions   Precautions  None      Restrictions   Weight Bearing Restrictions  No      Balance Screen   Has the patient fallen in the past 6 months  Yes    How many times?  2-3    Has the patient had a decrease in activity level because of a fear of falling?   No    Is  the patient reluctant to leave their home because of a fear of falling?   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, check in patients - walking throughout the day 10 hr/day 4 days/wk    Leisure  household chores; caring for 17 yr son       Observation/Other Assessments   Focus on Therapeutic Outcomes (FOTO)   60% limitation       Sensation   Light Touch  -- hypersensitive to touch Lt leg     Additional Comments  pins and needles in Lt calf and foot with touch       Posture/Postural Control   Posture Comments  head forward; shoulders rounded and elavated; weight shifted to the Rt in standing; Lt LE in ER with hip and knee flexed       AROM   Overall AROM Comments  Pt unable to extend hip and knee in supine position - lies with Lt > Rt LE in ER with hip and knee flexed     Right/Left Hip  -- tight Rt hip flexion; ext; rotation     Right Knee Extension  0    Right Knee Flexion  126    Left Knee Extension  -22 painful     Left Knee Flexion  104    Right Ankle Dorsiflexion  -5    Right Ankle Plantar Flexion  58    Left Ankle Dorsiflexion  -14    Left Ankle Plantar Flexion  55      Strength   Right Hip Flexion  5/5    Right Hip Extension  5/5    Right Hip ABduction  5/5    Left Hip Flexion  4/5    Left Hip Extension  4-/5    Left Hip ABduction  4/5    Right Knee Flexion  4-/5 painful - applying resistance through shoe     Right Knee Extension  4-/5    Left Knee Flexion  4+/5    Left Knee Extension  4+/5    Right Ankle Dorsiflexion  5/5    Right Ankle Plantar Flexion  4/5    Left Ankle Dorsiflexion  4-/5    Left Ankle Plantar Flexion  3/5      Flexibility   Hamstrings  unable to fully extend knee with SLR - tight Rt at ~ 40 deg; Lt 25-30 deg - painful through HS, posterior knee, calf     Quadriceps  prone knee flex Rt 120 deg; Lt 104 deg     ITB  tight bilat Lt > Rt     Piriformis  tight Lt > Rt       Palpation   Palpation comment  hypersensitive to palpation/touch through the posterior Lt calf and tight to palpation in Lt hamstring       Bed Mobility   Bed Mobility  -- difficulty with all transitioinal movements       Transfers   Comments  wt shifted to Rt with sit to stand  Ambulation/Gait   Gait Comments  ambulate w/ Lt LE in flexion and ER; limp on Lt LE with wt bearing on Lt      Balance   Balance Assessed  -- SLS 4-5 sec each LE with UE support                 Objective measurements completed on examination: See above findings.      [facility] PT Treatment/Exercise - 08/24/17 0001      Neuro Re-ed    Neuro Re-ed Details   worked with patient to encourage extension of Lt LE in varying positions      Knee/Hip Exercises: Stretches   Passive Hamstring Stretch  Left;2 reps;20 seconds attempted HS supine with strap - difficulty tolerating     Other Knee/Hip Stretches   ankle pumps x 10 LE supported on pillow x 10     Other Knee/Hip Stretches  ankle circles x 10 CW/CCW      Moist Heat Therapy   Number Minutes Moist Heat  14 Minutes    Moist Heat Location  Knee thigh and calf Lt - LE supported on bolster       Manual Therapy   Manual therapy comments  instructed in desentization activities for Lt LE              PT Education - 08/24/17 1018    Education provided  Yes    Education Details  HEP rehab process; desentization activities     Person(s) Educated  Patient    Methods  Explanation;Demonstration;Tactile cues;Verbal cues;Handout    Comprehension  Verbalized understanding;Returned demonstration;Verbal cues required;Tactile cues required          PT Long Term Goals - 08/24/17 1255      PT LONG TERM GOAL #1   Title  Decrease pain by 50-75% allowing patient to participate in more normal work and functional activities 10/05/17    Time  6    Period  Weeks    Status  New      PT LONG TERM GOAL #2   Title  Increase Lt LE ROM to full knee extension and neutral ankle DF 10/05/17    Time  6    Period  Weeks    Status  New      PT LONG TERM GOAL #3   Title  Improve gait pattern with patient to stand and walk with decreased ER of LE's 10/05/17    Time  6    Period  Weeks    Status  New      PT LONG TERM GOAL #4   Title  Independent in HEP 10/05/17    Time  6    Period  Weeks    Status  New      PT LONG TERM GOAL #5   Title  Improve FOTO to </= 45% lmitation 10/05/17    Time  6    Period  Weeks    Status  New             Plan - 08/24/17 1225    Clinical Impression Statement  Allison Cunningham presents with history of chronic bilat LE pain in legs and feet Lt > Rt. She underwent Lt fasciotomy and gastroc recession 07/09/17. She continues to have pain and significant limitations in ROM through Lt > Rt LE - patient is unable to straighten LE's in supine or standing. She has hypersensitivity to touch through posterior Lt  hamstring and knee  and  through the leg as well as weakness in Lt LE throughout. She has abnormal gait pattern and limited tolerance for standing and  walking. Patient will benefit from PT  to address problems identified.        History and Personal Factors relevant to plan of care:  chronic foot and leg pain - since childhood     Clinical Presentation  Evolving    Clinical Presentation due to:  significant limitations in ROM Lt > Rt LE; LE weakness; abnormal posture; abnormal gait pattern     Clinical Decision Making  Moderate    Rehab Potential  Good    PT Frequency  2x / week    PT Duration  6 weeks    PT Treatment/Interventions  Patient/family education;ADLs/Self Care Home Management;Cryotherapy;Electrical Stimulation;Iontophoresis 4mg /ml Dexamethasone;Moist Heat;Ultrasound;Dry needling;Manual techniques;Neuromuscular re-education;Therapeutic activities;Therapeutic exercise;Gait training;Balance training    PT Next Visit Plan  review desentization techniques; work as tolerated on restoring more normal LE ROM; progress with strengthening; standing posture work; Gaffer and Agree with Plan of Care  Patient       Patient will benefit from skilled therapeutic intervention in order to improve the following deficits and impairments:  Postural dysfunction, Improper body mechanics, Pain, Increased fascial restricitons, Increased muscle spasms, Hypomobility, Decreased strength, Decreased mobility, Decreased range of motion, Decreased endurance, Decreased activity tolerance, Abnormal gait  Visit Diagnosis: Pain in left leg - Plan: PT plan of care cert/re-cert  Other symptoms and signs involving the musculoskeletal system - Plan: PT plan of care cert/re-cert  Other abnormalities of gait and mobility - Plan: PT plan of care cert/re-cert     Problem List Patient Active Problem List   Diagnosis Date Noted  . Obesity (BMI 35.0-39.9 without comorbidity) 06/08/2016  . Panic disorder 06/06/2015  .  Schizoaffective disorder, bipolar type ([place]) 06/06/2015  . 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 Other hyperlipidemia 04/18/2013  . Chest pain 04/11/2013    Anwen Cannedy Nilda Simmer PT, MPH  08/24/2017, 12:59 PM  [facility] [place], [place], [address] Phone: [phone]   Fax:  [phone]  Name: Allison Cunningham MRN: [id number] Date of Birth: November 20, 1980

## 2017-08-24 NOTE — Patient Instructions (Signed)
ANKLE: Pumps    Point toes down, then up. _10__ reps per set, __1-2_ sets per day, __several times/day  Ankle Circles    Slowly rotate right foot and ankle clockwise then counterclockwise. Gradually increase range of motion. Avoid pain. Circle __20-30__ times each direction per set. Do __1-2__ sets per session. Do _2-3___ sessions per day.   HIP: Hamstrings - Supine  Place strap around foot. Raise leg up, keeping knee straight.  Bend opposite knee to protect back if indicated. Hold 30 seconds. 3 reps per set, 2-3 sets per day  .desentization activities - touch, massage, rubbing back of left leg for 1-2 min at a time working to 5 min  Twice a day  . Work on getting your knee straight when sitting and standing

## 2017-08-27 ENCOUNTER — Telehealth: Payer: 59 | Admitting: Family

## 2017-08-27 DIAGNOSIS — J3089 Other allergic rhinitis: Secondary | ICD-10-CM | POA: Diagnosis not present

## 2017-08-27 MED ORDER — FLUTICASONE PROPIONATE 50 MCG/ACT NA SUSP: 2.0000 | Freq: Every day | NASAL | 6 refills | Status: DC

## 2017-08-27 MED FILL — FLUTICASONE PROP 50 MCG SPR: 50 | 30 days supply | Qty: 16 | Fill #0

## 2017-08-27 NOTE — Progress Notes (Signed)
Thank you for the details you included in the comment boxes. Those details are very helpful in determining the best course of treatment for you and help [place] to provide the best care. *If you are running a fever and/or feel like there is fluid/mucous in your lungs coming up, please reply to this and we can consider antibiotics.   E visit for Allergic Rhinitis We are sorry that you are not feeling well.  Here is how we plan to help!  Based on what you have shared with me it looks like you have Allergic Rhinitis.  Rhinitis is when a reaction occurs that causes nasal congestion, runny nose, sneezing, and itching.  Most types of rhinitis are caused by an inflammation and are associated with symptoms in the eyes ears or throat. There are several types of rhinitis.  The most common are acute rhinitis, which is usually caused by a viral illness, allergic or seasonal rhinitis, and nonallergic or year-round rhinitis.  Nasal allergies occur certain times of the year.  Allergic rhinitis is caused when allergens in the air trigger the release of histamine in the body.  Histamine causes itching, swelling, and fluid to build up in the fragile linings of the nasal passages, sinuses and eyelids.  An itchy nose and clear discharge are common.  I recommend the following over the counter treatments: You should take a daily dose of antihistamine and Xyzal 5 mg take 1 tablet daily  I also would recommend a nasal spray: Flonase 2 sprays into each nostril once daily  You may also benefit from eye drops such as: Systane 1-2 driops each eye twice daily as needed  HOME CARE:   You can use an over-the-counter saline nasal spray as needed  Avoid areas where there is heavy dust, mites, or molds  Stay indoors on windy days during the pollen season  Keep windows closed in home, at least in bedroom; use air conditioner.  Use high-efficiency house air filter  Keep windows closed in car, turn AC on re-circulate  Avoid  playing out with dog during pollen season  GET HELP RIGHT AWAY IF:   If your symptoms do not improve within 10 days  You become short of breath  You develop yellow or green discharge from your nose for over 3 days  You have coughing fits  MAKE SURE YOU:   Understand these instructions  Will watch your condition  Will get help right away if you are not doing well or get worse  Thank you for choosing an e-visit. Your e-visit answers were reviewed by a board certified advanced clinical practitioner to complete your personal care plan. Depending upon the condition, your plan could have included both over the counter or prescription medications. Please review your pharmacy choice. Be sure that the pharmacy you have chosen is open so that you can pick up your prescription now.  If there is a problem you may message your provider in [place] to have the prescription routed to another pharmacy. Your safety is important to [place]. If you have drug allergies check your prescription carefully.  For the next 24 hours, you can use MyChart to ask questions about today's visit, request a non-urgent call back, or ask for a work or school excuse from your e-visit provider. You will get an email in the next two days asking about your experience. I hope that your e-visit has been valuable and will speed your recovery.

## 2017-08-30 ENCOUNTER — Ambulatory Visit (INDEPENDENT_AMBULATORY_CARE_PROVIDER_SITE_OTHER): Payer: 59 | Admitting: Physical Therapy

## 2017-08-30 DIAGNOSIS — R2689 Other abnormalities of gait and mobility: Secondary | ICD-10-CM

## 2017-08-30 DIAGNOSIS — M79605 Pain in left leg: Secondary | ICD-10-CM | POA: Diagnosis not present

## 2017-08-30 DIAGNOSIS — R29898 Other symptoms and signs involving the musculoskeletal system: Secondary | ICD-10-CM

## 2017-08-30 MED FILL — PALIPERIDONE ER 3 MG TB24: 3 | 30 days supply | Qty: 30 | Fill #1

## 2017-08-30 NOTE — Patient Instructions (Addendum)
Heel Raise (Sitting)    Raise heels, keeping toes on floor.  AND TOE RAISE.  Repeat __5-10__ times per set. Do __1__ sets per session. Do __several__ sessions per day.   Dorsiflexion: Self-Mobilization (Sitting)    Feet flat, other foot forward, slide left foot back until gentle stretch is felt. Keep entire foot on floor. Hold _5-10___ seconds. Relax. Repeat __10__ times per set. Do __1__ sets per session. Do __3__ sessions per day.  Stretching: Calf - Towel    Sit with knee straight and towel looped around left foot. Gently pull on towel until stretch is felt in calf. Hold __15__ seconds. Repeat __3__ times per set. Do __2__ sets per session.   HIP: Hamstrings - Short Sitting    Rest leg on raised surface. Keep knee straight. Lift chest. Hold _15__ seconds. _2-3__ reps per set, __1_ sets per day, _3__ days per week   [facility] [place], [place] [address]  [phone] (office) [phone] (fax)

## 2017-08-30 NOTE — Therapy (Signed)
[place], [place], [address] Phone: [phone]   Fax:  [phone]  Physical Therapy Treatment  Patient Details  Name: Allison Cunningham MRN: [id number] Date of Birth: [birth year] Referring Provider: Dr. Tyson Dense   Encounter Date: 08/30/2017  PT End of Session - 08/30/17 1604    Visit Number  2    Number of Visits  12    Date for PT Re-Evaluation  10/05/17    PT Start Time  6073    PT Stop Time  1601    PT Time Calculation (min)  44 min    Activity Tolerance  Patient limited by pain    Behavior During Therapy  [facility] for tasks assessed/performed       Past Medical History:  Diagnosis Date  . Anxiety   . Dizziness   . Palpitations   . Pre-syncope   . Syncope and collapse    has no happened in last year--last 2013    Past Surgical History:  Procedure Laterality Date  . CESAREAN SECTION     x2    There were no vitals filed for this visit.  Subjective Assessment - 08/30/17 1524    Subjective  Pt reports she hasn't performed any of the exercises, nor the desensitization of her LLE, because "it's going to hurt too much to do".        Patient Stated Goals  get rid of foot and leg pain; improve walking to walk without limp     Currently in Pain?  Yes    Pain Score  5     Pain Location  Leg    Pain Orientation  Left;Posterior    Pain Descriptors / Indicators  Throbbing    Aggravating Factors   prolonged standing, walking, sitting for prolonged periods of time    Pain Relieving Factors  propping LE, meds,          [facility] PT Assessment - 08/30/17 0001      Assessment   Medical Diagnosis  Lt plantar fasciitis     Referring Provider  Dr. Tyson Dense    Onset Date/Surgical Date  07/09/17    Hand Dominance  Right        [facility] Adult PT Treatment/Exercise - 08/30/17 0001      Knee/Hip Exercises: Stretches   Passive Hamstring Stretch  Right;Left;3 reps;30 seconds;Limitations seated    Passive Hamstring Stretch Limitations  unable to tolerate supine with strap, or PTA assist.     Piriformis Stretch  Right;Left;2 reps;30 seconds    Gastroc Stretch  Right;Left;3 reps;20 seconds seated with strap     Other Knee/Hip Stretches  ankle circles x 5 reps CW/CCW, 2 sets (minimal tolerance)      Knee/Hip Exercises: Aerobic   Nustep  attempted;  pt unable to tolerate position of LE in machine.       Modalities   Modalities  Cryotherapy      Moist Heat Therapy   Number Minutes Moist Heat  -- declined, will do at home.       Cryotherapy   Number Minutes Cryotherapy  8 Minutes - time spent educating pt on parameters of ice application and technique.     Cryotherapy Location  Ankle Lt plantar fascia    Type of Cryotherapy  Ice massage (rolling foot over waterbottle)      Manual Therapy   Manual Therapy  Taping    Manual therapy comments  reviewed instructs in desentization  activities for Lt LE     Kinesiotex  Edema      Kinesiotix   Edema  2 reg Rock tape pieces, squid shape to posterior Lt calf to assist with edema reduction, desensitization, pain reduction, and improve proprioception.       Ankle Exercises: Seated   Heel Raises  Both;10 reps    Toe Raise  10 reps    Heel Slides  Right;Left;10 reps     Self care-  Educated pt on self massage to LE with roller stick to decrease fascial restrictions.  Pt returned demo on RLE, but unable to tolerate light touch to LLE.         PT Education - 08/30/17 1635    Education provided  Yes    Education Details  HEP    Person(s) Educated  Patient    Methods  Explanation;Handout;Verbal cues;Demonstration    Comprehension  Verbalized understanding;Returned demonstration          PT Long Term Goals - 08/30/17 1622      PT LONG TERM GOAL #1   Title  Decrease pain by 50-75% allowing patient to participate in more normal work and functional activities 10/05/17    Time  6    Period  Weeks      PT LONG TERM GOAL #2   Title   Increase Lt LE ROM to full knee extension and neutral ankle DF 10/05/17    Time  6    Period  Weeks    Status  On-going      PT LONG TERM GOAL #3   Title  Improve gait pattern with patient to stand and walk with decreased ER of LE's 10/05/17    Time  6    Period  Weeks    Status  On-going      PT LONG TERM GOAL #4   Title  Independent in HEP 10/05/17    Time  6    Period  Weeks    Status  On-going      PT LONG TERM GOAL #5   Title  Improve FOTO to </= 45% lmitation 10/05/17    Time  6    Period  Weeks    Status  On-going            Plan - 08/30/17 1610    Clinical Impression Statement  Pt with poor tolerance for therapeutic exercise and touch to LLE.  Encouraged pt to breathe with exercise and only stretch to tolerance, not pain.  Time spent educating patient on importance of desensitizing LE and compliance with HEP to progress towards goals.  No goals met yet; only 2nd visit.     Rehab Potential  Good    PT Frequency  2x / week    PT Duration  6 weeks    PT Treatment/Interventions  Patient/family education;ADLs/Self Care Home Management;Cryotherapy;Electrical Stimulation;Iontophoresis '4mg'$ /ml Dexamethasone;Moist Heat;Ultrasound;Dry needling;Manual techniques;Neuromuscular re-education;Therapeutic activities;Therapeutic exercise;Gait training;Balance training    PT Next Visit Plan  assess response to new HEP and tape application.     Consulted and Agree with Plan of Care  Patient       Patient will benefit from skilled therapeutic intervention in order to improve the following deficits and impairments:  Postural dysfunction, Improper body mechanics, Pain, Increased fascial restricitons, Increased muscle spasms, Hypomobility, Decreased strength, Decreased mobility, Decreased range of motion, Decreased endurance, Decreased activity tolerance, Abnormal gait  Visit Diagnosis: Pain in left leg  Other symptoms and signs involving the musculoskeletal  system  Other abnormalities of  gait and mobility     Problem List Patient Active Problem List   Diagnosis Date Noted  . Obesity (BMI 35.0-39.9 without comorbidity) 06/08/2016  . Panic disorder 06/06/2015  . Schizoaffective disorder, bipolar type ([place]) 06/06/2015  . 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 Other hyperlipidemia 04/18/2013  . Chest pain 04/11/2013   Kerin Perna, PTA 08/30/17 5:04 PM  [place], [place], [address] Phone: [phone]   Fax:  [phone]  Name: Allison Cunningham MRN: [id number] Date of Birth: 01-Aug-1980

## 2017-09-03 ENCOUNTER — Ambulatory Visit (INDEPENDENT_AMBULATORY_CARE_PROVIDER_SITE_OTHER): Payer: 59 | Admitting: Physical Therapy

## 2017-09-03 ENCOUNTER — Encounter: Payer: Self-pay | Admitting: Physical Therapy

## 2017-09-03 DIAGNOSIS — M79605 Pain in left leg: Secondary | ICD-10-CM

## 2017-09-03 DIAGNOSIS — R29898 Other symptoms and signs involving the musculoskeletal system: Secondary | ICD-10-CM | POA: Diagnosis not present

## 2017-09-03 DIAGNOSIS — R2689 Other abnormalities of gait and mobility: Secondary | ICD-10-CM

## 2017-09-03 NOTE — Therapy (Signed)
[place], [place], [address] Phone: [phone]   Fax:  [phone]  Physical Therapy Treatment  Patient Details  Name: Allison Cunningham MRN: [id number] Date of Birth: 01/11/1981 Referring Provider: Dr. Tyson Dense   Encounter Date: 09/03/2017  PT End of Session - 09/03/17 1426    Visit Number  3    Number of Visits  12    Date for PT Re-Evaluation  10/05/17    PT Start Time  0945    PT Stop Time  1023    PT Time Calculation (min)  38 min    Activity Tolerance  Patient tolerated treatment well;Patient limited by pain    Behavior During Therapy  [facility] for tasks assessed/performed       Past Medical History:  Diagnosis Date  . Anxiety   . Dizziness   . Palpitations   . Pre-syncope   . Syncope and collapse    has no happened in last year--last 2013    Past Surgical History:  Procedure Laterality Date  . CESAREAN SECTION     x2    There were no vitals filed for this visit.  Subjective Assessment - 09/03/17 0951    Subjective  Pt reporting 4/10 pain in her left foot/ankle. Plantar fascitis in R foot. Pt reporting doing some of her HEP. She reported the Ankle circles hurt.     Pertinent History  HTN; elevated cholostrol; mental health problems; anxiety; depression; chronic foot and leg pain    How long can you stand comfortably?  2 min     How long can you walk comfortably?  2 min     Diagnostic tests  xrays    Patient Stated Goals  get rid of foot and leg pain; improve walking to walk without limp     Currently in Pain?  Yes    Pain Score  4     Pain Location  Ankle    Pain Orientation  Left    Pain Descriptors / Indicators  Throbbing;Sore    Pain Type  Chronic pain    Pain Onset  More than a month ago    Pain Frequency  Constant                       [facility] Adult PT Treatment/Exercise - 09/03/17 0001      Exercises   Exercises  Ankle      Knee/Hip Exercises:  Stretches   Passive Hamstring Stretch  Left;3 reps;30 seconds      Knee/Hip Exercises: Standing   Forward Step Up  Both;1 set;10 reps;Hand Hold: 2;Step Height: 4"      Knee/Hip Exercises: Seated   Heel Slides  AROM;20 reps;Limitations      Manual Therapy   Manual therapy comments  Pt reported she couldn't tolreate the taping again      Ankle Exercises: Standing   Heel Raises  Both;10 reps    Heel Raises Limitations  less lift off on left, pt reporting soreness      Ankle Exercises: Seated   Towel Crunch  4 reps;Limitations    Towel Crunch Weights (lbs)  constant encouragment    Towel Inversion/Eversion  3 reps;Limitations    Towel Inversion/Eversion Limitations  contant encouragement    BAPS  Sitting;Level 2;Limitations    BAPS Limitations  2 minutes             PT Education - 09/03/17 8676  Education provided  Yes    Education Details  Importance of mobility and stretching to gain function    Person(s) Educated  Patient    Methods  Explanation    Comprehension  Verbalized understanding          PT Long Term Goals - 09/03/17 1436      PT LONG TERM GOAL #1   Title  Decrease pain by 50-75% allowing patient to participate in more normal work and functional activities 10/05/17    Time  6    Period  Weeks    Status  On-going      PT LONG TERM GOAL #2   Title  Increase Lt LE ROM to full knee extension and neutral ankle DF 10/05/17    Period  Weeks    Status  On-going      PT LONG TERM GOAL #3   Title  Improve gait pattern with patient to stand and walk with decreased ER of LE's 10/05/17    Time  6    Period  Weeks    Status  On-going            Plan - 09/03/17 1431    Clinical Impression Statement  Pt with poor tolerance of therapeutic exercises and required constant encouragment to reach desiginated reps. Pt also reported she pulled the tape off before she got home after her last visit due to intolerance. Pt reporting increased pain with touching and  movement of her left ankle. Pt was edu on importance of movement and desensitizing techniques of bilateral LE's. Contiue with skilled PT as pt tolerates.     Rehab Potential  Good    PT Frequency  2x / week    PT Treatment/Interventions  Patient/family education;ADLs/Self Care Home Management;Cryotherapy;Electrical Stimulation;Iontophoresis 4mg /ml Dexamethasone;Moist Heat;Ultrasound;Dry needling;Manual techniques;Neuromuscular re-education;Therapeutic activities;Therapeutic exercise;Gait training;Balance training    PT Next Visit Plan  bilateral Ankle ROM and strengtheing,     PT Home Exercise Plan  ball rolling on plantar surface of foot, towel scrunches, heel slides    Consulted and Agree with Plan of Care  Patient       Patient will benefit from skilled therapeutic intervention in order to improve the following deficits and impairments:  Postural dysfunction, Improper body mechanics, Pain, Increased fascial restricitons, Increased muscle spasms, Hypomobility, Decreased strength, Decreased mobility, Decreased range of motion, Decreased endurance, Decreased activity tolerance, Abnormal gait  Visit Diagnosis: Pain in left leg  Other symptoms and signs involving the musculoskeletal system  Other abnormalities of gait and mobility     Problem List Patient Active Problem List   Diagnosis Date Noted  . Obesity (BMI 35.0-39.9 without comorbidity) 06/08/2016  . Panic disorder 06/06/2015  . Schizoaffective disorder, bipolar type ([place]) 06/06/2015  . 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 Other hyperlipidemia 04/18/2013  . Chest pain 04/11/2013    Oretha Caprice, MPT 09/03/2017, 2:37 PM  [facility] [place], [place], [address] Phone: [phone]   Fax:  [phone]  Name: Allison Cunningham MRN: [id number] Date of Birth: [birth year]

## 2017-09-09 ENCOUNTER — Ambulatory Visit: Payer: 59 | Admitting: Rehabilitative and Restorative Service Providers"

## 2017-09-09 ENCOUNTER — Encounter: Payer: Self-pay | Admitting: Rehabilitative and Restorative Service Providers"

## 2017-09-09 DIAGNOSIS — R2689 Other abnormalities of gait and mobility: Secondary | ICD-10-CM

## 2017-09-09 DIAGNOSIS — R29898 Other symptoms and signs involving the musculoskeletal system: Secondary | ICD-10-CM | POA: Diagnosis not present

## 2017-09-09 DIAGNOSIS — M79605 Pain in left leg: Secondary | ICD-10-CM

## 2017-09-09 NOTE — Therapy (Signed)
[place], [place], [address] Phone: [phone]   Fax:  [phone]  Physical Therapy Treatment  Patient Details  Name: Allison Cunningham MRN: [id number] Date of Birth: 1980-08-05 Referring Provider: Dr Tyson Dense    Encounter Date: 09/09/2017  PT End of Session - 09/09/17 1617    Visit Number  4    Number of Visits  12    Date for PT Re-Evaluation  10/05/17    PT Start Time  1615    PT Stop Time  1657    PT Time Calculation (min)  42 min    Activity Tolerance  Patient tolerated treatment well       Past Medical History:  Diagnosis Date  . Anxiety   . Dizziness   . Palpitations   . Pre-syncope   . Syncope and collapse    has no happened in last year--last 2013    Past Surgical History:  Procedure Laterality Date  . CESAREAN SECTION     x2    There were no vitals filed for this visit.  Subjective Assessment - 09/09/17 1617    Subjective  Patient reports that her Lt foot and ankle are doing a little better. She is taking hydrocodine before coming to therapy.     Currently in Pain?  Yes    Pain Score  5     Pain Location  Ankle    Pain Orientation  Left    Pain Descriptors / Indicators  Throbbing;Sore    Pain Type  Chronic pain    Pain Radiating Towards  toes to knee posteriorly     Pain Onset  More than a month ago    Pain Frequency  Constant    Aggravating Factors   prolonged standing; walking; prolonged sitting     Pain Relieving Factors  propping Lt LE up; meds; turning toe/leg/hip out to side          [facility] PT Assessment - 09/09/17 0001      Assessment   Medical Diagnosis  Lt plantar fasciitis     Referring Provider  Dr Tyson Dense     Onset Date/Surgical Date  07/09/17    Hand Dominance  Right    Next MD Visit  6/19    Prior Therapy  none      AROM   Left Knee Extension  -27 difficulty with neutral hip position - painful    Left Ankle Dorsiflexion  -8      Special  Tests   Other special tests  patient lying prone - propped on elbows - can achieve ~ neutral rotation through bilat hips and ~ full knee extension                    [facility] Adult PT Treatment/Exercise - 09/09/17 0001      Knee/Hip Exercises: Stretches   Passive Hamstring Stretch  Left;3 reps;30 seconds    Other Knee/Hip Stretches  ankle pumps in supine and prone       Knee/Hip Exercises: Supine   Other Supine Knee/Hip Exercises  hip rotation in supine alternating ER/IR in some hip and knee flexion -     Other Supine Knee/Hip Exercises  knee extension iin supine working on knee extension - and IR through hips 5-10 sec hold x 5; IR with hip and knee extension several reps       Knee/Hip Exercises: Prone   Other Prone Exercises  lying  prone with hip and knees in extension and more neutral rotation propped on elbows 3-4 min       Ankle Exercises: Aerobic   Nustep  L$ x 5 min for ROM to pt tolerance       Ankle Exercises: Seated   Heel Slides  Left;10 reps                  PT Long Term Goals - 09/09/17 1814      PT LONG TERM GOAL #1   Title  Decrease pain by 50-75% allowing patient to participate in more normal work and functional activities 10/05/17    Time  6    Period  Weeks    Status  On-going      PT LONG TERM GOAL #2   Title  Increase Lt LE ROM to full knee extension and neutral ankle DF 10/05/17    Time  6    Period  Weeks    Status  On-going      PT LONG TERM GOAL #3   Title  Improve gait pattern with patient to stand and walk with decreased ER of LE's 10/05/17    Time  6    Period  Weeks    Status  On-going      PT LONG TERM GOAL #4   Title  Independent in HEP 10/05/17    Time  6    Period  Weeks    Status  On-going      PT LONG TERM GOAL #5   Title  Improve FOTO to </= 45% lmitation 10/05/17    Time  6    Period  Weeks    Status  On-going            Plan - 09/09/17 1801    Clinical Impression Statement  Patient presents with  improved gait pattern with increased wt shift and wt bearing through the Lt LE. She is taking pain medication prior to therapy to tolerate exercises due to hip and back pain. Patient has LE dysfunction - dating from childhood per pt report. She will benefit from consultation with orthopedist for full evaluation of hip pain and limitations. Encouraged patient to continue with neutral rotation and knee extension     Rehab Potential  Good    PT Frequency  2x / week    PT Duration  6 weeks    PT Treatment/Interventions  Patient/family education;ADLs/Self Care Home Management;Cryotherapy;Electrical Stimulation;Iontophoresis 4mg /ml Dexamethasone;Moist Heat;Ultrasound;Dry needling;Manual techniques;Neuromuscular re-education;Therapeutic activities;Therapeutic exercise;Gait training;Balance training    PT Next Visit Plan  bilateral Ankle ROM and strengtheing trial of DN for bilat hips     Consulted and Agree with Plan of Care  Patient       Patient will benefit from skilled therapeutic intervention in order to improve the following deficits and impairments:  Postural dysfunction, Improper body mechanics, Pain, Increased fascial restricitons, Increased muscle spasms, Hypomobility, Decreased strength, Decreased mobility, Decreased range of motion, Decreased endurance, Decreased activity tolerance, Abnormal gait  Visit Diagnosis: Pain in left leg  Other symptoms and signs involving the musculoskeletal system  Other abnormalities of gait and mobility     Problem List Patient Active Problem List   Diagnosis Date Noted  . Obesity (BMI 35.0-39.9 without comorbidity) 06/08/2016  . Panic disorder 06/06/2015  . Schizoaffective disorder, bipolar type ([place]) 06/06/2015  . 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 Other hyperlipidemia 04/18/2013  . Chest pain 04/11/2013    Trung Wenzl Nilda Simmer PT, MPH  09/09/2017, [place], [place], [address] Phone: [phone]   Fax:  [phone]  Name: CATHYRN DEAS MRN: [id number] Date of Birth: Aug 20, 1980

## 2017-09-10 ENCOUNTER — Telehealth: Payer: 59 | Admitting: Family

## 2017-09-10 DIAGNOSIS — J028 Acute pharyngitis due to other specified organisms: Secondary | ICD-10-CM

## 2017-09-10 DIAGNOSIS — J329 Chronic sinusitis, unspecified: Secondary | ICD-10-CM | POA: Diagnosis not present

## 2017-09-10 DIAGNOSIS — B9689 Other specified bacterial agents as the cause of diseases classified elsewhere: Secondary | ICD-10-CM | POA: Diagnosis not present

## 2017-09-10 MED ORDER — CIPROFLOXACIN HCL 500 MG PO TABS: 500.0000 mg | ORAL_TABLET | Freq: Two times a day (BID) | ORAL | 0 refills | Status: DC

## 2017-09-10 MED ORDER — BENZONATATE 100 MG PO CAPS: 100.0000 mg | ORAL_CAPSULE | Freq: Three times a day (TID) | ORAL | 0 refills | Status: DC | PRN

## 2017-09-10 MED ORDER — CIPROFLOXACIN HCL 500 MG PO TABS
500.0000 mg | ORAL_TABLET | Freq: Two times a day (BID) | ORAL | 0 refills | Status: DC
Start: 2017-09-10 — End: 2017-09-10

## 2017-09-10 NOTE — Progress Notes (Signed)
Thank you for the details you included in the comment boxes. Those details are very helpful in determining the best course of treatment for you and help [place] to provide the best care. As far as the double vision, this is not a normal result of typical infections and you should be seen face to face if this continues. The rest, we can complete below.   We are sorry that you are not feeling well.  Here is how we plan to help!  Based on what you have shared with me it looks like you have sinusitis.  Sinusitis is inflammation and infection in the sinus cavities of the head.  Based on your presentation I believe you most likely have Acute Bacterial Sinusitis.  This is an infection caused by bacteria and is treated with antibiotics. I have prescribed Cipro 500mg  by mouth twice daily for 7 days. I have also sent flonase, take 2 sprays per nare once daily. In addition to [employer] 100mg , take 1-2 caps every 8 hours as needed for cough.  You may use an oral decongestant such as Mucinex D or if you have glaucoma or high blood pressure use plain Mucinex. Saline nasal spray help and can safely be used as often as needed for congestion.  If you develop worsening sinus pain, fever or notice severe headache and vision changes, or if symptoms are not better after completion of antibiotic, please schedule an appointment with a health care provider.    Sinus infections are not as easily transmitted as other respiratory infection, however we still recommend that you avoid close contact with loved ones, especially the very young and elderly.  Remember to wash your hands thoroughly throughout the day as this is the number one way to prevent the spread of infection!  Home Care:  Only take medications as instructed by your medical team.  Complete the entire course of an antibiotic.  Do not take these medications with alcohol.  A steam or ultrasonic humidifier can help congestion.  You can place a towel over your head  and breathe in the steam from hot water coming from a faucet.  Avoid close contacts especially the very young and the elderly.  Cover your mouth when you cough or sneeze.  Always remember to wash your hands.  Get Help Right Away If:  You develop worsening fever or sinus pain.  You develop a severe head ache or visual changes.  Your symptoms persist after you have completed your treatment plan.  Make sure you  Understand these instructions.  Will watch your condition.  Will get help right away if you are not doing well or get worse.  Your e-visit answers were reviewed by a board certified advanced clinical practitioner to complete your personal care plan.  Depending on the condition, your plan could have included both over the counter or prescription medications.  If there is a problem please reply  once you have received a response from your provider.  Your safety is important to [place].  If you have drug allergies check your prescription carefully.    You can use MyChart to ask questions about today's visit, request a non-urgent call back, or ask for a work or school excuse for 24 hours related to this e-Visit. If it has been greater than 24 hours you will need to follow up with your provider, or enter a new e-Visit to address those concerns.  You will get an e-mail in the next two days asking about your  experience.  I hope that your e-visit has been valuable and will speed your recovery. Thank you for using e-visits.

## 2017-09-10 NOTE — Addendum Note (Signed)
Addended by: Benjamine Mola on: 09/10/2017 01:38 PM   Modules accepted: Orders

## 2017-09-16 ENCOUNTER — Encounter: Payer: 59 | Admitting: Rehabilitative and Restorative Service Providers"

## 2017-09-17 ENCOUNTER — Ambulatory Visit (INDEPENDENT_AMBULATORY_CARE_PROVIDER_SITE_OTHER): Payer: 59 | Admitting: Physical Therapy

## 2017-09-17 DIAGNOSIS — R2689 Other abnormalities of gait and mobility: Secondary | ICD-10-CM

## 2017-09-17 DIAGNOSIS — M79605 Pain in left leg: Secondary | ICD-10-CM

## 2017-09-17 DIAGNOSIS — R29898 Other symptoms and signs involving the musculoskeletal system: Secondary | ICD-10-CM | POA: Diagnosis not present

## 2017-09-17 MED FILL — BENZONATATE 100 MG CAPS: 100 | 5 days supply | Qty: 30 | Fill #0

## 2017-09-17 MED FILL — CIPROFLOXACIN HCL 500 MG TA: 500 | 10 days supply | Qty: 20 | Fill #0

## 2017-09-17 NOTE — Patient Instructions (Signed)
KNEE: Extension, Long Arc Quads - Sitting    Raise leg until knee is straight. __10_ reps per set, _1-2__ sets per day, _7__ days per week    [employer] [facility] [place], [place] [address]  [phone] (office) [phone] (fax)

## 2017-09-17 NOTE — Therapy (Addendum)
[place], [place], [address] Phone: [phone]   Fax:  [phone]  Physical Therapy Treatment  Patient Details  Name: Allison Cunningham MRN: [id number] Date of Birth: 08/23/1980 Referring Provider: Dr Tyson Dense    Encounter Date: 09/17/2017  PT End of Session - 09/17/17 1537    Visit Number  5    Number of Visits  12    Date for PT Re-Evaluation  10/05/17    PT Start Time  1534    PT Stop Time  1617    PT Time Calculation (min)  43 min    Activity Tolerance  Patient tolerated treatment well    Behavior During Therapy  [facility] for tasks assessed/performed       Past Medical History:  Diagnosis Date  . Anxiety   . Dizziness   . Palpitations   . Pre-syncope   . Syncope and collapse    has no happened in last year--last 2013    Past Surgical History:  Procedure Laterality Date  . CESAREAN SECTION     x2    There were no vitals filed for this visit.  Subjective Assessment - 09/17/17 1538    Subjective  Pt reports she has been sick with sinus infection and was put on antibiotics today. She is taking pain medicine prior to treatment.  She has not done much exercise this week due to illness.      Patient Stated Goals  get rid of foot and leg pain; improve walking to walk without limp     Currently in Pain?  Yes    Pain Score  3     Pain Location  Ankle    Pain Orientation  Left    Pain Descriptors / Indicators  Sore    Aggravating Factors   prolonged sitting and standing     Pain Relieving Factors  propping LLE up          [facility] PT Assessment - 09/17/17 0001      AROM   Left Knee Extension  -25 difficulty with neutral hip position - painful    Left Ankle Dorsiflexion  -6      [facility] Adult PT Treatment/Exercise - 09/17/17 0001      Knee/Hip Exercises: Stretches   Passive Hamstring Stretch  Left;Right;2 reps;20 seconds    Passive Hamstring Stretch Limitations  unable to tolerate supine  with strap, with assist; switched to seated with improved tolerance.     Other Knee/Hip Stretches  ankle pumps in prone x 10 x 3 sets.       Knee/Hip Exercises: Standing   Other Standing Knee Exercises  Standing in staggered stance, Lt foot back, rolling through Lt foot to simulate toe off in gait x 5 reps (low tolerance)      Knee/Hip Exercises: Seated   Long Arc Quad  Left;1 set;10 reps;Right;5 reps eccentric control    Heel Slides  Left;5 reps      Knee/Hip Exercises: Prone   Other Prone Exercises  lying prone with hip and knees in extension and more neutral rotation propped on elbows 3-4 min       Manual Therapy   Manual therapy comments  small I strip of Rock tape placed over Lt calf incision with 10% stretch to assist with scar managment.       Ankle Exercises: Seated   Ankle Circles/Pumps  Left;5 reps    Heel Raises  Both;10 reps  Toe Raise  10 reps    BAPS  Sitting;Level 2 10 CW/CCW    Heel Slides  Left;5 reps                  PT Long Term Goals - 09/09/17 1814      PT LONG TERM GOAL #1   Title  Decrease pain by 50-75% allowing patient to participate in more normal work and functional activities 10/05/17    Time  6    Period  Weeks    Status  On-going      PT LONG TERM GOAL #2   Title  Increase Lt LE ROM to full knee extension and neutral ankle DF 10/05/17    Time  6    Period  Weeks    Status  On-going      PT LONG TERM GOAL #3   Title  Improve gait pattern with patient to stand and walk with decreased ER of LE's 10/05/17    Time  6    Period  Weeks    Status  On-going      PT LONG TERM GOAL #4   Title  Independent in HEP 10/05/17    Time  6    Period  Weeks    Status  On-going      PT LONG TERM GOAL #5   Title  Improve FOTO to </= 45% lmitation 10/05/17    Time  6    Period  Weeks    Status  On-going            Plan - 09/17/17 1620    Clinical Impression Statement  Pt demonstrated slight improvement in Lt knee ext and ankle DF. she  tolerated seated BAPS board well and demonstrated improved ability to complete ankle circles. Pt able to tolerate increased touch to incision on calf; 2nd trial of Rock tape applied for scar managment.   Improved tolerance for exercise with taking pain medicine prior to treatment.  Pt has not sought consult from ortho dr re: hip limitations, but plans to.  Pt progressing gradually towards goals.     Rehab Potential  Good    PT Frequency  2x / week    PT Duration  6 weeks    PT Treatment/Interventions  Patient/family education;ADLs/Self Care Home Management;Cryotherapy;Electrical Stimulation;Iontophoresis 36m/ml Dexamethasone;Moist Heat;Ultrasound;Dry needling;Manual techniques;Neuromuscular re-education;Therapeutic activities;Therapeutic exercise;Gait training;Balance training    PT Next Visit Plan  stairs; continue bilat ankle ROM and LE stretching.     Consulted and Agree with Plan of Care  Patient       Patient will benefit from skilled therapeutic intervention in order to improve the following deficits and impairments:  Postural dysfunction, Improper body mechanics, Pain, Increased fascial restricitons, Increased muscle spasms, Hypomobility, Decreased strength, Decreased mobility, Decreased range of motion, Decreased endurance, Decreased activity tolerance, Abnormal gait  Visit Diagnosis: Pain in left leg  Other symptoms and signs involving the musculoskeletal system  Other abnormalities of gait and mobility     Problem List Patient Active Problem List   Diagnosis Date Noted  . Obesity (BMI 35.0-39.9 without comorbidity) 06/08/2016  . Panic disorder 06/06/2015  . Schizoaffective disorder, bipolar type ([place] 06/06/2015  . 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 Other hyperlipidemia 04/18/2013  . Chest pain 04/11/2013   JKerin Perna PTA 09/17/17 4:32 PM  [facility] [address]  [address]Phone: [phone]  Fax:  [phone] Name: Allison  Cunningham MRN: [id number] Date of Birth: [birth year]  PHYSICAL THERAPY DISCHARGE SUMMARY  Visits from Start of Care: 5  Current functional level related to goals / functional outcomes: See last progress note for discharge status   Remaining deficits: Unknown    Education / Equipment: HEP  Plan: Patient agrees to discharge.  Patient goals were partially met. Patient is being discharged due to not returning since the last visit.  ?????     Celyn P. Helene Kelp PT, MPH 11/11/17 2:40 PM

## 2017-09-19 ENCOUNTER — Telehealth: Payer: 59 | Admitting: Family

## 2017-09-19 DIAGNOSIS — R059 Cough, unspecified: Secondary | ICD-10-CM

## 2017-09-19 DIAGNOSIS — R05 Cough: Secondary | ICD-10-CM

## 2017-09-19 DIAGNOSIS — R509 Fever, unspecified: Secondary | ICD-10-CM

## 2017-09-19 NOTE — Progress Notes (Signed)
Based on what you shared with me it looks like you have a serious condition that should be evaluated in a face to face office visit.  NOTE: If you entered your credit card information for this eVisit, you will not be charged. You may see a "hold" on your card for the $30 but that hold will drop off and you will not have a charge processed.  If you are having a true medical emergency please call 911.  If you need an urgent face to face visit, [facility] has four urgent care centers for your convenience.  If you need care fast and have a high deductible or no insurance consider:   [email or web address] to reserve your spot online an avoid wait times  [facility] [address], [address] [place], [place] [address] 8 am to 8 pm Monday-Friday 10 am to 4 pm Saturday-Sunday *Across the street from [employer]  [facility]  [address]  [place], [address] 8 am to 5 pm Monday-Friday * In the [facility]   The following sites will take your  insurance:  . [facility]  [phone] Get Driving Directions Find a Provider at this Location  [address] [place], [place] [address] . 10 am to 8 pm Monday-Friday . 12 pm to 8 pm Saturday-Sunday   . [facility]  [phone] Get Driving Directions Find a Provider at this Location  [address], [address] [place], [place] [address] . 8 am to 8 pm Monday-Friday . 9 am to 6 pm Saturday . 11 am to 6 pm Sunday   . [facility] [place]  [phone] Get Driving Directions  [address] [place], [place] [address] . 8 am to 8 pm Monday-Friday . 8 am to 4 pm Saturday-Sunday   Your e-visit answers were reviewed by a board certified advanced clinical practitioner to complete your personal care plan.  Thank you for using e-Visits.  

## 2017-09-20 ENCOUNTER — Ambulatory Visit (INDEPENDENT_AMBULATORY_CARE_PROVIDER_SITE_OTHER): Payer: Self-pay | Admitting: Nurse Practitioner

## 2017-09-20 VITALS — BP 112/78 | HR 116 | Temp 98.6°F | Resp 18 | Wt 239.4 lb

## 2017-09-20 DIAGNOSIS — J209 Acute bronchitis, unspecified: Secondary | ICD-10-CM

## 2017-09-20 MED ORDER — MONTELUKAST SODIUM 10 MG PO TABS: 10.0000 mg | ORAL_TABLET | Freq: Every day | ORAL | 0 refills | Status: DC

## 2017-09-20 MED ORDER — BENZONATATE 200 MG PO CAPS: 200.0000 mg | ORAL_CAPSULE | Freq: Two times a day (BID) | ORAL | 0 refills | Status: DC | PRN

## 2017-09-20 MED ORDER — ALBUTEROL SULFATE HFA 108 (90 BASE) MCG/ACT IN AERS: 2.0000 | INHALATION_SPRAY | Freq: Four times a day (QID) | RESPIRATORY_TRACT | 0 refills | Status: DC | PRN

## 2017-09-20 MED ORDER — PREDNISONE 10 MG (21) PO TBPK: ORAL_TABLET | ORAL | 0 refills | Status: AC

## 2017-09-20 MED FILL — MONTELUKAST SOD 10 MG TAB: 10 | 30 days supply | Qty: 30 | Fill #0

## 2017-09-20 MED FILL — clonazePAM 0.5 MG TABS: 0.5 | 30 days supply | Qty: 45 | Fill #0

## 2017-09-20 MED FILL — predniSONE 10 MG TABS: 10 | 6 days supply | Qty: 21 | Fill #0

## 2017-09-20 MED FILL — BENZONATATE 200 MG CAP: 200 | 10 days supply | Qty: 20 | Fill #0

## 2017-09-20 MED FILL — VENTOLIN HFA 90 MCG INHALER: 108 (90 BAS | 25 days supply | Qty: 18 | Fill #0

## 2017-09-20 NOTE — Progress Notes (Signed)
Subjective:     Allison Cunningham is a 37 y.o. female here for evaluation of a cough. Onset of symptoms was 3 days ago. Symptoms have been gradually worsening since that time. The cough is productive of yellowish-green sputum and is aggravated by reclining position. Associated symptoms include: fever, postnasal drip, shortness of breath, sputum production and wheezing. Patient does not have a history of asthma. Patient does have a history of environmental allergens. Patient has traveled recently. Patient does have a history of smoking. Patient has not had a previous chest x-ray.  Recently treated for a sinus infection with Cipro.  Patient thinks her symptoms have started to improve, but she has not been taking her medications but only for a few days.  The patient continues to take Mucinex, Tylenol, Tylenol cough and cold.   The following portions of the patient's history were reviewed and updated as appropriate: allergies, current medications and past medical history.  Review of Systems Constitutional: positive for fatigue and fevers, negative for anorexia, malaise, sweats and weight loss Eyes: negative Ears, nose, mouth, throat, and face: positive for nasal congestion, sore throat and bilateral ear fullness/pressure, negative for ear drainage, earaches and hoarseness Respiratory: positive for cough, dyspnea on exertion, sputum and wheezing, negative for hemoptysis, pneumonia and stridor Cardiovascular: negative Gastrointestinal: negative Neurological: positive for dizziness and headaches, negative for coordination problems, paresthesia, speech problems, tremors, vertigo and weakness Allergic/Immunologic: positive for hay fever    Objective:    BP 112/78 (BP Location: Right Arm, Patient Position: Sitting, Cuff Size: Large)   Pulse (!) 116   Temp 98.6 F (37 C) (Oral)   Resp 18   Wt 239 lb 6.4 oz (108.6 kg)   SpO2 96%   BMI 37.50 kg/m  General appearance: alert, cooperative, fatigued and no  distress Head: Normocephalic, without obvious abnormality, atraumatic Eyes: conjunctivae/corneas clear. PERRL, EOM's intact. Fundi benign. Ears: normal TM's and external ear canals both ears Nose: no discharge, moderate congestion, turbinates swollen, inflamed, mild maxillary sinus tenderness bilateral, mild frontal sinus tenderness bilateral Throat: lips, mucosa, and tongue normal; teeth and gums normal Lungs: clear to auscultation bilaterally Heart: regular rate and rhythm, S1, S2 normal, no murmur, click, rub or gallop Abdomen: soft, non-tender; bowel sounds normal; no masses,  no organomegaly Pulses: 2+ and symmetric Skin: Skin color, texture, turgor normal. No rashes or lesions Lymph nodes: cervical and submandibular nodes normal Neurologic: Grossly normal    Assessment:    Acute Bronchitis    Plan:    Explained lack of efficacy of antibiotics in viral disease. Antitussives per medication orders. Avoid exposure to tobacco smoke and fumes. B-agonist inhaler. Call if shortness of breath worsens, blood in sputum, change in character of cough, development of fever or chills, inability to maintain nutrition and hydration. Avoid exposure to tobacco smoke and fumes. She is instructed to continue antibiotic that was she was given for sinus infection previously.  Patient instructed to increase fluids, will need to sleep elevated on pillows, use humidifier at home, continue use of cough drops, may use honey or honey with lemon.  Also instructed to stop smoking

## 2017-09-20 NOTE — Patient Instructions (Signed)

## 2017-09-23 ENCOUNTER — Encounter: Payer: 59 | Admitting: Podiatry

## 2017-09-23 ENCOUNTER — Encounter: Payer: 59 | Admitting: Physical Therapy

## 2017-09-27 MED FILL — PALIPERIDONE ER 3 MG TB24: 3 | 30 days supply | Qty: 30 | Fill #0

## 2017-09-27 MED FILL — REXULTI 0.25 MG TAB: 0.25 | 30 days supply | Qty: 30 | Fill #1

## 2017-10-08 ENCOUNTER — Telehealth: Payer: 59 | Admitting: Family

## 2017-10-08 DIAGNOSIS — B3731 Acute candidiasis of vulva and vagina: Secondary | ICD-10-CM

## 2017-10-08 DIAGNOSIS — B373 Candidiasis of vulva and vagina: Secondary | ICD-10-CM | POA: Diagnosis not present

## 2017-10-08 DIAGNOSIS — F250 Schizoaffective disorder, bipolar type: Secondary | ICD-10-CM | POA: Diagnosis not present

## 2017-10-08 DIAGNOSIS — F410 Panic disorder [episodic paroxysmal anxiety] without agoraphobia: Secondary | ICD-10-CM | POA: Diagnosis not present

## 2017-10-08 DIAGNOSIS — Z79899 Other long term (current) drug therapy: Secondary | ICD-10-CM | POA: Diagnosis not present

## 2017-10-08 MED ORDER — FLUCONAZOLE 150 MG PO TABS: 150.0000 mg | ORAL_TABLET | Freq: Once | ORAL | 0 refills | Status: AC

## 2017-10-08 MED FILL — FLUCONAZOLE 150 MG TABS: 150 | 1 days supply | Qty: 1 | Fill #0

## 2017-10-08 NOTE — Progress Notes (Signed)

## 2017-10-11 DIAGNOSIS — Z79899 Other long term (current) drug therapy: Secondary | ICD-10-CM | POA: Diagnosis not present

## 2017-10-15 MED FILL — LITHIUM CARBONATE ER 300 MG: 300 | 30 days supply | Qty: 60 | Fill #0

## 2017-10-20 MED FILL — clonazePAM 0.5 MG TABS: 0.5 | 30 days supply | Qty: 45 | Fill #0

## 2017-10-22 DIAGNOSIS — Z79899 Other long term (current) drug therapy: Secondary | ICD-10-CM | POA: Diagnosis not present

## 2017-10-26 MED FILL — REXULTI 0.25 MG TAB: 0.25 | 30 days supply | Qty: 30 | Fill #0

## 2017-10-26 MED FILL — PALIPERIDONE ER 3 MG TB24: 3 | 30 days supply | Qty: 30 | Fill #0

## 2017-10-29 MED FILL — METHYLPREDNISOLONE 4 MG TAB: 4 | 6 days supply | Qty: 21 | Fill #0

## 2017-10-31 NOTE — Progress Notes (Signed)
  Subjective:  Patient ID: Allison Cunningham, female    DOB: [birth year],  MRN: [id number]  Chief Complaint  Patient presents with  . Plantar Fasciitis    Pt. stated," pain is much better. I am starting to get B/L leg cramps. Also, the facial brace is helping as well as the Meloxicam, but the night splint makes my feet hurt."   37 y.o. female returns for the above complaint.  States the pain is much better but getting leg cramps.  Fascial brace is helping  Objective:  There were no vitals filed for this visit. General AA&O x3. Normal mood and affect.  Vascular Pedal pulses palpable.  Neurologic Epicritic sensation grossly intact.  Dermatologic No open lesions. Skin normal texture and turgor.  Orthopedic: No pain to palpation either foot.   Assessment & Plan:  Patient was evaluated and treated and all questions answered.  Plantar Fasciitis -Continue current plan. F/u PRN.  No follow-ups on file.

## 2017-11-01 DIAGNOSIS — Z79899 Other long term (current) drug therapy: Secondary | ICD-10-CM | POA: Diagnosis not present

## 2017-11-03 DIAGNOSIS — E669 Obesity, unspecified: Secondary | ICD-10-CM | POA: Diagnosis not present

## 2017-11-03 DIAGNOSIS — N921 Excessive and frequent menstruation with irregular cycle: Secondary | ICD-10-CM | POA: Diagnosis not present

## 2017-11-03 DIAGNOSIS — T50905A Adverse effect of unspecified drugs, medicaments and biological substances, initial encounter: Secondary | ICD-10-CM | POA: Diagnosis not present

## 2017-11-03 DIAGNOSIS — E221 Hyperprolactinemia: Secondary | ICD-10-CM | POA: Diagnosis not present

## 2017-11-03 MED FILL — TRI-LO-MARZIA TABLET: 0.18/0.215/ | 84 days supply | Qty: 84 | Fill #0

## 2017-11-05 DIAGNOSIS — F250 Schizoaffective disorder, bipolar type: Secondary | ICD-10-CM | POA: Diagnosis not present

## 2017-11-05 DIAGNOSIS — F429 Obsessive-compulsive disorder, unspecified: Secondary | ICD-10-CM | POA: Diagnosis not present

## 2017-11-05 MED FILL — OXcarbazepine 300 MG TABS: 300 | 30 days supply | Qty: 60 | Fill #0

## 2017-11-11 ENCOUNTER — Encounter: Payer: 59 | Admitting: Podiatry

## 2017-11-22 MED FILL — clonazePAM 0.5 MG TABS: 0.5 | 30 days supply | Qty: 45 | Fill #1

## 2017-11-22 MED FILL — REXULTI 0.25 MG TAB: 0.25 | 30 days supply | Qty: 30 | Fill #1

## 2017-11-23 DIAGNOSIS — F250 Schizoaffective disorder, bipolar type: Secondary | ICD-10-CM | POA: Diagnosis not present

## 2017-11-23 DIAGNOSIS — R0683 Snoring: Secondary | ICD-10-CM | POA: Diagnosis not present

## 2017-11-23 DIAGNOSIS — F410 Panic disorder [episodic paroxysmal anxiety] without agoraphobia: Secondary | ICD-10-CM | POA: Diagnosis not present

## 2017-11-23 MED FILL — PALIPERIDONE ER 6 MG TABLET: 6 | 30 days supply | Qty: 30 | Fill #0

## 2017-11-25 DIAGNOSIS — F410 Panic disorder [episodic paroxysmal anxiety] without agoraphobia: Secondary | ICD-10-CM | POA: Diagnosis not present

## 2017-11-25 DIAGNOSIS — F250 Schizoaffective disorder, bipolar type: Secondary | ICD-10-CM | POA: Diagnosis not present

## 2017-11-25 DIAGNOSIS — F429 Obsessive-compulsive disorder, unspecified: Secondary | ICD-10-CM | POA: Diagnosis not present

## 2017-12-13 ENCOUNTER — Telehealth: Payer: 59 | Admitting: Family

## 2017-12-13 DIAGNOSIS — M545 Low back pain, unspecified: Secondary | ICD-10-CM

## 2017-12-13 MED ORDER — BACLOFEN 10 MG PO TABS: 10.0000 mg | ORAL_TABLET | Freq: Three times a day (TID) | ORAL | 0 refills | Status: DC

## 2017-12-13 NOTE — Progress Notes (Signed)
We are sorry that you are not feeling well.  Here is how we plan to help!  Based on what you have shared with me it looks like you mostly have acute back pain.  Acute back pain is defined as musculoskeletal pain that can resolve in 1-3 weeks with conservative treatment.  I have prescribed Baclofen 10 mg every eight hours as needed which is a muscle relaxer and you can continue taking your town all over the counter. Some patients experience stomach irritation or in increased heartburn with anti-inflammatory drugs.  Please keep in mind that muscle relaxer's can cause fatigue and should not be taken while at work or driving.  Back pain is very common.  The pain often gets better over time.  The cause of back pain is usually not dangerous.  Most people can learn to manage their back pain on their own.  Home Care  Stay active.  Start with short walks on flat ground if you can.  Try to walk farther each day.  Do not sit, drive or stand in one place for more than 30 minutes.  Do not stay in bed.  Do not avoid exercise or work.  Activity can help your back heal faster.  Be careful when you bend or lift an object.  Bend at your knees, keep the object close to you, and do not twist.  Sleep on a firm mattress.  Lie on your side, and bend your knees.  If you lie on your back, put a pillow under your knees.  Only take medicines as told by your doctor.  Put ice on the injured area.  Put ice in a plastic bag  Place a towel between your skin and the bag  Leave the ice on for 15-20 minutes, 3-4 times a day for the first 2-3 days. 210 After that, you can switch between ice and heat packs.  Ask your doctor about back exercises or massage.  Avoid feeling anxious or stressed.  Find good ways to deal with stress, such as exercise.  Get Help Right Way If:  Your pain does not go away with rest or medicine.  Your pain does not go away in 1 week.  You have new problems.  You do not feel well.  The  pain spreads into your legs.  You cannot control when you poop (bowel movement) or pee (urinate)  You feel sick to your stomach (nauseous) or throw up (vomit)  You have belly (abdominal) pain.  You feel like you may pass out (faint).  If you develop a fever.  Make Sure you:  Understand these instructions.  Will watch your condition  Will get help right away if you are not doing well or get worse.  Your e-visit answers were reviewed by a board certified advanced clinical practitioner to complete your personal care plan.  Depending on the condition, your plan could have included both over the counter or prescription medications.  If there is a problem please reply  once you have received a response from your provider.  Your safety is important to [place].  If you have drug allergies check your prescription carefully.    You can use MyChart to ask questions about today's visit, request a non-urgent call back, or ask for a work or school excuse for 24 hours related to this e-Visit. If it has been greater than 24 hours you will need to follow up with your provider, or enter a new e-Visit to address those concerns.  You will get an e-mail in the next two days asking about your experience.  I hope that your e-visit has been valuable and will speed your recovery. Thank you for using e-visits.

## 2017-12-14 MED FILL — BACLOFEN 10 MG TABLET: 10 | 10 days supply | Qty: 30 | Fill #0

## 2017-12-20 MED FILL — clonazePAM 0.5 MG TABS: 0.5 | 30 days supply | Qty: 45 | Fill #0

## 2017-12-20 MED FILL — REXULTI 0.25 MG TAB: 0.25 | 30 days supply | Qty: 30 | Fill #0

## 2017-12-20 MED FILL — PALIPERIDONE ER 6 MG TABLET: 6 | 30 days supply | Qty: 30 | Fill #1

## 2017-12-21 DIAGNOSIS — F250 Schizoaffective disorder, bipolar type: Secondary | ICD-10-CM | POA: Diagnosis not present

## 2017-12-21 DIAGNOSIS — F410 Panic disorder [episodic paroxysmal anxiety] without agoraphobia: Secondary | ICD-10-CM | POA: Diagnosis not present

## 2017-12-21 DIAGNOSIS — F429 Obsessive-compulsive disorder, unspecified: Secondary | ICD-10-CM | POA: Diagnosis not present

## 2018-01-06 DIAGNOSIS — F250 Schizoaffective disorder, bipolar type: Secondary | ICD-10-CM | POA: Diagnosis not present

## 2018-01-06 DIAGNOSIS — F429 Obsessive-compulsive disorder, unspecified: Secondary | ICD-10-CM | POA: Diagnosis not present

## 2018-01-06 DIAGNOSIS — F410 Panic disorder [episodic paroxysmal anxiety] without agoraphobia: Secondary | ICD-10-CM | POA: Diagnosis not present

## 2018-01-13 DIAGNOSIS — G47411 Narcolepsy with cataplexy: Secondary | ICD-10-CM | POA: Diagnosis not present

## 2018-01-13 DIAGNOSIS — G4730 Sleep apnea, unspecified: Secondary | ICD-10-CM | POA: Diagnosis not present

## 2018-01-13 DIAGNOSIS — R0683 Snoring: Secondary | ICD-10-CM | POA: Diagnosis not present

## 2018-01-13 DIAGNOSIS — G4752 REM sleep behavior disorder: Secondary | ICD-10-CM | POA: Diagnosis not present

## 2018-01-13 DIAGNOSIS — G4719 Other hypersomnia: Secondary | ICD-10-CM | POA: Diagnosis not present

## 2018-01-13 DIAGNOSIS — R442 Other hallucinations: Secondary | ICD-10-CM | POA: Diagnosis not present

## 2018-01-17 MED FILL — PALIPERIDONE ER 6 MG TABLET: 6 | 30 days supply | Qty: 30 | Fill #2

## 2018-01-17 MED FILL — clonazePAM 0.5 MG TABS: 0.5 | 30 days supply | Qty: 45 | Fill #1

## 2018-01-17 MED FILL — REXULTI 0.25 MG TAB: 0.25 | 30 days supply | Qty: 30 | Fill #1

## 2018-01-17 MED FILL — TRI-LO-MARZIA TABLET: 0.18/0.215/ | 84 days supply | Qty: 84 | Fill #1

## 2018-02-01 DIAGNOSIS — F429 Obsessive-compulsive disorder, unspecified: Secondary | ICD-10-CM | POA: Diagnosis not present

## 2018-02-01 DIAGNOSIS — F250 Schizoaffective disorder, bipolar type: Secondary | ICD-10-CM | POA: Diagnosis not present

## 2018-02-01 DIAGNOSIS — F410 Panic disorder [episodic paroxysmal anxiety] without agoraphobia: Secondary | ICD-10-CM | POA: Diagnosis not present

## 2018-02-01 MED FILL — busPIRone HCL 5 MG TABS: 5 | 30 days supply | Qty: 60 | Fill #0

## 2018-02-10 DIAGNOSIS — G4752 REM sleep behavior disorder: Secondary | ICD-10-CM | POA: Diagnosis not present

## 2018-02-10 DIAGNOSIS — R442 Other hallucinations: Secondary | ICD-10-CM | POA: Diagnosis not present

## 2018-02-10 DIAGNOSIS — G4719 Other hypersomnia: Secondary | ICD-10-CM | POA: Diagnosis not present

## 2018-02-10 DIAGNOSIS — G47411 Narcolepsy with cataplexy: Secondary | ICD-10-CM | POA: Diagnosis not present

## 2018-02-10 DIAGNOSIS — R0683 Snoring: Secondary | ICD-10-CM | POA: Diagnosis not present

## 2018-02-10 DIAGNOSIS — G4730 Sleep apnea, unspecified: Secondary | ICD-10-CM | POA: Diagnosis not present

## 2018-02-15 MED FILL — PALIPERIDONE ER 6 MG TABLET: 6 | 30 days supply | Qty: 30 | Fill #0

## 2018-02-15 MED FILL — REXULTI 0.25 MG TAB: 0.25 | 30 days supply | Qty: 30 | Fill #0

## 2018-02-21 MED FILL — clonazePAM 0.5 MG TABS: 0.5 | 30 days supply | Qty: 15 | Fill #0

## 2018-03-02 ENCOUNTER — Telehealth: Payer: 59 | Admitting: Nurse Practitioner

## 2018-03-02 DIAGNOSIS — R05 Cough: Secondary | ICD-10-CM | POA: Diagnosis not present

## 2018-03-02 DIAGNOSIS — R059 Cough, unspecified: Secondary | ICD-10-CM

## 2018-03-02 MED ORDER — AZITHROMYCIN 250 MG PO TABS: ORAL_TABLET | ORAL | 0 refills | Status: DC

## 2018-03-02 MED ORDER — BENZONATATE 100 MG PO CAPS: 100.0000 mg | ORAL_CAPSULE | Freq: Three times a day (TID) | ORAL | 0 refills | Status: DC | PRN

## 2018-03-02 MED FILL — AZITHROMYCIN 250 MG TABLET: 250 | 5 days supply | Qty: 6 | Fill #0

## 2018-03-02 MED FILL — BENZONATATE 100 MG CAPS: 100 | 6 days supply | Qty: 20 | Fill #0

## 2018-03-02 NOTE — Progress Notes (Signed)

## 2018-03-07 MED FILL — busPIRone HCL 5 MG TABS: 5 | 30 days supply | Qty: 60 | Fill #1

## 2018-03-14 MED FILL — REXULTI 0.25 MG TAB: 0.25 | 30 days supply | Qty: 30 | Fill #1

## 2018-03-14 MED FILL — PALIPERIDONE ER 6 MG TABLET: 6 | 30 days supply | Qty: 30 | Fill #1

## 2018-03-18 DIAGNOSIS — F422 Mixed obsessional thoughts and acts: Secondary | ICD-10-CM | POA: Diagnosis not present

## 2018-03-18 DIAGNOSIS — F410 Panic disorder [episodic paroxysmal anxiety] without agoraphobia: Secondary | ICD-10-CM | POA: Diagnosis not present

## 2018-03-18 DIAGNOSIS — F250 Schizoaffective disorder, bipolar type: Secondary | ICD-10-CM | POA: Diagnosis not present

## 2018-03-18 MED FILL — busPIRone HCL 10 MG TABS: 10 | 30 days supply | Qty: 90 | Fill #0

## 2018-03-23 MED FILL — clonazePAM 0.5 MG TABS: 0.5 | 30 days supply | Qty: 15 | Fill #0

## 2018-04-15 DIAGNOSIS — F429 Obsessive-compulsive disorder, unspecified: Secondary | ICD-10-CM | POA: Diagnosis not present

## 2018-04-15 DIAGNOSIS — G2401 Drug induced subacute dyskinesia: Secondary | ICD-10-CM | POA: Diagnosis not present

## 2018-04-15 DIAGNOSIS — F250 Schizoaffective disorder, bipolar type: Secondary | ICD-10-CM | POA: Diagnosis not present

## 2018-04-15 DIAGNOSIS — F410 Panic disorder [episodic paroxysmal anxiety] without agoraphobia: Secondary | ICD-10-CM | POA: Diagnosis not present

## 2018-04-15 MED FILL — CITALOPRAM HBR 10 MG TABLET: 10 | 30 days supply | Qty: 30 | Fill #0

## 2018-04-27 MED FILL — clonazePAM 0.5 MG TABS: 0.5 | 30 days supply | Qty: 15 | Fill #1

## 2018-04-27 MED FILL — TRI-LO-MARZIA TABLET: 0.18/0.215/ | 28 days supply | Qty: 28 | Fill #2

## 2018-05-09 ENCOUNTER — Telehealth: Payer: 59 | Admitting: Physician Assistant

## 2018-05-09 ENCOUNTER — Encounter: Payer: Self-pay | Admitting: Physician Assistant

## 2018-05-09 DIAGNOSIS — J019 Acute sinusitis, unspecified: Secondary | ICD-10-CM | POA: Diagnosis not present

## 2018-05-09 MED ORDER — AZITHROMYCIN 250 MG PO TABS: ORAL_TABLET | ORAL | 0 refills | Status: DC

## 2018-05-09 MED FILL — AZITHROMYCIN 250 MG TABLET: 250 | 5 days supply | Qty: 6 | Fill #0

## 2018-05-09 NOTE — Addendum Note (Signed)
Addended by: Waldon Merl on: 05/09/2018 10:27 AM   Modules accepted: Orders

## 2018-05-09 NOTE — Progress Notes (Addendum)
We are sorry that you are not feeling well.  Here is how we plan to help!  Based on what you have shared with me it looks like you have sinusitis.  Sinusitis is inflammation and infection in the sinus cavities of the head.  Based on your presentation I believe you most likely have Acute Bacterial Sinusitis.  This is an infection caused by bacteria and is treated with antibiotics. I have prescribed Azithromycin 250 mg, Take 2 pills day 1, and one pill daily days 2-5. You have clarithromycin listed as a drug allergy. Azithromycin is form the same class and may cause same allergic response. However, after review of your chart, you have been prescribed Azithromycin recently. I have tried to reach out to you to confirm this. I have also prescribed Flonase, use 2 sprays in each nostril once daily.  You may use an oral decongestant such as Mucinex D or if you have glaucoma or high blood pressure use plain Mucinex. Saline nasal spray help and can safely be used as often as needed for congestion.  If you develop worsening sinus pain, fever or notice severe headache and vision changes, or if symptoms are not better after completion of antibiotic, please schedule an appointment with a health care provider.    Sinus infections are not as easily transmitted as other respiratory infection, however we still recommend that you avoid close contact with loved ones, especially the very young and elderly.  Remember to wash your hands thoroughly throughout the day as this is the number one way to prevent the spread of infection!  Home Care:  Only take medications as instructed by your medical team.  Complete the entire course of an antibiotic.  Do not take these medications with alcohol.  A steam or ultrasonic humidifier can help congestion.  You can place a towel over your head and breathe in the steam from hot water coming from a faucet.  Avoid close contacts especially the very young and the elderly.  Cover your  mouth when you cough or sneeze.  Always remember to wash your hands.  Get Help Right Away If:  You develop worsening fever or sinus pain.  You develop a severe head ache or visual changes.  Your symptoms persist after you have completed your treatment plan.  Make sure you  Understand these instructions.  Will watch your condition.  Will get help right away if you are not doing well or get worse.  Your e-visit answers were reviewed by a board certified advanced clinical practitioner to complete your personal care plan.  Depending on the condition, your plan could have included both over the counter or prescription medications.  If there is a problem please reply  once you have received a response from your provider.  Your safety is important to [place].  If you have drug allergies check your prescription carefully.    You can use MyChart to ask questions about today's visit, request a non-urgent call back, or ask for a work or school excuse for 24 hours related to this e-Visit. If it has been greater than 24 hours you will need to follow up with your provider, or enter a new e-Visit to address those concerns.  You will get an e-mail in the next two days asking about your experience.  I hope that your e-visit has been valuable and will speed your recovery. Thank you for using e-visits.  I have spent 7 minutes in review of this chart- Lacy Duverney [facility]

## 2018-05-10 MED FILL — busPIRone HCL 10 MG TABS: 10 | 30 days supply | Qty: 90 | Fill #1

## 2018-05-11 DIAGNOSIS — F250 Schizoaffective disorder, bipolar type: Secondary | ICD-10-CM | POA: Diagnosis not present

## 2018-05-11 DIAGNOSIS — E669 Obesity, unspecified: Secondary | ICD-10-CM | POA: Diagnosis not present

## 2018-05-11 DIAGNOSIS — E221 Hyperprolactinemia: Secondary | ICD-10-CM | POA: Diagnosis not present

## 2018-05-11 DIAGNOSIS — Z789 Other specified health status: Secondary | ICD-10-CM | POA: Diagnosis not present

## 2018-05-11 DIAGNOSIS — N914 Secondary oligomenorrhea: Secondary | ICD-10-CM | POA: Diagnosis not present

## 2018-05-11 DIAGNOSIS — Z833 Family history of diabetes mellitus: Secondary | ICD-10-CM | POA: Diagnosis not present

## 2018-05-11 DIAGNOSIS — R631 Polydipsia: Secondary | ICD-10-CM | POA: Diagnosis not present

## 2018-05-11 DIAGNOSIS — H538 Other visual disturbances: Secondary | ICD-10-CM | POA: Diagnosis not present

## 2018-05-13 MED FILL — PALIPERIDONE ER 6 MG TABLET: 6 | 30 days supply | Qty: 30 | Fill #1

## 2018-05-13 MED FILL — REXULTI 0.25 MG TAB: 0.25 | 30 days supply | Qty: 30 | Fill #1

## 2018-05-16 MED FILL — CITALOPRAM HBR 10 MG TABLET: 10 | 30 days supply | Qty: 30 | Fill #0

## 2018-05-19 MED FILL — TRI-LO-MARZIA TABLET: 0.18/0.215/ | 84 days supply | Qty: 84 | Fill #0

## 2018-05-22 ENCOUNTER — Telehealth: Payer: 59 | Admitting: Physician Assistant

## 2018-05-22 DIAGNOSIS — J329 Chronic sinusitis, unspecified: Secondary | ICD-10-CM

## 2018-05-22 NOTE — Progress Notes (Signed)
Based on what you shared with me it looks like you have a condition that should be evaluated in a face to face office visit. Giving ongoing symptoms despite anabiotic therapy, you need to be seen in person for a good physical examination so that the most appropriate treatment can be given.  NOTE: If you entered your credit card information for this eVisit, you will not be charged. You may see a "hold" on your card for the $30 but that hold will drop off and you will not have a charge processed.  If you are having a true medical emergency please call 911.  If you need an urgent face to face visit, [place] has four urgent care centers for your convenience.  If you need care fast and have a high deductible or no insurance consider:   [email or web address] to reserve your spot online an avoid wait times  [facility] [address], [address] [place], [place] [address] 8 am to 8 pm Monday-Friday 10 am to 4 pm Saturday-Sunday *Across the street from [employer]  [place], [address] 8 am to 5 pm Monday-Friday * In the [facility] on the [facility]   The following sites will take your  insurance:  . [facility] Health Urgent [place] a Provider at this Location  [address] [place], [place] [address] . 10 am to 8 pm Monday-Friday . 12 pm to 8 pm Saturday-Sunday   . [facility] Health Urgent Care at [place] a Provider at this Location  [place], [place], [place] [address] . 8 am to 8 pm Monday-Friday . 9 am to 6 pm Saturday . 11 am to 6 pm Sunday   . [facility] Health Urgent Care at [place] Get Driving Directions  [address] [place], [place] [address] . 8 am to 8 pm Monday-Friday . 8 am to 4 pm Saturday-Sunday   Your e-visit answers were reviewed by a board  certified advanced clinical practitioner to complete your personal care plan.  Thank you for using e-Visits.

## 2018-05-23 ENCOUNTER — Other Ambulatory Visit: Payer: Self-pay | Admitting: *Deleted

## 2018-05-23 ENCOUNTER — Ambulatory Visit (INDEPENDENT_AMBULATORY_CARE_PROVIDER_SITE_OTHER): Payer: 59 | Admitting: Internal Medicine

## 2018-05-23 VITALS — BP 113/75 | HR 88 | Wt 222.2 lb

## 2018-05-23 DIAGNOSIS — J3489 Other specified disorders of nose and nasal sinuses: Secondary | ICD-10-CM

## 2018-05-23 MED ORDER — AZITHROMYCIN 250 MG PO TABS: ORAL_TABLET | ORAL | 0 refills | Status: DC

## 2018-05-23 MED FILL — AZITHROMYCIN 250 MG TABLET: 250 | 5 days supply | Qty: 6 | Fill #0

## 2018-05-23 NOTE — Patient Instructions (Signed)
Medication Instructions:  Your physician has recommended you make the following change in your medication:  1.) ZPak - take 2 tablets by mouth daily for one day, then take 1 tablet by mouth daily for 4 days  If you need a refill on your cardiac medications before your next appointment, please call your pharmacy.   Lab work: none If you have labs (blood work) drawn today and your tests are completely normal, you will receive your results only by: Marland Kitchen MyChart Message (if you have MyChart) OR . A paper copy in the mail If you have any lab test that is abnormal or we need to change your treatment, we will call you to review the results.  Testing/Procedures: none  Follow-Up: At [facility], you and your health needs are our priority.  As part of our continuing mission to provide you with exceptional heart care, we have created designated Provider Care Teams.  These Care Teams include your primary Cardiologist (physician) and Advanced Practice Providers (APPs -  Physician Assistants and Nurse Practitioners) who all work together to provide you with the care you need, when you need it. You will need a follow up appointment in:  12 months.  Please call our office 2 months in advance to schedule this appointment.  You may see Dr. Harrington Challenger or one of the following Advanced Practice Providers on your designated Care Team: Richardson Dopp, PA-C [place], [place] . Daune Perch, NP  Any Other Special Instructions Will Be Listed Below (If Applicable).

## 2018-05-27 DIAGNOSIS — G2401 Drug induced subacute dyskinesia: Secondary | ICD-10-CM | POA: Diagnosis not present

## 2018-05-27 DIAGNOSIS — F250 Schizoaffective disorder, bipolar type: Secondary | ICD-10-CM | POA: Diagnosis not present

## 2018-05-27 DIAGNOSIS — F429 Obsessive-compulsive disorder, unspecified: Secondary | ICD-10-CM | POA: Diagnosis not present

## 2018-05-27 DIAGNOSIS — F410 Panic disorder [episodic paroxysmal anxiety] without agoraphobia: Secondary | ICD-10-CM | POA: Diagnosis not present

## 2018-05-27 MED FILL — PALIPERIDONE ER 9 MG TB24: 9 | 30 days supply | Qty: 30 | Fill #0

## 2018-05-27 MED FILL — clonazePAM 0.5 MG TABS: 0.5 | 30 days supply | Qty: 15 | Fill #0

## 2018-05-30 NOTE — Progress Notes (Signed)
Cardiology Office Note   Date:  05/30/2018   ID:  Allison Cunningham, DOB [birth year], MRN [id number]  PCP:  Ma Rings, MD  Cardiologist:   Dorris Carnes, MD   Presents for eval ofsinus drainage, pressure  History of Present Illness: Allison Cunningham is a 38 y.o. female who presents for eval of sinus congestion.  Pt with progressive sinus pressure, HA  Nasal discharge is yellow/green.   Post nasal drip noted    Notes some feverish feelings  Has not taken temp No wheezes   No CP       Current Meds  Medication Sig  . acetaminophen (TYLENOL) 325 MG tablet Take 650 mg by mouth every 6 (six) hours as needed. Patient used this medication for pain.  Marland Kitchen albuterol (PROVENTIL HFA;VENTOLIN HFA) 108 (90 BASE) MCG/ACT inhaler Inhale 2 puffs into the lungs every 6 (six) hours as needed for wheezing or shortness of breath.  . baclofen (LIORESAL) 10 MG tablet Take 1 tablet (10 mg total) by mouth 3 (three) times daily.  . benzonatate (TESSALON PERLES) 100 MG capsule Take 1 capsule (100 mg total) by mouth 3 (three) times daily as needed for cough.  . Biotin 5000 MCG TABS Take 1 tablet by mouth daily.  . Brexpiprazole 0.25 MG TABS Take by mouth.  . busPIRone (BUSPAR) 10 MG tablet Take 10 mg by mouth 2 (two) times daily.  . cetirizine (ZYRTEC) 10 MG tablet Take 10 mg by mouth daily.  . citalopram (CELEXA) 10 MG tablet Take 10 mg by mouth daily.  . clonazePAM (KLONOPIN) 0.5 MG tablet Take 0.5 mg by mouth 2 (two) times daily.  . fluticasone (FLONASE) 50 MCG/ACT nasal spray Place 2 sprays into both nostrils daily.  . paliperidone (INVEGA) 3 MG 24 hr tablet Take by mouth.  [place] Tosylate 80 MG CAPS Take by mouth.     Allergies:   Bactrim [sulfamethoxazole-trimethoprim]; Doxycycline; Ibuprofen; Cephalexin; Clarithromycin; Aspirin; Clindamycin/lincomycin; Amoxicillin-pot clavulanate; and Penicillins   Past Medical History:  Diagnosis Date  . Anxiety   . Dizziness   . Palpitations   .  Pre-syncope   . Syncope and collapse    has no happened in last year--last 2013    Past Surgical History:  Procedure Laterality Date  . CESAREAN SECTION     x2     Social History:  The patient  reports that she has been smoking cigarettes. She has a 17.00 pack-year smoking history. She has never used smokeless tobacco. She reports that she does not drink alcohol or use drugs.   Family History:  The patient's family history includes Cervical cancer in her maternal grandmother; Cirrhosis in her father; Heart attack in her maternal grandmother and mother; Heart disease in her father; Heart murmur in her mother; Hypertension in her father, maternal grandmother, maternal uncle, and mother; Stroke in her maternal grandfather and maternal grandmother.    ROS:  Please see the history of present illness. All other systems are reviewed and  Negative to the above problem except as noted.    PHYSICAL EXAM: VS:  BP 113/75 (BP Location: Right Arm, Patient Position: Sitting, Cuff Size: Normal)   Pulse 88   Wt 222 lb 3.2 oz (100.8 kg)   BMI 34.80 kg/m   GEN: Well nourished, well developed, in no acute distress  HEENT: Tender to palpation L maxillary and frontal region   Neck: no JVD,  No adenopathy Cardiac: RRR; no murmurs, rubs, or gallops,no edema  Respiratory:  clear  to auscultation bilaterally, normal work of breathing GI: soft, nontender,  MS: no deformity Moving all extremities   Skin: warm and dry, no rash Neuro:  Strength and sensation are intact Psych: euthymic mood, full affect   EKG:  EKG is not ordered today.   Lipid Panel    Component Value Date/Time   CHOL 174 06/14/2015 0815   CHOL 174 06/27/2013 0913   TRIG 353 (H) 06/14/2015 0815   TRIG 217 (H) 06/27/2013 0913   HDL 25 (L) 06/14/2015 0815   HDL 41 06/27/2013 0913   CHOLHDL 7.0 (H) 06/14/2015 0815   VLDL 71 (H) 06/14/2015 0815   LDLCALC 78 06/14/2015 0815   LDLCALC 90 06/27/2013 0913   LDLDIRECT 120.3 04/11/2013  0943      Wt Readings from Last 3 Encounters:  05/23/18 222 lb 3.2 oz (100.8 kg)  09/20/17 239 lb 6.4 oz (108.6 kg)  01/23/15 215 lb (97.5 kg)      ASSESSMENT AND PLAN:  1  Sinus pressure.  Symptoms consistent with acute sinsusitis   Will give Rx for Zpac   She has taken before   Encouraged her to stay hydrated.   Saline washes Mil[facility]) Decongestant x 3 days only intranasally Nasal steroid BId  Return if symptoms do not resolve atfer finishing    Current medicines are reviewed at length with the patient today.  The patient does not have concerns regarding medicines.  Signed, Dorris Carnes, MD  05/30/2018 10:54 PM    Allison Cunningham, [place], [place]  [address] Phone: [phone]; Fax: [phone]

## 2018-06-01 ENCOUNTER — Telehealth: Payer: 59 | Admitting: Family

## 2018-06-01 DIAGNOSIS — N898 Other specified noninflammatory disorders of vagina: Secondary | ICD-10-CM | POA: Diagnosis not present

## 2018-06-01 DIAGNOSIS — R1084 Generalized abdominal pain: Secondary | ICD-10-CM

## 2018-06-01 NOTE — Progress Notes (Signed)
Based on what you shared with me it looks like you have a serious condition that should be evaluated in a face to face office visit.  NOTE: If you entered your credit card information for this eVisit, you will not be charged. You may see a "hold" on your card for the $30 but that hold will drop off and you will not have a charge processed.   Given you are having vaginal discharge and abdominal pain, I feel it would be best if you were evaluated face to face. Usually with yeast infections you do not have stomach pains and frequent  urination.    Approximately 5 mins was spent reviewing and documenting in patient's chart.   If you are having a true medical emergency please call 911.  If you need an urgent face to face visit, [place] has four urgent care centers for your convenience.  If you need care fast and have a high deductible or no insurance consider:   [email or web address] to reserve your spot online an avoid wait times  [facility] [address] [place], [place] [address] 8 am to 8 pm Monday-Friday 10 am to 4 pm Saturday-Sunday *Across the street from [employer]  [place], [address] 8 am to 5 pm Monday-Friday * In the [facility] on the [facility]   The following sites will take your  insurance:  . [facility] Health Urgent [place] a Provider at this Location  [address] [place], [place] [address] . 10 am to 8 pm Monday-Friday . 12 pm to 8 pm Saturday-Sunday   . [facility] Health Urgent Care at [place] a Provider at this Location  [place], [place], [place] [address] . 8 am to 8 pm Monday-Friday . 9 am to 6 pm Saturday . 11 am to 6 pm Sunday   . [facility] Health Urgent Care at [place] Get Driving Directions  [address]  [place], [place] [address] . 8 am to 8 pm Monday-Friday . 8 am to 4 pm Saturday-Sunday   Your e-visit answers were reviewed by a board certified advanced clinical practitioner to complete your personal care plan.  Thank you for using e-Visits.

## 2018-06-20 MED FILL — busPIRone HCL 10 MG TABS: 10 | 30 days supply | Qty: 90 | Fill #0

## 2018-06-20 MED FILL — REXULTI 0.25 MG TAB: 0.25 | 30 days supply | Qty: 30 | Fill #0

## 2018-06-20 MED FILL — CITALOPRAM HBR 10 MG TABLET: 10 | 30 days supply | Qty: 30 | Fill #0

## 2018-06-20 MED FILL — PALIPERIDONE ER 9 MG TB24: 9 | 30 days supply | Qty: 30 | Fill #1

## 2018-06-27 MED FILL — clonazePAM 0.5 MG TABS: 0.5 | 30 days supply | Qty: 15 | Fill #1

## 2018-07-21 MED FILL — clonazePAM 0.5 MG TABS: 0.5 | 30 days supply | Qty: 15 | Fill #0

## 2018-07-21 MED FILL — CITALOPRAM HBR 10 MG TABLET: 10 | 30 days supply | Qty: 30 | Fill #1

## 2018-07-21 MED FILL — busPIRone HCL 10 MG TABS: 10 | 30 days supply | Qty: 90 | Fill #1

## 2018-07-21 MED FILL — REXULTI 0.25 MG TAB: 0.25 | 30 days supply | Qty: 30 | Fill #1

## 2018-08-02 MED FILL — PALIPERIDONE ER 9 MG TB24: 9 | 30 days supply | Qty: 30 | Fill #0

## 2018-08-03 DIAGNOSIS — G2401 Drug induced subacute dyskinesia: Secondary | ICD-10-CM | POA: Diagnosis not present

## 2018-08-03 DIAGNOSIS — F410 Panic disorder [episodic paroxysmal anxiety] without agoraphobia: Secondary | ICD-10-CM | POA: Diagnosis not present

## 2018-08-03 DIAGNOSIS — F429 Obsessive-compulsive disorder, unspecified: Secondary | ICD-10-CM | POA: Diagnosis not present

## 2018-08-03 DIAGNOSIS — F250 Schizoaffective disorder, bipolar type: Secondary | ICD-10-CM | POA: Diagnosis not present

## 2018-08-03 MED FILL — CITALOPRAM HBR 20 MG TABLET: 20 | 30 days supply | Qty: 30 | Fill #0

## 2018-08-03 MED FILL — hydrOXYzine HCL 50 MG TABS: 50 | 30 days supply | Qty: 30 | Fill #0

## 2018-08-19 MED FILL — REXULTI 0.25 MG TAB: 0.25 | 30 days supply | Qty: 30 | Fill #0

## 2018-08-19 MED FILL — clonazePAM 0.5 MG TABS: 0.5 | 30 days supply | Qty: 15 | Fill #1

## 2018-08-19 MED FILL — busPIRone HCL 10 MG TABS: 10 | 30 days supply | Qty: 90 | Fill #0

## 2018-08-19 MED FILL — TRI-LO-MILI 0.18/0.215/0.25: 0.18/0.215/ | 84 days supply | Qty: 84 | Fill #1

## 2018-08-30 MED FILL — PALIPERIDONE ER 9 MG TB24: 9 | 30 days supply | Qty: 30 | Fill #1

## 2018-09-06 ENCOUNTER — Telehealth: Payer: 59 | Admitting: Physician Assistant

## 2018-09-06 DIAGNOSIS — M549 Dorsalgia, unspecified: Secondary | ICD-10-CM | POA: Diagnosis not present

## 2018-09-06 MED ORDER — NAPROXEN 500 MG PO TABS: 500.0000 mg | ORAL_TABLET | Freq: Two times a day (BID) | ORAL | 0 refills | Status: DC

## 2018-09-06 MED ORDER — CYCLOBENZAPRINE HCL 10 MG PO TABS: 5.0000 mg | ORAL_TABLET | Freq: Three times a day (TID) | ORAL | 0 refills | Status: DC | PRN

## 2018-09-06 MED ORDER — FAMOTIDINE 20 MG PO TABS: ORAL_TABLET | ORAL | 1 refills | Status: DC

## 2018-09-06 MED FILL — CYCLOBENZAPRINE HCL 10 MG T: 10 | 10 days supply | Qty: 30 | Fill #0

## 2018-09-06 MED FILL — NAPROXEN 500 MG TABLET: 500 | 15 days supply | Qty: 30 | Fill #0

## 2018-09-06 NOTE — Progress Notes (Signed)
We are sorry that you are not feeling well.  Here is how we plan to help!  Based on what you have shared with me it looks like you mostly have acute back pain.  Acute back pain is defined as musculoskeletal pain that can resolve in 1-3 weeks with conservative treatment.  I have prescribed Naprosyn 500 mg twice a day non-steroid anti-inflammatory (NSAID) as well as Flexeril 10 mg every eight hours as needed which is a muscle relaxer.  Because you reports GI upset with NSAIDS I have prescribed pepcid 20 mg to be taken twice daily with Naprosyn.  Please keep in mind that muscle relaxer's can cause fatigue and should not be taken while at work or driving.  Back pain is very common.  The pain often gets better over time.  The cause of back pain is usually not dangerous.  Most people can learn to manage their back pain on their own.  Home Care Stay active.  Start with short walks on flat ground if you can.  Try to walk farther each day. Do not sit, drive or stand in one place for more than 30 minutes.  Do not stay in bed. Do not avoid exercise or work.  Activity can help your back heal faster. Be careful when you bend or lift an object.  Bend at your knees, keep the object close to you, and do not twist. Sleep on a firm mattress.  Lie on your side, and bend your knees.  If you lie on your back, put a pillow under your knees. Only take medicines as told by your doctor. Put ice on the injured area. Put ice in a plastic bag Place a towel between your skin and the bag Leave the ice on for 15-20 minutes, 3-4 times a day for the first 2-3 days. 210 After that, you can switch between ice and heat packs. Ask your doctor about back exercises or massage. Avoid feeling anxious or stressed.  Find good ways to deal with stress, such as exercise.  Get Help Right Way If: Your pain does not go away with rest or medicine. Your pain does not go away in 1 week. You have new problems. You do not feel well. The pain  spreads into your legs. You cannot control when you poop (bowel movement) or pee (urinate) You feel sick to your stomach (nauseous) or throw up (vomit) You have belly (abdominal) pain. You feel like you may pass out (faint). If you develop a fever.  Make Sure you: Understand these instructions. Will watch your condition Will get help right away if you are not doing well or get worse.  Your e-visit answers were reviewed by a board certified advanced clinical practitioner to complete your personal care plan.  Depending on the condition, your plan could have included both over the counter or prescription medications.  If there is a problem please reply once you have received a response from your provider.  Your safety is important to [place].  If you have drug allergies check your prescription carefully.    You can use MyChart to ask questions about today's visit, request a non-urgent call back, or ask for a work or school excuse for 24 hours related to this e-Visit. If it has been greater than 24 hours you will need to follow up with your provider, or enter a new e-Visit to address those concerns.  You will get an e-mail in the next two days asking about your experience.  I hope that your e-visit has been valuable and will speed your recovery. Thank you for using e-visits.   ===View-only below this line===   ----- Message -----    From: Alayha A Montanaro    Sent: 09/06/2018  8:36 AM EDT      To: E-Visit Mailing List Subject: E-Visit Submission: Back Pain  E-Visit Submission: Back Pain --------------------------------  Question: Where are you having pain Answer:   Lower back  Question: Does the pain extend into your legs? Answer:   No  Question: Are you having any numbness or weakness of the legs? Answer:   No  Question: Does this pain radiate to the abdomen or groin? Answer:   No  Question: How bad is the pain? Answer:   The pain is moderate  Question: Did you have an injury  that caused the pain? Answer:   No, I cannot remember an injury  Question: How long has the pain been present? Answer:   More than 1 week but less then 4 weeks  Question: Have you had back pain in the past? Answer:   I have had back pain before, but this is markedly different  Question: Do you have a history of fever associated with your back pain? Answer:   No  Question: Please list any medications you have previously taken for back pain. Answer:   Muscle relaxers, Tylenol, Ibuprofen  Question: Do you have a fever? Answer:   No, I do not have a fever  Question: Do you have any of the following? Answer:   None of the above  Question: What makes the pain worse? Answer:   Any movement            Bending over            Sitting down            Strenuous activity  Question: What makes the pain better Answer:   Nothing makes it better  Question: Do you get relief with certain positions (even if only partial relief)? Answer:   Yes  Question: Have you ever been diagnosed with cancer? Answer:   No  Question: Have you ever been diagnosed with arthritis? Answer:   No  Question: Have you ever been diagnosed with osteoporosis or any other bone weakness? Answer:   No  Question: Have you ever had surgery on your back or spine? Answer:   No  Question: Do you have a history of Intravenous Drug Use? Answer:   No  Question: What is your usual health status? Answer:   I am active and can move normally  Question: Are you pregnant? Answer:   I am confident that I am not pregnant  Question: Are you breastfeeding? Answer:   No  Question: Please list your medication allergies that you may have ? (If 'none' , please list as 'none') Answer:   Some antibiotics  Question: Please list any additional comments  Answer:   We were painting our living room, dining room, and hallway last weekend and the pain started right after the next day after painting. I am not sure that is considered  an injury or not. During painting there was a lot of bending and movements like twisting I normally don't do.  A total of 5-10 minutes was spent evaluating this patients questionnaire and formulating a plan of care.

## 2018-09-13 MED FILL — CITALOPRAM HBR 20 MG TABLET: 20 | 30 days supply | Qty: 30 | Fill #1

## 2018-09-19 MED FILL — clonazePAM 0.5 MG TABS: 0.5 | 30 days supply | Qty: 15 | Fill #0

## 2018-09-19 MED FILL — REXULTI 0.25 MG TAB: 0.25 | 30 days supply | Qty: 30 | Fill #1

## 2018-09-30 DIAGNOSIS — F17210 Nicotine dependence, cigarettes, uncomplicated: Secondary | ICD-10-CM | POA: Diagnosis not present

## 2018-09-30 DIAGNOSIS — F250 Schizoaffective disorder, bipolar type: Secondary | ICD-10-CM | POA: Diagnosis not present

## 2018-09-30 DIAGNOSIS — E7849 Other hyperlipidemia: Secondary | ICD-10-CM | POA: Diagnosis not present

## 2018-09-30 DIAGNOSIS — E669 Obesity, unspecified: Secondary | ICD-10-CM | POA: Diagnosis not present

## 2018-09-30 DIAGNOSIS — R000 Tachycardia, unspecified: Secondary | ICD-10-CM | POA: Diagnosis not present

## 2018-09-30 DIAGNOSIS — F429 Obsessive-compulsive disorder, unspecified: Secondary | ICD-10-CM | POA: Diagnosis not present

## 2018-09-30 MED FILL — PALIPERIDONE ER 9MG TABLET: 9 | 30 days supply | Qty: 30 | Fill #0

## 2018-09-30 MED FILL — CARVEDILOL 3.125 MG TABLET: 3.125 | 90 days supply | Qty: 180 | Fill #0

## 2018-09-30 MED FILL — ATORVASTATIN 10 MG TABLET: 10 | 90 days supply | Qty: 90 | Fill #0

## 2018-10-10 MED FILL — CITALOPRAM HBR 20 MG TABLET: 20 | 30 days supply | Qty: 30 | Fill #0

## 2018-10-24 MED FILL — clonazePAM 0.5 MG TABS: 0.5 | 30 days supply | Qty: 15 | Fill #1

## 2018-10-24 MED FILL — REXULTI 0.25 MG TAB: 0.25 | 30 days supply | Qty: 30 | Fill #0

## 2018-10-24 MED FILL — busPIRone HCL 10 MG TABS: 10 | 30 days supply | Qty: 90 | Fill #1

## 2018-10-25 DIAGNOSIS — D443 Neoplasm of uncertain behavior of pituitary gland: Secondary | ICD-10-CM | POA: Diagnosis not present

## 2018-10-25 DIAGNOSIS — G43109 Migraine with aura, not intractable, without status migrainosus: Secondary | ICD-10-CM | POA: Diagnosis not present

## 2018-10-25 MED FILL — SUMATRIPTAN SUCC 50 MG TAB: 50 | 30 days supply | Qty: 18 | Fill #0

## 2018-10-25 MED FILL — PALIPERIDONE ER 9MG TABLET: 9 | 30 days supply | Qty: 30 | Fill #1

## 2018-11-08 DIAGNOSIS — F429 Obsessive-compulsive disorder, unspecified: Secondary | ICD-10-CM | POA: Diagnosis not present

## 2018-11-08 DIAGNOSIS — F410 Panic disorder [episodic paroxysmal anxiety] without agoraphobia: Secondary | ICD-10-CM | POA: Diagnosis not present

## 2018-11-08 DIAGNOSIS — F250 Schizoaffective disorder, bipolar type: Secondary | ICD-10-CM | POA: Diagnosis not present

## 2018-11-08 DIAGNOSIS — G2401 Drug induced subacute dyskinesia: Secondary | ICD-10-CM | POA: Diagnosis not present

## 2018-11-08 MED FILL — hydrOXYzine HCL 50 MG TABS: 50 | 30 days supply | Qty: 30 | Fill #0

## 2018-11-08 MED FILL — CITALOPRAM HBR 20 MG TABLET: 20 | 30 days supply | Qty: 30 | Fill #0

## 2018-11-14 MED FILL — MUPIROCIN 2% OINTMENT: 2 | 20 days supply | Qty: 22 | Fill #0

## 2018-11-17 MED FILL — TRI-LO-SPRINTEC TABLET: 0.18/0.215/ | 28 days supply | Qty: 28 | Fill #2

## 2018-11-22 MED FILL — clonazePAM 0.5 MG TABS: 0.5 | 30 days supply | Qty: 15 | Fill #0

## 2018-11-22 MED FILL — PALIPERIDONE ER 9MG TABLET: 9 | 30 days supply | Qty: 30 | Fill #0

## 2018-12-05 MED FILL — REXULTI 0.25 MG TAB: 0.25 | 30 days supply | Qty: 30 | Fill #0

## 2018-12-05 MED FILL — busPIRone HCL 10 MG TABS: 10 | 30 days supply | Qty: 90 | Fill #0

## 2018-12-07 MED FILL — CITALOPRAM HBR 20 MG TABLET: 20 | 30 days supply | Qty: 30 | Fill #1

## 2018-12-13 MED FILL — TRI-LO-SPRINTEC TABLET: 0.18/0.215/ | 84 days supply | Qty: 84 | Fill #0

## 2018-12-14 MED FILL — CARVEDILOL 3.125 MG TABLET: 3.125 | 90 days supply | Qty: 180 | Fill #1

## 2018-12-14 MED FILL — ATORVASTATIN 10 MG TABLET: 10 | 90 days supply | Qty: 90 | Fill #1

## 2018-12-26 MED FILL — PALIPERIDONE ER 9MG TABLET: 9 | 30 days supply | Qty: 30 | Fill #1

## 2018-12-27 MED FILL — clonazePAM 0.5 MG TABS: 0.5 | 30 days supply | Qty: 15 | Fill #1

## 2019-01-02 MED FILL — REXULTI 0.25 MG TAB: 0.25 | 30 days supply | Qty: 30 | Fill #1

## 2019-01-06 DIAGNOSIS — F429 Obsessive-compulsive disorder, unspecified: Secondary | ICD-10-CM | POA: Diagnosis not present

## 2019-01-06 DIAGNOSIS — F410 Panic disorder [episodic paroxysmal anxiety] without agoraphobia: Secondary | ICD-10-CM | POA: Diagnosis not present

## 2019-01-06 DIAGNOSIS — F250 Schizoaffective disorder, bipolar type: Secondary | ICD-10-CM | POA: Diagnosis not present

## 2019-01-06 DIAGNOSIS — G2401 Drug induced subacute dyskinesia: Secondary | ICD-10-CM | POA: Diagnosis not present

## 2019-01-06 MED FILL — hydrOXYzine HCL 25 MG TABS: 25 | 30 days supply | Qty: 30 | Fill #0

## 2019-01-06 MED FILL — busPIRone HCL 10 MG TABS: 10 | 30 days supply | Qty: 90 | Fill #0

## 2019-01-06 MED FILL — CITALOPRAM HBR 20 MG TABLET: 20 | 30 days supply | Qty: 30 | Fill #0

## 2019-01-16 MED FILL — busPIRone HCL 10 MG TABS: 10 | 30 days supply | Qty: 90 | Fill #0

## 2019-01-16 MED FILL — CITALOPRAM HBR 20 MG TABLET: 20 | 30 days supply | Qty: 30 | Fill #0

## 2019-01-27 DIAGNOSIS — Z0000 Encounter for general adult medical examination without abnormal findings: Secondary | ICD-10-CM | POA: Diagnosis not present

## 2019-01-27 DIAGNOSIS — E7849 Other hyperlipidemia: Secondary | ICD-10-CM | POA: Diagnosis not present

## 2019-01-27 DIAGNOSIS — E669 Obesity, unspecified: Secondary | ICD-10-CM | POA: Diagnosis not present

## 2019-01-27 DIAGNOSIS — G43109 Migraine with aura, not intractable, without status migrainosus: Secondary | ICD-10-CM | POA: Diagnosis not present

## 2019-01-27 DIAGNOSIS — E221 Hyperprolactinemia: Secondary | ICD-10-CM | POA: Diagnosis not present

## 2019-01-27 DIAGNOSIS — F250 Schizoaffective disorder, bipolar type: Secondary | ICD-10-CM | POA: Diagnosis not present

## 2019-01-27 DIAGNOSIS — F429 Obsessive-compulsive disorder, unspecified: Secondary | ICD-10-CM | POA: Diagnosis not present

## 2019-01-30 DIAGNOSIS — D443 Neoplasm of uncertain behavior of pituitary gland: Secondary | ICD-10-CM | POA: Diagnosis not present

## 2019-01-30 DIAGNOSIS — Z23 Encounter for immunization: Secondary | ICD-10-CM | POA: Diagnosis not present

## 2019-01-30 DIAGNOSIS — E7849 Other hyperlipidemia: Secondary | ICD-10-CM | POA: Diagnosis not present

## 2019-01-30 DIAGNOSIS — F17210 Nicotine dependence, cigarettes, uncomplicated: Secondary | ICD-10-CM | POA: Diagnosis not present

## 2019-01-30 DIAGNOSIS — G43109 Migraine with aura, not intractable, without status migrainosus: Secondary | ICD-10-CM | POA: Diagnosis not present

## 2019-01-30 DIAGNOSIS — Z0000 Encounter for general adult medical examination without abnormal findings: Secondary | ICD-10-CM | POA: Diagnosis not present

## 2019-01-30 DIAGNOSIS — E669 Obesity, unspecified: Secondary | ICD-10-CM | POA: Diagnosis not present

## 2019-01-30 DIAGNOSIS — R062 Wheezing: Secondary | ICD-10-CM | POA: Diagnosis not present

## 2019-01-30 DIAGNOSIS — F429 Obsessive-compulsive disorder, unspecified: Secondary | ICD-10-CM | POA: Diagnosis not present

## 2019-01-30 MED FILL — NICOTINE 21 MG/24HR PATCH: 21 | 28 days supply | Qty: 28 | Fill #0

## 2019-01-30 MED FILL — PROAIR RESPICLICK INHAL PWD: 108 (90 BAS | 25 days supply | Qty: 1 | Fill #0

## 2019-01-30 MED FILL — clonazePAM 0.5 MG TABS: 0.5 | 30 days supply | Qty: 15 | Fill #0

## 2019-01-30 MED FILL — REXULTI 0.25 MG TAB: 0.25 | 30 days supply | Qty: 30 | Fill #0

## 2019-01-30 MED FILL — PALIPERIDONE ER 9MG TABLET: 9 | 30 days supply | Qty: 30 | Fill #0

## 2019-02-08 MED FILL — REXULTI 0.25 MG TAB: 0.25 | 30 days supply | Qty: 30 | Fill #0

## 2019-02-21 MED FILL — CITALOPRAM HBR 20 MG TABLET: 20 | 30 days supply | Qty: 30 | Fill #1

## 2019-02-21 MED FILL — busPIRone HCL 10 MG TABS: 10 | 30 days supply | Qty: 90 | Fill #1

## 2019-03-01 MED FILL — clonazePAM 0.5 MG TABS: 0.5 | 30 days supply | Qty: 15 | Fill #1

## 2019-03-01 MED FILL — PALIPERIDONE ER 9MG TABLET: 9 | 30 days supply | Qty: 30 | Fill #1

## 2019-03-02 DIAGNOSIS — E221 Hyperprolactinemia: Secondary | ICD-10-CM | POA: Diagnosis not present

## 2019-03-06 DIAGNOSIS — M79672 Pain in left foot: Secondary | ICD-10-CM | POA: Diagnosis not present

## 2019-03-06 DIAGNOSIS — R296 Repeated falls: Secondary | ICD-10-CM | POA: Diagnosis not present

## 2019-03-06 DIAGNOSIS — M79662 Pain in left lower leg: Secondary | ICD-10-CM | POA: Diagnosis not present

## 2019-03-06 MED FILL — TRAMADOL-ACETAMINOPHN 37.5-: 37.5-325 | 8 days supply | Qty: 30 | Fill #0

## 2019-03-07 DIAGNOSIS — M79605 Pain in left leg: Secondary | ICD-10-CM | POA: Diagnosis not present

## 2019-03-07 DIAGNOSIS — S86112A Strain of other muscle(s) and tendon(s) of posterior muscle group at lower leg level, left leg, initial encounter: Secondary | ICD-10-CM | POA: Diagnosis not present

## 2019-03-13 MED FILL — TRI-LO-SPRINTEC TABLET: 0.18/0.215/ | 84 days supply | Qty: 84 | Fill #1

## 2019-03-16 DIAGNOSIS — F429 Obsessive-compulsive disorder, unspecified: Secondary | ICD-10-CM | POA: Diagnosis not present

## 2019-03-16 DIAGNOSIS — G2401 Drug induced subacute dyskinesia: Secondary | ICD-10-CM | POA: Diagnosis not present

## 2019-03-16 DIAGNOSIS — F410 Panic disorder [episodic paroxysmal anxiety] without agoraphobia: Secondary | ICD-10-CM | POA: Diagnosis not present

## 2019-03-16 DIAGNOSIS — F250 Schizoaffective disorder, bipolar type: Secondary | ICD-10-CM | POA: Diagnosis not present

## 2019-03-16 MED FILL — REXULTI 0.25 MG TAB: 0.25 | 30 days supply | Qty: 30 | Fill #0

## 2019-03-16 MED FILL — hydrOXYzine HCL 25 MG TABS: 25 | 30 days supply | Qty: 30 | Fill #0

## 2019-03-20 MED FILL — ATORVASTATIN 10 MG TABLET: 10 | 30 days supply | Qty: 30 | Fill #2

## 2019-03-20 MED FILL — CITALOPRAM HBR 20 MG TABLET: 20 | 30 days supply | Qty: 30 | Fill #0

## 2019-03-20 MED FILL — CARVEDILOL 3.125 MG TABLET: 3.125 | 90 days supply | Qty: 180 | Fill #0

## 2019-03-27 ENCOUNTER — Telehealth: Payer: 59 | Admitting: Physician Assistant

## 2019-03-27 DIAGNOSIS — M546 Pain in thoracic spine: Secondary | ICD-10-CM | POA: Diagnosis not present

## 2019-03-27 MED ORDER — BACLOFEN 10 MG PO TABS: 10.0000 mg | ORAL_TABLET | Freq: Three times a day (TID) | ORAL | 0 refills | Status: AC

## 2019-03-27 MED FILL — BACLOFEN 10 MG TABS: 10 | 5 days supply | Qty: 15 | Fill #0

## 2019-03-27 NOTE — Progress Notes (Signed)
We are sorry that you are not feeling well.  Here is how we plan to help!  Based on what you have shared with me it looks like you mostly have acute back pain.  Acute back pain is defined as musculoskeletal pain that can resolve in 1-3 weeks with conservative treatment.  I have prescribedBaclofen 10 mg every eight hours as needed which is a muscle relaxer  Some patients experience stomach irritation or in increased heartburn with anti-inflammatory drugs.  Please keep in mind that muscle relaxer's can cause fatigue and should not be taken while at work or driving.  Back pain is very common.  The pain often gets better over time.  The cause of back pain is usually not dangerous.  Most people can learn to manage their back pain on their own.  Home Care  Stay active.  Start with short walks on flat ground if you can.  Try to walk farther each day.  Do not sit, drive or stand in one place for more than 30 minutes.  Do not stay in bed.  Do not avoid exercise or work.  Activity can help your back heal faster.  Be careful when you bend or lift an object.  Bend at your knees, keep the object close to you, and do not twist.  Sleep on a firm mattress.  Lie on your side, and bend your knees.  If you lie on your back, put a pillow under your knees.  Only take medicines as told by your doctor.  Put ice on the injured area.  Put ice in a plastic bag  Place a towel between your skin and the bag  Leave the ice on for 15-20 minutes, 3-4 times a day for the first 2-3 days. 210 After that, you can switch between ice and heat packs.  Ask your doctor about back exercises or massage.  Avoid feeling anxious or stressed.  Find good ways to deal with stress, such as exercise.  Get Help Right Way If:  Your pain does not go away with rest or medicine.  Your pain does not go away in 1 week.  You have new problems.  You do not feel well.  The pain spreads into your legs.  You cannot control when you  poop (bowel movement) or pee (urinate)  You feel sick to your stomach (nauseous) or throw up (vomit)  You have belly (abdominal) pain.  You feel like you may pass out (faint).  If you develop a fever.  Make Sure you:  Understand these instructions.  Will watch your condition  Will get help right away if you are not doing well or get worse.  Your e-visit answers were reviewed by a board certified advanced clinical practitioner to complete your personal care plan.  Depending on the condition, your plan could have included both over the counter or prescription medications.  If there is a problem please reply  once you have received a response from your provider.  Your safety is important to [place].  If you have drug allergies check your prescription carefully.    You can use MyChart to ask questions about today's visit, request a non-urgent call back, or ask for a work or school excuse for 24 hours related to this e-Visit. If it has been greater than 24 hours you will need to follow up with your provider, or enter a new e-Visit to address those concerns.  You will get an e-mail in the next two days  asking about your experience.  I hope that your e-visit has been valuable and will speed your recovery. Thank you for using e-visits.  Greater than 5 minutes, yet less than 10 minutes of time have been spent researching, coordinating and implementing care for this patient today.

## 2019-04-03 MED FILL — clonazePAM 0.5 MG TABS: 0.5 | 30 days supply | Qty: 15 | Fill #0

## 2019-04-03 MED FILL — PALIPERIDONE ER 9 MG TB24: 9 | 30 days supply | Qty: 30 | Fill #0

## 2019-04-11 MED FILL — REXULTI 0.25 MG TAB: 0.25 | 30 days supply | Qty: 30 | Fill #1

## 2019-04-11 MED FILL — NICOTINE 21 MG/24HR PATCH: 21 | 28 days supply | Qty: 28 | Fill #1

## 2019-04-11 MED FILL — busPIRone HCL 10 MG TABS: 10 | 30 days supply | Qty: 90 | Fill #0

## 2019-04-11 MED FILL — SUMATRIPTAN SUCC 50 MG TAB: 50 | 30 days supply | Qty: 18 | Fill #1

## 2019-04-13 MED FILL — ATORVASTATIN 10 MG TABLET: 10 | 90 days supply | Qty: 90 | Fill #0

## 2019-04-13 MED FILL — CITALOPRAM HBR 20 MG TABLET: 20 | 30 days supply | Qty: 30 | Fill #1

## 2019-05-03 MED FILL — PALIPERIDONE ER 9 MG TB24: 9 | 30 days supply | Qty: 30 | Fill #1

## 2019-05-03 MED FILL — clonazePAM 0.5 MG TABS: 0.5 | 30 days supply | Qty: 15 | Fill #1

## 2019-05-08 MED FILL — REXULTI 0.25 MG TAB: 0.25 | 30 days supply | Qty: 30 | Fill #1

## 2019-05-12 DIAGNOSIS — F429 Obsessive-compulsive disorder, unspecified: Secondary | ICD-10-CM | POA: Diagnosis not present

## 2019-05-12 DIAGNOSIS — F250 Schizoaffective disorder, bipolar type: Secondary | ICD-10-CM | POA: Diagnosis not present

## 2019-05-12 DIAGNOSIS — G2401 Drug induced subacute dyskinesia: Secondary | ICD-10-CM | POA: Diagnosis not present

## 2019-05-12 DIAGNOSIS — F410 Panic disorder [episodic paroxysmal anxiety] without agoraphobia: Secondary | ICD-10-CM | POA: Diagnosis not present

## 2019-05-12 MED FILL — hydrOXYzine HCL 25 MG TABS: 25 | 30 days supply | Qty: 30 | Fill #0

## 2019-05-15 MED FILL — busPIRone HCL 10 MG TABS: 10 | 90 days supply | Qty: 90 | Fill #0

## 2019-05-15 MED FILL — CITALOPRAM HBR 20 MG TABLET: 20 | 30 days supply | Qty: 30 | Fill #0

## 2019-05-19 MED FILL — hydrOXYzine HCL 25 MG TABS: 25 | 30 days supply | Qty: 30 | Fill #0

## 2019-05-24 MED FILL — busPIRone HCL 10 MG TABS: 10 | 90 days supply | Qty: 90 | Fill #0

## 2019-05-24 MED FILL — CITALOPRAM HBR 20 MG TABLET: 20 | 30 days supply | Qty: 30 | Fill #0

## 2019-05-24 MED FILL — hydrOXYzine HCL 25 MG TABS: 25 | 30 days supply | Qty: 30 | Fill #0

## 2019-06-01 MED FILL — TRI-LO-SPRINTEC TABLET: 0.18/0.215/ | 28 days supply | Qty: 28 | Fill #2

## 2019-06-05 MED FILL — PALIPERIDONE ER 9 MG TB24: 9 | 30 days supply | Qty: 30 | Fill #0

## 2019-06-05 MED FILL — clonazePAM 0.5 MG TABS: 0.5 | 30 days supply | Qty: 15 | Fill #0

## 2019-06-15 DIAGNOSIS — G47411 Narcolepsy with cataplexy: Secondary | ICD-10-CM | POA: Diagnosis not present

## 2019-06-15 DIAGNOSIS — E7849 Other hyperlipidemia: Secondary | ICD-10-CM | POA: Diagnosis not present

## 2019-06-15 DIAGNOSIS — G4719 Other hypersomnia: Secondary | ICD-10-CM | POA: Diagnosis not present

## 2019-06-15 DIAGNOSIS — R000 Tachycardia, unspecified: Secondary | ICD-10-CM | POA: Diagnosis not present

## 2019-06-15 DIAGNOSIS — F429 Obsessive-compulsive disorder, unspecified: Secondary | ICD-10-CM | POA: Diagnosis not present

## 2019-06-15 DIAGNOSIS — G43109 Migraine with aura, not intractable, without status migrainosus: Secondary | ICD-10-CM | POA: Diagnosis not present

## 2019-06-15 DIAGNOSIS — D242 Benign neoplasm of left breast: Secondary | ICD-10-CM | POA: Diagnosis not present

## 2019-06-15 DIAGNOSIS — D443 Neoplasm of uncertain behavior of pituitary gland: Secondary | ICD-10-CM | POA: Diagnosis not present

## 2019-06-15 DIAGNOSIS — R442 Other hallucinations: Secondary | ICD-10-CM | POA: Diagnosis not present

## 2019-06-15 DIAGNOSIS — E669 Obesity, unspecified: Secondary | ICD-10-CM | POA: Diagnosis not present

## 2019-06-15 DIAGNOSIS — F17210 Nicotine dependence, cigarettes, uncomplicated: Secondary | ICD-10-CM | POA: Diagnosis not present

## 2019-06-15 DIAGNOSIS — F250 Schizoaffective disorder, bipolar type: Secondary | ICD-10-CM | POA: Diagnosis not present

## 2019-06-15 DIAGNOSIS — Z9181 History of falling: Secondary | ICD-10-CM | POA: Diagnosis not present

## 2019-06-16 ENCOUNTER — Telehealth: Payer: 59 | Admitting: Physician Assistant

## 2019-06-16 DIAGNOSIS — H1032 Unspecified acute conjunctivitis, left eye: Secondary | ICD-10-CM

## 2019-06-16 MED ORDER — LEVOFLOXACIN 0.5 % OP SOLN: OPHTHALMIC | 0 refills | Status: DC

## 2019-06-16 NOTE — Progress Notes (Signed)
E-Visit for [employer]   We are sorry that you are not feeling well.  Here is how we plan to help!  Based on what you have shared with me it looks like you have conjunctivitis.  Conjunctivitis is a common inflammatory or infectious condition of the eye that is often referred to as "pink eye".  In most cases it is contagious (viral or bacterial). However, not all conjunctivitis requires antibiotics (ex. Allergic).  We have made appropriate suggestions for you based upon your presentation.  I have prescribed Levofloxacin drops to use for the next 7 days. Do not wear your contacts while using this medication.  Pink eye can be highly contagious.  It is typically spread through direct contact with secretions, or contaminated objects or surfaces that one may have touched.  Strict handwashing is suggested with soap and water is urged.  If not available, use alcohol based had sanitizer.  Avoid unnecessary touching of the eye.  If you wear contact lenses, you will need to refrain from wearing them until you see no white discharge from the eye for at least 24 hours after being on medication.  You should see symptom improvement in 1-2 days after starting the medication regimen.  Call us if symptoms are not improved in 1-2 days.  Home Care:  Wash your hands often!  Do not wear your contacts until you complete your treatment plan.  Avoid sharing towels, bed linen, personal items with a person who has pink eye.  See attention for anyone in your home with similar symptoms.  Get Help Right Away If:  Your symptoms do not improve.  You develop blurred or loss of vision.  Your symptoms worsen (increased discharge, pain or redness)  Your e-visit answers were reviewed by a board certified advanced clinical practitioner to complete your personal care plan.  Depending on the condition, your plan could have included both over the counter or prescription medications.  If there is a problem please reply  once you  have received a response from your provider.  Your safety is important to [place].  If you have drug allergies check your prescription carefully.    You can use MyChart to ask questions about today's visit, request a non-urgent call back, or ask for a work or school excuse for 24 hours related to this e-Visit. If it has been greater than 24 hours you will need to follow up with your provider, or enter a new e-Visit to address those concerns.   You will get an e-mail in the next two days asking about your experience.  I hope that your e-visit has been valuable and will speed your recovery. Thank you for using e-visits.  Approximately 5 minutes was spent documenting and reviewing patient's chart.

## 2019-06-20 MED FILL — REXULTI 0.25 MG TAB: 0.25 | 30 days supply | Qty: 30 | Fill #0

## 2019-06-20 MED FILL — CARVEDILOL 3.125 MG TABLET: 3.125 | 90 days supply | Qty: 180 | Fill #1

## 2019-06-20 MED FILL — CITALOPRAM HBR 20 MG TABLET: 20 | 30 days supply | Qty: 30 | Fill #1

## 2019-06-22 DIAGNOSIS — F250 Schizoaffective disorder, bipolar type: Secondary | ICD-10-CM | POA: Diagnosis not present

## 2019-06-22 DIAGNOSIS — E7849 Other hyperlipidemia: Secondary | ICD-10-CM | POA: Diagnosis not present

## 2019-06-22 DIAGNOSIS — G2401 Drug induced subacute dyskinesia: Secondary | ICD-10-CM | POA: Diagnosis not present

## 2019-06-22 DIAGNOSIS — E669 Obesity, unspecified: Secondary | ICD-10-CM | POA: Diagnosis not present

## 2019-06-22 DIAGNOSIS — F429 Obsessive-compulsive disorder, unspecified: Secondary | ICD-10-CM | POA: Diagnosis not present

## 2019-06-22 DIAGNOSIS — F410 Panic disorder [episodic paroxysmal anxiety] without agoraphobia: Secondary | ICD-10-CM | POA: Diagnosis not present

## 2019-06-22 DIAGNOSIS — R5382 Chronic fatigue, unspecified: Secondary | ICD-10-CM | POA: Diagnosis not present

## 2019-06-22 MED FILL — MODAFINIL 200 MG TABS: 200 | 30 days supply | Qty: 30 | Fill #0

## 2019-06-22 MED FILL — busPIRone HCL 10 MG TABS: 10 | 30 days supply | Qty: 90 | Fill #0

## 2019-06-22 MED FILL — hydrOXYzine HCL 25 MG TABS: 25 | 30 days supply | Qty: 30 | Fill #0

## 2019-06-29 MED FILL — TRI-LO-SPRINTEC TABLET: 0.18/0.215/ | 84 days supply | Qty: 84 | Fill #0

## 2019-07-03 MED FILL — PALIPERIDONE ER 9 MG TB24: 9 | 30 days supply | Qty: 30 | Fill #1

## 2019-07-07 DIAGNOSIS — E669 Obesity, unspecified: Secondary | ICD-10-CM | POA: Diagnosis not present

## 2019-07-07 DIAGNOSIS — G47411 Narcolepsy with cataplexy: Secondary | ICD-10-CM | POA: Diagnosis not present

## 2019-07-07 DIAGNOSIS — G4440 Drug-induced headache, not elsewhere classified, not intractable: Secondary | ICD-10-CM | POA: Diagnosis not present

## 2019-07-07 DIAGNOSIS — F17200 Nicotine dependence, unspecified, uncomplicated: Secondary | ICD-10-CM | POA: Diagnosis not present

## 2019-07-07 DIAGNOSIS — D443 Neoplasm of uncertain behavior of pituitary gland: Secondary | ICD-10-CM | POA: Diagnosis not present

## 2019-07-07 DIAGNOSIS — G43009 Migraine without aura, not intractable, without status migrainosus: Secondary | ICD-10-CM | POA: Diagnosis not present

## 2019-07-07 DIAGNOSIS — F250 Schizoaffective disorder, bipolar type: Secondary | ICD-10-CM | POA: Diagnosis not present

## 2019-07-07 MED FILL — clonazePAM 0.5 MG TABS: 0.5 | 30 days supply | Qty: 15 | Fill #1

## 2019-07-07 MED FILL — TOPIRAMATE 25 MG TAB: 25 | 47 days supply | Qty: 120 | Fill #0

## 2019-07-07 MED FILL — SUMATRIPTAN SUCC 100 MG TAB: 100 | 30 days supply | Qty: 9 | Fill #0

## 2019-07-21 MED FILL — MODAFINIL 200 MG TABS: 200 | 30 days supply | Qty: 30 | Fill #0

## 2019-07-21 MED FILL — REXULTI 0.25 MG TAB: 0.25 | 30 days supply | Qty: 30 | Fill #0

## 2019-07-21 MED FILL — hydrOXYzine HCL 25 MG TABS: 25 | 30 days supply | Qty: 30 | Fill #1

## 2019-07-21 MED FILL — ATORVASTATIN 10 MG TABLET: 10 | 90 days supply | Qty: 90 | Fill #1

## 2019-07-21 MED FILL — busPIRone HCL 10 MG TABS: 10 | 30 days supply | Qty: 90 | Fill #1

## 2019-07-21 MED FILL — CITALOPRAM HBR 20 MG TABLET: 20 | 30 days supply | Qty: 30 | Fill #0

## 2019-08-02 MED FILL — PALIPERIDONE ER 9 MG TB24: 9 | 30 days supply | Qty: 30 | Fill #0

## 2019-08-07 MED FILL — clonazePAM 0.5 MG TABS: 0.5 | 30 days supply | Qty: 15 | Fill #0

## 2019-08-13 ENCOUNTER — Emergency Department (INDEPENDENT_AMBULATORY_CARE_PROVIDER_SITE_OTHER)
Admission: EM | Admit: 2019-08-13 | Discharge: 2019-08-13 | Disposition: A | Discharge status: Home / Self Care | Payer: 59 | Source: Home / Self Care

## 2019-08-13 ENCOUNTER — Emergency Department (INDEPENDENT_AMBULATORY_CARE_PROVIDER_SITE_OTHER): Payer: 59

## 2019-08-13 ENCOUNTER — Other Ambulatory Visit: Payer: Self-pay

## 2019-08-13 ENCOUNTER — Encounter: Payer: Self-pay | Admitting: Emergency Medicine

## 2019-08-13 DIAGNOSIS — M25511 Pain in right shoulder: Secondary | ICD-10-CM

## 2019-08-13 DIAGNOSIS — S4991XA Unspecified injury of right shoulder and upper arm, initial encounter: Secondary | ICD-10-CM | POA: Diagnosis not present

## 2019-08-13 DIAGNOSIS — W228XXA Striking against or struck by other objects, initial encounter: Secondary | ICD-10-CM

## 2019-08-13 MED ORDER — TRAMADOL HCL 50 MG PO TABS: 50.0000 mg | ORAL_TABLET | Freq: Four times a day (QID) | ORAL | 0 refills | Status: DC | PRN
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
## 2019-08-13 NOTE — Discharge Instructions (Signed)
  Tramadol is strong pain medication. While taking, do not drink alcohol, drive, or perform any other activities that requires focus while taking these medications.   You may take 500mg  acetaminophen every 4-6 hours or in combination with ibuprofen 400-600mg  every 6-8 hours as needed for pain and inflammation.  Please call to schedule a follow up appointment with Sports Medicine or an orthopedist for further evaluation and treatment of your shoulder pain and limited range of motion.
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
## 2019-08-14 ENCOUNTER — Ambulatory Visit (INDEPENDENT_AMBULATORY_CARE_PROVIDER_SITE_OTHER): Payer: 59 | Admitting: Sports Medicine

## 2019-08-14 DIAGNOSIS — S4991XA Unspecified injury of right shoulder and upper arm, initial encounter: Secondary | ICD-10-CM

## 2019-08-14 MED ORDER — MELOXICAM 15 MG PO TABS: ORAL_TABLET | ORAL | 3 refills | Status: DC

## 2019-08-14 MED ORDER — TRIAZOLAM 0.25 MG PO TABS: ORAL_TABLET | ORAL | 0 refills | Status: DC

## 2019-08-14 MED FILL — TOPIRAMATE 25 MG TAB: 25 | 47 days supply | Qty: 120 | Fill #1
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
## 2019-08-15 ENCOUNTER — Encounter: Payer: 59 | Admitting: Sports Medicine

## 2019-08-21 ENCOUNTER — Ambulatory Visit: Payer: 59 | Admitting: Sports Medicine

## 2019-08-26 ENCOUNTER — Other Ambulatory Visit: Payer: 59

## 2019-08-30 MED FILL — busPIRone HCL 10 MG TABS: 10 | 30 days supply | Qty: 90 | Fill #1

## 2019-08-30 MED FILL — PALIPERIDONE ER 9 MG TB24: 9 | 30 days supply | Qty: 30 | Fill #1

## 2019-09-05 ENCOUNTER — Telehealth: Payer: 59 | Admitting: Physician Assistant

## 2019-09-05 DIAGNOSIS — R21 Rash and other nonspecific skin eruption: Secondary | ICD-10-CM | POA: Diagnosis not present

## 2019-09-05 MED ORDER — PREDNISONE 10 MG PO TABS: 10.0000 mg | ORAL_TABLET | Freq: Every day | ORAL | 0 refills | Status: AC

## 2019-09-05 MED FILL — predniSONE 10 MG TABS: 10 | 5 days supply | Qty: 5 | Fill #0 | Status: TO

## 2019-09-05 MED FILL — predniSONE 10 MG TABS: 10 | 5 days supply | Qty: 5 | Fill #0

## 2019-09-05 NOTE — Progress Notes (Signed)
E Visit for Rash ° °We are sorry that you are not feeling well. Here is how we plan to help! ° °Based on what you shared with me it looks like you have contact dermatitis.  Contact dermatitis is a skin rash caused by something that touches the skin and causes irritation or inflammation.  Your skin may be red, swollen, dry, cracked, and itch.  The rash should go away in a few days but can last a few weeks.  If you get a rash, it's important to figure out what caused it so the irritant can be avoided in the future. and I have prescribed Prednisone 10 mg daily for 5 days ° °HOME CARE: ° °· Take cool showers and avoid direct sunlight. °· Apply cool compress or wet dressings. °· Take a bath in an oatmeal bath.  Sprinkle content of one Aveeno packet under running faucet with comfortably warm water.  Bathe for 15-20 minutes, 1-2 times daily.  Pat dry with a towel. Do not rub the rash. °· Use hydrocortisone cream. °· Take an antihistamine like Benadryl for widespread rashes that itch.  The adult dose of Benadryl is 25-50 mg by mouth 4 times daily. °· Caution:  This type of medication may cause sleepiness.  Do not drink alcohol, drive, or operate dangerous machinery while taking antihistamines.  Do not take these medications if you have prostate enlargement.  Read package instructions thoroughly on all medications that you take. ° °GET HELP RIGHT AWAY IF: ° °· Symptoms don't go away after treatment. °· Severe itching that persists. °· If you rash spreads or swells. °· If you rash begins to smell. °· If it blisters and opens or develops a yellow-brown crust. °· You develop a fever. °· You have a sore throat. °· You become short of breath. ° °MAKE SURE YOU: ° °Understand these instructions. °Will watch your condition. °Will get help right away if you are not doing well or get worse. ° °Thank you for choosing an e-visit. °Your e-visit answers were reviewed by a board certified advanced clinical practitioner to complete your  personal care plan. Depending upon the condition, your plan could have included both over the counter or prescription medications. °Please review your pharmacy choice. Be sure that the pharmacy you have chosen is open so that you can pick up your prescription now.  If there is a problem you may message your provider in MyChart to have the prescription routed to another pharmacy. °Your safety is important to us. If you have drug allergies check your prescription carefully.  °For the next 24 hours, you can use MyChart to ask questions about today’s visit, request a non-urgent call back, or ask for a work or school excuse from your e-visit provider. °You will get an email in the next two days asking about your experience. I hope that your e-visit has been valuable and will speed your recovery. ° ° ° ° °Greater than 5 minutes, yet less than 10 minutes of time have been spent researching, coordinating and implementing care for this patient today.  ° °

## 2019-09-06 MED FILL — clonazePAM 0.5 MG TABS: 0.5 | 30 days supply | Qty: 15 | Fill #1

## 2019-09-08 DIAGNOSIS — F250 Schizoaffective disorder, bipolar type: Secondary | ICD-10-CM | POA: Diagnosis not present

## 2019-09-08 DIAGNOSIS — G2401 Drug induced subacute dyskinesia: Secondary | ICD-10-CM | POA: Diagnosis not present

## 2019-09-08 DIAGNOSIS — F429 Obsessive-compulsive disorder, unspecified: Secondary | ICD-10-CM | POA: Diagnosis not present

## 2019-09-08 DIAGNOSIS — F422 Mixed obsessional thoughts and acts: Secondary | ICD-10-CM | POA: Diagnosis not present

## 2019-09-08 DIAGNOSIS — F410 Panic disorder [episodic paroxysmal anxiety] without agoraphobia: Secondary | ICD-10-CM | POA: Diagnosis not present

## 2019-09-08 MED FILL — hydrOXYzine HCL 25 MG TABS: 25 | 30 days supply | Qty: 30 | Fill #0

## 2019-09-25 ENCOUNTER — Encounter (HOSPITAL_COMMUNITY): Payer: Self-pay | Admitting: Psychiatry

## 2019-09-25 ENCOUNTER — Telehealth (INDEPENDENT_AMBULATORY_CARE_PROVIDER_SITE_OTHER): Payer: 59 | Admitting: Psychiatry

## 2019-09-25 DIAGNOSIS — F250 Schizoaffective disorder, bipolar type: Secondary | ICD-10-CM | POA: Diagnosis not present

## 2019-09-25 DIAGNOSIS — F5102 Adjustment insomnia: Secondary | ICD-10-CM | POA: Diagnosis not present

## 2019-09-25 DIAGNOSIS — F411 Generalized anxiety disorder: Secondary | ICD-10-CM

## 2019-09-25 DIAGNOSIS — F422 Mixed obsessional thoughts and acts: Secondary | ICD-10-CM | POA: Diagnosis not present
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
## 2019-09-28 ENCOUNTER — Telehealth: Payer: 59 | Admitting: Family

## 2019-09-28 DIAGNOSIS — R05 Cough: Secondary | ICD-10-CM

## 2019-09-28 DIAGNOSIS — R059 Cough, unspecified: Secondary | ICD-10-CM

## 2019-09-28 NOTE — Progress Notes (Signed)
We are sorry that you are not feeling well.  Here is how we plan to help!  Based on your presentation I believe you most likely have A cough due to a virus.  This is called viral bronchitis and is best treated by rest, plenty of fluids and control of the cough.  You may use Ibuprofen or Tylenol as directed to help your symptoms.     In addition you may use A non-prescription cough medication called Mucinex DM: take 2 tablets every 12 hours.  Prednisone 10 mg daily for 6 days (see taper instructions below)  Directions for 6 day taper: Day 1: 2 tablets before breakfast, 1 after both lunch & dinner and 2 at bedtime Day 2: 1 tab before breakfast, 1 after both lunch & dinner and 2 at bedtime Day 3: 1 tab at each meal & 1 at bedtime Day 4: 1 tab at breakfast, 1 at lunch, 1 at bedtime Day 5: 1 tab at breakfast & 1 tab at bedtime Day 6: 1 tab at breakfast   From your responses in the eVisit questionnaire you describe inflammation in the upper respiratory tract which is causing a significant cough.  This is commonly called Bronchitis and has four common causes:    Allergies  Viral Infections  Acid Reflux  Bacterial Infection Allergies, viruses and acid reflux are treated by controlling symptoms or eliminating the cause. An example might be a cough caused by taking certain blood pressure medications. You stop the cough by changing the medication. Another example might be a cough caused by acid reflux. Controlling the reflux helps control the cough.  USE OF BRONCHODILATOR ("RESCUE") INHALERS: There is a risk from using your bronchodilator too frequently.  The risk is that over-reliance on a medication which only relaxes the muscles surrounding the breathing tubes can reduce the effectiveness of medications prescribed to reduce swelling and congestion of the tubes themselves.  Although you feel brief relief from the bronchodilator inhaler, your asthma may actually be worsening with the tubes  becoming more swollen and filled with mucus.  This can delay other crucial treatments, such as oral steroid medications. If you need to use a bronchodilator inhaler daily, several times per day, you should discuss this with your provider.  There are probably better treatments that could be used to keep your asthma under control.     HOME CARE . Only take medications as instructed by your medical team. . Complete the entire course of an antibiotic. . Drink plenty of fluids and get plenty of rest. . Avoid close contacts especially the very young and the elderly . Cover your mouth if you cough or cough into your sleeve. . Always remember to wash your hands . A steam or ultrasonic humidifier can help congestion.   GET HELP RIGHT AWAY IF: . You develop worsening fever. . You become short of breath . You cough up blood. . Your symptoms persist after you have completed your treatment plan MAKE SURE YOU   Understand these instructions.  Will watch your condition.  Will get help right away if you are not doing well or get worse.  Your e-visit answers were reviewed by a board certified advanced clinical practitioner to complete your personal care plan.  Depending on the condition, your plan could have included both over the counter or prescription medications. If there is a problem please reply  once you have received a response from your provider. Your safety is important to [place].  If you  have drug allergies check your prescription carefully.    You can use MyChart to ask questions about today's visit, request a non-urgent call back, or ask for a work or school excuse for 24 hours related to this e-Visit. If it has been greater than 24 hours you will need to follow up with your provider, or enter a new e-Visit to address those concerns. You will get an e-mail in the next two days asking about your experience.  I hope that your e-visit has been valuable and will speed your recovery. Thank you for  using e-visits.   Greater than 5 minutes, yet less than 10 minutes of time have been spent researching, coordinating, and implementing care for this patient today.  Thank you for the details you included in the comment boxes. Those details are very helpful in determining the best course of treatment for you and help [place] to provide the best care.

## 2019-09-29 DIAGNOSIS — M25541 Pain in joints of right hand: Secondary | ICD-10-CM | POA: Diagnosis not present

## 2019-09-29 DIAGNOSIS — M25542 Pain in joints of left hand: Secondary | ICD-10-CM | POA: Diagnosis not present

## 2019-09-29 DIAGNOSIS — M2559 Pain in other specified joint: Secondary | ICD-10-CM | POA: Diagnosis not present

## 2019-09-29 MED FILL — predniSONE 10 MG TABS: 10 | 6 days supply | Qty: 21 | Fill #0

## 2019-10-03 DIAGNOSIS — A770 Spotted fever due to Rickettsia rickettsii: Secondary | ICD-10-CM | POA: Diagnosis not present

## 2019-10-03 DIAGNOSIS — D72829 Elevated white blood cell count, unspecified: Secondary | ICD-10-CM | POA: Diagnosis not present

## 2019-10-03 MED FILL — ONDANSETRON HCL 4 MG TABLET: 4 | 30 days supply | Qty: 30 | Fill #0

## 2019-10-03 MED FILL — DOXYCYCLINE HYCLATE 100 MG: 100 | 10 days supply | Qty: 20 | Fill #0

## 2019-10-05 MED FILL — ATORVASTATIN CALCIUM 10 MG: 10 | 90 days supply | Qty: 90 | Fill #0

## 2019-10-05 MED FILL — clonazePAM 0.5 MG TABS: 0.5 | 30 days supply | Qty: 15 | Fill #0

## 2019-10-05 MED FILL — busPIRone HCL 10 MG TABS: 10 | 30 days supply | Qty: 90 | Fill #0

## 2019-10-05 MED FILL — TOPIRAMATE 25 MG TAB: 25 | 47 days supply | Qty: 120 | Fill #2

## 2019-10-05 MED FILL — PALIPERIDONE ER 9 MG TB24: 9 | 30 days supply | Qty: 30 | Fill #0

## 2019-10-09 DIAGNOSIS — D72829 Elevated white blood cell count, unspecified: Secondary | ICD-10-CM | POA: Diagnosis not present

## 2019-10-09 DIAGNOSIS — A770 Spotted fever due to Rickettsia rickettsii: Secondary | ICD-10-CM | POA: Diagnosis not present

## 2019-10-09 MED FILL — DICLOFENAC SODIUM 75 MG TAB: 75 | 30 days supply | Qty: 60 | Fill #0

## 2019-10-09 MED FILL — TRIAMCINOLONE ACETONIDE 0.1: 0.1 | 30 days supply | Qty: 80 | Fill #0

## 2019-10-11 DIAGNOSIS — A770 Spotted fever due to Rickettsia rickettsii: Secondary | ICD-10-CM | POA: Diagnosis not present

## 2019-10-16 MED FILL — REXULTI 0.25 MG TAB: 0.25 | 30 days supply | Qty: 30 | Fill #1

## 2019-10-16 MED FILL — CITALOPRAM HBR 20 MG TABLET: 20 | 30 days supply | Qty: 30 | Fill #1

## 2019-10-19 DIAGNOSIS — M2550 Pain in unspecified joint: Secondary | ICD-10-CM | POA: Diagnosis not present

## 2019-10-19 DIAGNOSIS — F17210 Nicotine dependence, cigarettes, uncomplicated: Secondary | ICD-10-CM | POA: Diagnosis not present

## 2019-10-19 DIAGNOSIS — R21 Rash and other nonspecific skin eruption: Secondary | ICD-10-CM | POA: Diagnosis not present

## 2019-10-19 MED FILL — predniSONE 10 MG TABS: 10 | 6 days supply | Qty: 21 | Fill #0

## 2019-10-23 MED FILL — MODAFINIL 200 MG TABS: 200 | 30 days supply | Qty: 45 | Fill #0

## 2019-10-25 ENCOUNTER — Telehealth (HOSPITAL_COMMUNITY): Payer: 59 | Admitting: Psychiatry

## 2019-10-31 MED FILL — PALIPERIDONE ER 9 MG TB24: 9 | 30 days supply | Qty: 30 | Fill #1

## 2019-11-02 ENCOUNTER — Other Ambulatory Visit (HOSPITAL_BASED_OUTPATIENT_CLINIC_OR_DEPARTMENT_OTHER): Payer: Self-pay | Admitting: Family Medicine

## 2019-11-02 DIAGNOSIS — E669 Obesity, unspecified: Secondary | ICD-10-CM | POA: Diagnosis not present

## 2019-11-02 DIAGNOSIS — G47411 Narcolepsy with cataplexy: Secondary | ICD-10-CM | POA: Diagnosis not present

## 2019-11-02 DIAGNOSIS — F250 Schizoaffective disorder, bipolar type: Secondary | ICD-10-CM | POA: Diagnosis not present

## 2019-11-02 DIAGNOSIS — F17200 Nicotine dependence, unspecified, uncomplicated: Secondary | ICD-10-CM | POA: Diagnosis not present

## 2019-11-02 DIAGNOSIS — G43009 Migraine without aura, not intractable, without status migrainosus: Secondary | ICD-10-CM | POA: Diagnosis not present

## 2019-11-02 MED FILL — TOPIRAMATE 100 MG TABLET: 100 | 90 days supply | Qty: 90 | Fill #0

## 2019-11-02 MED FILL — CHLORZOXAZONE 500 MG TABLET: 500 | 15 days supply | Qty: 60 | Fill #0

## 2019-11-03 DIAGNOSIS — Z23 Encounter for immunization: Secondary | ICD-10-CM | POA: Diagnosis not present

## 2019-11-03 MED FILL — TRIAMCINOLONE ACETONIDE 0.1: 0.1 | 30 days supply | Qty: 80 | Fill #1

## 2019-11-03 MED FILL — busPIRone HCL 10 MG TABS: 10 | 30 days supply | Qty: 90 | Fill #1

## 2019-11-06 MED FILL — clonazePAM 0.5 MG TABS: 0.5 | 30 days supply | Qty: 15 | Fill #1

## 2019-11-08 DIAGNOSIS — F410 Panic disorder [episodic paroxysmal anxiety] without agoraphobia: Secondary | ICD-10-CM | POA: Diagnosis not present

## 2019-11-08 DIAGNOSIS — G2401 Drug induced subacute dyskinesia: Secondary | ICD-10-CM | POA: Diagnosis not present

## 2019-11-08 DIAGNOSIS — F429 Obsessive-compulsive disorder, unspecified: Secondary | ICD-10-CM | POA: Diagnosis not present

## 2019-11-08 DIAGNOSIS — F250 Schizoaffective disorder, bipolar type: Secondary | ICD-10-CM | POA: Diagnosis not present

## 2019-11-08 MED FILL — hydrOXYzine HCL 25 MG TABS: 25 | 30 days supply | Qty: 30 | Fill #0

## 2019-11-09 MED FILL — CITALOPRAM HBR 20 MG TABLET: 20 | 30 days supply | Qty: 30 | Fill #0

## 2019-11-14 DIAGNOSIS — M2550 Pain in unspecified joint: Secondary | ICD-10-CM | POA: Diagnosis not present

## 2019-11-14 DIAGNOSIS — G8929 Other chronic pain: Secondary | ICD-10-CM | POA: Diagnosis not present

## 2019-11-14 DIAGNOSIS — M545 Low back pain: Secondary | ICD-10-CM | POA: Diagnosis not present

## 2019-11-17 MED FILL — REXULTI 0.25 MG TAB: 0.25 | 30 days supply | Qty: 30 | Fill #0

## 2019-11-28 MED FILL — MODAFINIL 200 MG TABS: 200 | 30 days supply | Qty: 45 | Fill #1

## 2019-11-28 MED FILL — CHLORZOXAZONE 500 MG TABLET: 500 | 15 days supply | Qty: 60 | Fill #1

## 2019-11-28 MED FILL — PALIPERIDONE ER 9 MG TB24: 9 | 30 days supply | Qty: 30 | Fill #0

## 2019-11-28 MED FILL — busPIRone HCL 10 MG TABS: 10 | 30 days supply | Qty: 90 | Fill #0

## 2019-12-01 DIAGNOSIS — Z23 Encounter for immunization: Secondary | ICD-10-CM | POA: Diagnosis not present

## 2019-12-02 DIAGNOSIS — T8090XA Unspecified complication following infusion and therapeutic injection, initial encounter: Secondary | ICD-10-CM | POA: Diagnosis not present

## 2019-12-03 DIAGNOSIS — T8090XA Unspecified complication following infusion and therapeutic injection, initial encounter: Secondary | ICD-10-CM | POA: Diagnosis not present

## 2019-12-04 MED FILL — FLUCONAZOLE 150 MG TABS: 150 | 3 days supply | Qty: 2 | Fill #0

## 2019-12-04 MED FILL — DOXYCYCLINE HYCLATE 100 MG: 100 | 10 days supply | Qty: 20 | Fill #0

## 2019-12-12 MED FILL — TRI-LO-SPRINTEC TABLET: 0.18/0.215/ | 28 days supply | Qty: 28 | Fill #2

## 2019-12-12 MED FILL — REXULTI 0.25 MG TAB: 0.25 | 30 days supply | Qty: 30 | Fill #1

## 2019-12-12 MED FILL — CITALOPRAM HBR 20 MG TABLET: 20 | 30 days supply | Qty: 30 | Fill #1

## 2019-12-12 MED FILL — clonazePAM 0.5 MG TABS: 0.5 | 30 days supply | Qty: 15 | Fill #0

## 2019-12-20 ENCOUNTER — Other Ambulatory Visit (HOSPITAL_BASED_OUTPATIENT_CLINIC_OR_DEPARTMENT_OTHER): Payer: Self-pay | Admitting: Family Medicine

## 2019-12-20 DIAGNOSIS — F429 Obsessive-compulsive disorder, unspecified: Secondary | ICD-10-CM | POA: Diagnosis not present

## 2019-12-20 DIAGNOSIS — F250 Schizoaffective disorder, bipolar type: Secondary | ICD-10-CM | POA: Diagnosis not present

## 2019-12-20 DIAGNOSIS — F410 Panic disorder [episodic paroxysmal anxiety] without agoraphobia: Secondary | ICD-10-CM | POA: Diagnosis not present

## 2019-12-20 DIAGNOSIS — Z1339 Encounter for screening examination for other mental health and behavioral disorders: Secondary | ICD-10-CM | POA: Diagnosis not present

## 2019-12-20 MED FILL — hydrOXYzine HCL 25 MG TABS: 25 | 90 days supply | Qty: 90 | Fill #0

## 2019-12-20 MED FILL — INVEGA SUSTENNA 156 MG PREF: 156 | 30 days supply | Qty: 1 | Fill #0

## 2019-12-25 MED FILL — CARVEDILOL 3.125 MG TABLET: 3.125 | 90 days supply | Qty: 180 | Fill #1

## 2019-12-29 DIAGNOSIS — F429 Obsessive-compulsive disorder, unspecified: Secondary | ICD-10-CM | POA: Diagnosis not present

## 2019-12-29 DIAGNOSIS — F410 Panic disorder [episodic paroxysmal anxiety] without agoraphobia: Secondary | ICD-10-CM | POA: Diagnosis not present

## 2019-12-29 DIAGNOSIS — Z1339 Encounter for screening examination for other mental health and behavioral disorders: Secondary | ICD-10-CM | POA: Diagnosis not present

## 2019-12-29 DIAGNOSIS — F250 Schizoaffective disorder, bipolar type: Secondary | ICD-10-CM | POA: Diagnosis not present

## 2020-01-02 ENCOUNTER — Other Ambulatory Visit (HOSPITAL_BASED_OUTPATIENT_CLINIC_OR_DEPARTMENT_OTHER): Payer: Self-pay | Admitting: Family Medicine

## 2020-01-02 MED FILL — PALIPERIDONE ER 3 MG TB24: 3 | 30 days supply | Qty: 30 | Fill #0

## 2020-01-08 MED FILL — MODAFINIL 200 MG TABS: 200 | 30 days supply | Qty: 45 | Fill #0

## 2020-01-15 ENCOUNTER — Other Ambulatory Visit: Payer: Self-pay | Admitting: Family

## 2020-01-15 ENCOUNTER — Telehealth: Payer: 59 | Admitting: Family

## 2020-01-15 DIAGNOSIS — K219 Gastro-esophageal reflux disease without esophagitis: Secondary | ICD-10-CM | POA: Diagnosis not present

## 2020-01-15 MED ORDER — OMEPRAZOLE 40 MG PO CPDR
40.0000 mg | DELAYED_RELEASE_CAPSULE | Freq: Every day | ORAL | 3 refills | Status: DC
Start: 2020-01-15 — End: 2020-01-15

## 2020-01-15 MED FILL — clonazePAM 0.5 MG TABS: 0.5 | 30 days supply | Qty: 15 | Fill #1

## 2020-01-15 MED FILL — CITALOPRAM HBR 20 MG TABLET: 20 | 90 days supply | Qty: 90 | Fill #0

## 2020-01-15 MED FILL — OMEPRAZOLE 40 MG CPDR: 40 | 90 days supply | Qty: 90 | Fill #0

## 2020-01-15 NOTE — Progress Notes (Signed)
We are sorry that you are not feeling well.  Here is how we plan to help!  Based on what you shared with me it looks like you most likely have Gastroesophageal Reflux Disease (GERD)  Gastroesophageal reflux disease (GERD) happens when acid from your stomach flows up into the esophagus.  When acid comes in contact with the esophagus, the acid causes sorenss (inflammation) in the esophagus.  Over time, GERD may create small holes (ulcers) in the lining of the esophagus.  I have prescribed Omeprazole 40 mg one by mouth daily until you follow up with a provider.  Your symptoms should improve in the next day or two.  You can use antacids as needed until symptoms resolve.  Call us if your heartburn worsens, you have trouble swallowing, weight loss, spitting up blood or recurrent vomiting.  Home Care:  May include lifestyle changes such as weight loss, quitting smoking and alcohol consumption  Avoid foods and drinks that make your symptoms worse, such as:  Caffeine or alcoholic drinks  Chocolate  Peppermint or mint flavorings  Garlic and onions  Spicy foods  Citrus fruits, such as oranges, lemons, or limes  Tomato-based foods such as sauce, chili, salsa and pizza  Fried and fatty foods  Avoid lying down for 3 hours prior to your bedtime or prior to taking a nap  Eat small, frequent meals instead of a large meals  Wear loose-fitting clothing.  Do not wear anything tight around your waist that causes pressure on your stomach.  Raise the head of your bed 6 to 8 inches with wood blocks to help you sleep.  Extra pillows will not help.  Seek Help Right Away If:  You have pain in your arms, neck, jaw, teeth or back  Your pain increases or changes in intensity or duration  You develop nausea, vomiting or sweating (diaphoresis)  You develop shortness of breath or you faint  Your vomit is green, yellow, black or looks like coffee grounds or blood  Your stool is red, bloody or  black  These symptoms could be signs of other problems, such as heart disease, gastric bleeding or esophageal bleeding.  Make sure you :  Understand these instructions.  Will watch your condition.  Will get help right away if you are not doing well or get worse.  Your e-visit answers were reviewed by a board certified advanced clinical practitioner to complete your personal care plan.  Depending on the condition, your plan could have included both over the counter or prescription medications.  If there is a problem please reply  once you have received a response from your provider.  Your safety is important to [place].  If you have drug allergies check your prescription carefully.    You can use MyChart to ask questions about today's visit, request a non-urgent call back, or ask for a work or school excuse for 24 hours related to this e-Visit. If it has been greater than 24 hours you will need to follow up with your provider, or enter a new e-Visit to address those concerns.  You will get an e-mail in the next two days asking about your experience.  I hope that your e-visit has been valuable and will speed your recovery. Thank you for using e-visits.   Approximately 5 minutes was spent documenting and reviewing patient's chart.

## 2020-01-16 ENCOUNTER — Other Ambulatory Visit (HOSPITAL_BASED_OUTPATIENT_CLINIC_OR_DEPARTMENT_OTHER): Payer: Self-pay | Admitting: Endocrinology

## 2020-01-16 MED FILL — TRI-LO-SPRINTEC TABLET: 0.18/0.215/ | 28 days supply | Qty: 28 | Fill #0

## 2020-01-18 DIAGNOSIS — G47411 Narcolepsy with cataplexy: Secondary | ICD-10-CM | POA: Diagnosis not present

## 2020-01-18 DIAGNOSIS — F17200 Nicotine dependence, unspecified, uncomplicated: Secondary | ICD-10-CM | POA: Diagnosis not present

## 2020-01-18 DIAGNOSIS — G43009 Migraine without aura, not intractable, without status migrainosus: Secondary | ICD-10-CM | POA: Diagnosis not present

## 2020-01-18 DIAGNOSIS — G4719 Other hypersomnia: Secondary | ICD-10-CM | POA: Diagnosis not present

## 2020-01-19 ENCOUNTER — Other Ambulatory Visit (HOSPITAL_BASED_OUTPATIENT_CLINIC_OR_DEPARTMENT_OTHER): Payer: Self-pay | Admitting: Family Medicine

## 2020-01-19 DIAGNOSIS — R7303 Prediabetes: Secondary | ICD-10-CM | POA: Diagnosis not present

## 2020-01-19 DIAGNOSIS — F250 Schizoaffective disorder, bipolar type: Secondary | ICD-10-CM | POA: Diagnosis not present

## 2020-01-19 DIAGNOSIS — F410 Panic disorder [episodic paroxysmal anxiety] without agoraphobia: Secondary | ICD-10-CM | POA: Diagnosis not present

## 2020-01-19 DIAGNOSIS — F429 Obsessive-compulsive disorder, unspecified: Secondary | ICD-10-CM | POA: Diagnosis not present

## 2020-01-19 DIAGNOSIS — E661 Drug-induced obesity: Secondary | ICD-10-CM | POA: Diagnosis not present

## 2020-01-19 MED FILL — metFORMIN HCL ER 500 MG TB2: 500 | 30 days supply | Qty: 30 | Fill #0

## 2020-01-22 MED FILL — ATORVASTATIN CALCIUM 10 MG: 10 | 90 days supply | Qty: 90 | Fill #1

## 2020-01-22 MED FILL — REXULTI 0.25 MG TAB: 0.25 | 90 days supply | Qty: 90 | Fill #0

## 2020-01-26 ENCOUNTER — Ambulatory Visit: Payer: 59 | Admitting: Podiatry

## 2020-01-26 ENCOUNTER — Other Ambulatory Visit: Payer: Self-pay

## 2020-01-26 ENCOUNTER — Encounter: Payer: Self-pay | Admitting: Podiatry

## 2020-01-26 ENCOUNTER — Ambulatory Visit (INDEPENDENT_AMBULATORY_CARE_PROVIDER_SITE_OTHER): Payer: 59

## 2020-01-26 DIAGNOSIS — M722 Plantar fascial fibromatosis: Secondary | ICD-10-CM

## 2020-01-26 NOTE — Progress Notes (Signed)
Subjective:   Patient ID: Allison Cunningham, female   DOB: 39 y.o.   MRN: [id number]   HPI Patient states for the last few months her pain in her right heel has become severe again and bad.  She had surgery on her left including gastroc lengthening and that is done very well and states that the right has become very tender over this last 6 months and she does have history of it for a number of years   ROS      Objective:  Physical Exam  Neurovascular status intact with acute discomfort plantar fascial right at the insertion of the tendon into the calcaneus with well-healed left with more motion of the left ankle secondary to gastroc lengthening     Assessment:  Acute plantar fasciitis right with equinus     Plan:  H&P reviewed condition and discussed that it is possible surgical intervention will be necessary but I do want her to use her boot that she has at home I did do sterile prep and injected the fascia 3 mg Kenalog 5 mg Xylocaine and I went ahead today and I applied fascial brace.  Patient is also placed on oral anti-inflammatory and will be checked back in 4 weeks and hopefully this will reduce symptoms with strong possibility for ultimate surgical intervention if symptoms do not respond  X-ray indicates moderate cavus foot structure small spur no indication stress fracture arthritis

## 2020-01-26 NOTE — Patient Instructions (Signed)

## 2020-01-30 ENCOUNTER — Other Ambulatory Visit (HOSPITAL_BASED_OUTPATIENT_CLINIC_OR_DEPARTMENT_OTHER): Payer: Self-pay | Admitting: Family Medicine

## 2020-01-30 DIAGNOSIS — R509 Fever, unspecified: Secondary | ICD-10-CM | POA: Diagnosis not present

## 2020-01-30 DIAGNOSIS — R059 Cough, unspecified: Secondary | ICD-10-CM | POA: Diagnosis not present

## 2020-01-30 DIAGNOSIS — R062 Wheezing: Secondary | ICD-10-CM | POA: Diagnosis not present

## 2020-01-30 DIAGNOSIS — R52 Pain, unspecified: Secondary | ICD-10-CM | POA: Diagnosis not present

## 2020-01-30 MED FILL — ALBUTEROL SULFATE HFA 108 (: 108 (90 BAS | 17 days supply | Qty: 18 | Fill #0

## 2020-02-01 MED FILL — INVEGA SUSTENNA 156 MG PREF: 156 | 30 days supply | Qty: 1 | Fill #0

## 2020-02-03 DIAGNOSIS — J069 Acute upper respiratory infection, unspecified: Secondary | ICD-10-CM | POA: Diagnosis not present

## 2020-02-05 MED FILL — TOPIRAMATE 100 MG TABLET: 100 | 90 days supply | Qty: 90 | Fill #1

## 2020-02-08 ENCOUNTER — Other Ambulatory Visit (HOSPITAL_BASED_OUTPATIENT_CLINIC_OR_DEPARTMENT_OTHER): Payer: Self-pay | Admitting: Family Medicine

## 2020-02-08 DIAGNOSIS — F410 Panic disorder [episodic paroxysmal anxiety] without agoraphobia: Secondary | ICD-10-CM | POA: Diagnosis not present

## 2020-02-08 DIAGNOSIS — F429 Obsessive-compulsive disorder, unspecified: Secondary | ICD-10-CM | POA: Diagnosis not present

## 2020-02-08 DIAGNOSIS — E661 Drug-induced obesity: Secondary | ICD-10-CM | POA: Diagnosis not present

## 2020-02-08 DIAGNOSIS — R918 Other nonspecific abnormal finding of lung field: Secondary | ICD-10-CM | POA: Diagnosis not present

## 2020-02-08 DIAGNOSIS — R7303 Prediabetes: Secondary | ICD-10-CM | POA: Diagnosis not present

## 2020-02-08 DIAGNOSIS — R059 Cough, unspecified: Secondary | ICD-10-CM | POA: Diagnosis not present

## 2020-02-08 DIAGNOSIS — F250 Schizoaffective disorder, bipolar type: Secondary | ICD-10-CM | POA: Diagnosis not present

## 2020-02-08 MED FILL — BENZONATATE 200 MG CAPS: 200 | 7 days supply | Qty: 20 | Fill #0

## 2020-02-12 MED FILL — TRI-LO-SPRINTEC TABLET: 0.18/0.215/ | 28 days supply | Qty: 28 | Fill #1

## 2020-02-12 MED FILL — MODAFINIL 200 MG TABLET: 200 | 30 days supply | Qty: 45 | Fill #1

## 2020-02-15 ENCOUNTER — Ambulatory Visit: Payer: 59 | Admitting: Podiatry

## 2020-02-15 ENCOUNTER — Encounter: Payer: Self-pay | Admitting: Podiatry

## 2020-02-15 ENCOUNTER — Other Ambulatory Visit: Payer: Self-pay

## 2020-02-15 ENCOUNTER — Ambulatory Visit (INDEPENDENT_AMBULATORY_CARE_PROVIDER_SITE_OTHER): Payer: 59 | Admitting: Podiatry

## 2020-02-15 DIAGNOSIS — M216X1 Other acquired deformities of right foot: Secondary | ICD-10-CM | POA: Diagnosis not present

## 2020-02-15 DIAGNOSIS — M722 Plantar fascial fibromatosis: Secondary | ICD-10-CM | POA: Diagnosis not present

## 2020-02-15 MED FILL — metFORMIN HCL ER 500 MG TB2: 500 | 30 days supply | Qty: 30 | Fill #1

## 2020-02-15 MED FILL — clonazePAM 0.5 MG TABS: 0.5 | 90 days supply | Qty: 45 | Fill #0

## 2020-02-15 NOTE — Progress Notes (Signed)
Subjective:   Patient ID: Allison Cunningham, female   DOB: 39 y.o.   MRN: [id number]   HPI Patient presents stating I need to get my other foot fixed it is absolutely killing me   ROS      Objective:  Physical Exam  Neurovascular status intact with exquisite discomfort plantar aspect right heel with significant equinus condition that is painful when pressed and makes walking difficult     Assessment:  Planter fasciitis that did not respond conservatively right and is so severely tender that she is not interested in any other conservative care but wants surgical intervention with equinus of a significant nature noted with history of surgery left     Plan:  H&P condition reviewed.  I have recommended endoscopic fascial release along with gastroc lengthening and I allowed patient to review consent form going over alternative treatments complications.  She understands no guarantee as far success of surgery understands all possible complications and after extensive review signed consent form and just because 1 foot did so well there is no guarantee the other one will.  Patient is completely aware of this willing to accept risk signed consent form and is scheduled for outpatient surgery understanding total recovery can take 6 months to 1 year

## 2020-02-16 DIAGNOSIS — E221 Hyperprolactinemia: Secondary | ICD-10-CM | POA: Diagnosis not present

## 2020-02-16 DIAGNOSIS — Z789 Other specified health status: Secondary | ICD-10-CM | POA: Diagnosis not present

## 2020-02-16 DIAGNOSIS — F250 Schizoaffective disorder, bipolar type: Secondary | ICD-10-CM | POA: Diagnosis not present

## 2020-02-19 ENCOUNTER — Other Ambulatory Visit (HOSPITAL_BASED_OUTPATIENT_CLINIC_OR_DEPARTMENT_OTHER): Payer: Self-pay | Admitting: Family Medicine

## 2020-02-19 DIAGNOSIS — G47411 Narcolepsy with cataplexy: Secondary | ICD-10-CM | POA: Diagnosis not present

## 2020-02-19 DIAGNOSIS — G43009 Migraine without aura, not intractable, without status migrainosus: Secondary | ICD-10-CM | POA: Diagnosis not present

## 2020-02-19 DIAGNOSIS — F250 Schizoaffective disorder, bipolar type: Secondary | ICD-10-CM | POA: Diagnosis not present

## 2020-02-19 DIAGNOSIS — E669 Obesity, unspecified: Secondary | ICD-10-CM | POA: Diagnosis not present

## 2020-02-19 DIAGNOSIS — F17200 Nicotine dependence, unspecified, uncomplicated: Secondary | ICD-10-CM | POA: Diagnosis not present

## 2020-02-19 MED FILL — CHLORZOXAZONE 500 MG TABLET: 500 | 15 days supply | Qty: 60 | Fill #0

## 2020-02-21 ENCOUNTER — Telehealth: Payer: Self-pay

## 2020-02-21 NOTE — Telephone Encounter (Signed)
DOS 03/12/2020  EPF RT - 76160 GASTROCNEMIUS RECESS RT - 73710  [facility] EFFECTIVE DATE - 04/14/2019  PLAN DEDUCTIBLE - $300.00 W/ $0.00 REMAINING OUT OF POCKET - $7900.00 W/ $6269.48 REMAINING COPAY $0.00 COINSURANCE - 60%  SPOKE TO DEVON AT [facility], SHE STATED NO PRECERT REQUIRED FOR CPT [phone] OR [id number]. CALL REF # [id number]

## 2020-02-29 ENCOUNTER — Other Ambulatory Visit (HOSPITAL_BASED_OUTPATIENT_CLINIC_OR_DEPARTMENT_OTHER): Payer: Self-pay | Admitting: Family Medicine

## 2020-02-29 MED FILL — INVEGA SUSTENNA 156 MG PREF: 156 | 30 days supply | Qty: 1 | Fill #0

## 2020-03-04 ENCOUNTER — Other Ambulatory Visit (HOSPITAL_BASED_OUTPATIENT_CLINIC_OR_DEPARTMENT_OTHER): Payer: Self-pay | Admitting: Internal Medicine

## 2020-03-04 MED FILL — busPIRone HCL 10 MG TABS: 10 | 90 days supply | Qty: 270 | Fill #0

## 2020-03-04 MED FILL — CHLORZOXAZONE 500 MG TABLET: 500 | 15 days supply | Qty: 60 | Fill #0

## 2020-03-11 ENCOUNTER — Other Ambulatory Visit: Payer: Self-pay | Admitting: Podiatry

## 2020-03-11 DIAGNOSIS — F410 Panic disorder [episodic paroxysmal anxiety] without agoraphobia: Secondary | ICD-10-CM | POA: Diagnosis not present

## 2020-03-11 DIAGNOSIS — F429 Obsessive-compulsive disorder, unspecified: Secondary | ICD-10-CM | POA: Diagnosis not present

## 2020-03-11 DIAGNOSIS — E661 Drug-induced obesity: Secondary | ICD-10-CM | POA: Diagnosis not present

## 2020-03-11 DIAGNOSIS — R7303 Prediabetes: Secondary | ICD-10-CM | POA: Diagnosis not present

## 2020-03-11 DIAGNOSIS — F250 Schizoaffective disorder, bipolar type: Secondary | ICD-10-CM | POA: Diagnosis not present

## 2020-03-11 MED ORDER — HYDROCODONE-ACETAMINOPHEN 10-325 MG PO TABS: 1.0000 | ORAL_TABLET | Freq: Four times a day (QID) | ORAL | 0 refills | Status: DC | PRN

## 2020-03-11 MED FILL — HYDROCODON-APAP 10-325: 10-325 | 5 days supply | Qty: 20 | Fill #0

## 2020-03-11 MED FILL — TRI-LO-SPRINTEC TABLET: 0.18/0.215/ | 84 days supply | Qty: 84 | Fill #2

## 2020-03-11 NOTE — Addendum Note (Signed)
Addended by: Wallene Huh on: 03/11/2020 01:20 PM   Modules accepted: Orders

## 2020-03-12 ENCOUNTER — Encounter: Payer: Self-pay | Admitting: Podiatry

## 2020-03-12 ENCOUNTER — Other Ambulatory Visit (HOSPITAL_BASED_OUTPATIENT_CLINIC_OR_DEPARTMENT_OTHER): Payer: Self-pay | Admitting: Physician Assistant

## 2020-03-12 DIAGNOSIS — M216X1 Other acquired deformities of right foot: Secondary | ICD-10-CM | POA: Diagnosis not present

## 2020-03-12 DIAGNOSIS — M722 Plantar fascial fibromatosis: Secondary | ICD-10-CM | POA: Diagnosis not present

## 2020-03-12 DIAGNOSIS — M62461 Contracture of muscle, right lower leg: Secondary | ICD-10-CM | POA: Diagnosis not present

## 2020-03-12 MED FILL — MODAFINIL 200 MG TABLET: 200 | 30 days supply | Qty: 45 | Fill #0

## 2020-03-13 ENCOUNTER — Telehealth: Payer: Self-pay

## 2020-03-13 ENCOUNTER — Other Ambulatory Visit: Payer: Self-pay | Admitting: Podiatry

## 2020-03-13 MED ORDER — ONDANSETRON HCL 4 MG PO TABS: 4.0000 mg | ORAL_TABLET | Freq: Three times a day (TID) | ORAL | 0 refills | Status: DC | PRN

## 2020-03-13 NOTE — Progress Notes (Unsigned)
zof

## 2020-03-13 NOTE — Telephone Encounter (Signed)
Patient had surgery on 03/12/20. She is experiencing some nausea and is requesting medication for nausea be sent to her pharmacy on file. Please send or advise. Thanks

## 2020-03-13 NOTE — Telephone Encounter (Signed)
done

## 2020-03-18 ENCOUNTER — Other Ambulatory Visit (HOSPITAL_BASED_OUTPATIENT_CLINIC_OR_DEPARTMENT_OTHER): Payer: Self-pay | Admitting: Family Medicine

## 2020-03-18 ENCOUNTER — Other Ambulatory Visit: Payer: Self-pay

## 2020-03-18 ENCOUNTER — Ambulatory Visit (INDEPENDENT_AMBULATORY_CARE_PROVIDER_SITE_OTHER): Payer: 59 | Admitting: Podiatry

## 2020-03-18 ENCOUNTER — Encounter: Payer: Self-pay | Admitting: Podiatry

## 2020-03-18 DIAGNOSIS — M722 Plantar fascial fibromatosis: Secondary | ICD-10-CM

## 2020-03-18 DIAGNOSIS — M216X1 Other acquired deformities of right foot: Secondary | ICD-10-CM

## 2020-03-18 MED FILL — CHLORZOXAZONE 500 MG TABLET: 500 | 15 days supply | Qty: 60 | Fill #1

## 2020-03-18 MED FILL — metFORMIN HCL ER 500 MG TB2: 500 | 30 days supply | Qty: 30 | Fill #0

## 2020-03-20 NOTE — Progress Notes (Signed)
Subjective:   Patient ID: Allison Cunningham, female   DOB: 39 y.o.   MRN: [id number]   HPI Patient states she is doing pretty well but is having a reasonable amount of pain   ROS      Objective:  Physical Exam  Neurovascular status intact negative Bevelyn Buckles' sign noted wound edges well coapted posterior calf and heel with good range of motion no indications of Achilles tendon pathology with good muscle strength     Assessment:  Doing well post gastroc recession endoscopic surgery right     Plan:  H&P reviewed condition reapplied sterile dressing instructed to continue immobilization gradual increase in weightbearing and reappoint 2 weeks suture removal or earlier if needed

## 2020-04-02 ENCOUNTER — Other Ambulatory Visit (HOSPITAL_BASED_OUTPATIENT_CLINIC_OR_DEPARTMENT_OTHER): Payer: Self-pay | Admitting: Family Medicine

## 2020-04-02 MED FILL — INVEGA SUSTENNA 156 MG PREF: 156 | 30 days supply | Qty: 1 | Fill #0

## 2020-04-04 ENCOUNTER — Other Ambulatory Visit: Payer: Self-pay

## 2020-04-04 ENCOUNTER — Ambulatory Visit (INDEPENDENT_AMBULATORY_CARE_PROVIDER_SITE_OTHER): Payer: 59 | Admitting: Podiatry

## 2020-04-04 ENCOUNTER — Encounter: Payer: Self-pay | Admitting: Podiatry

## 2020-04-04 DIAGNOSIS — M722 Plantar fascial fibromatosis: Secondary | ICD-10-CM

## 2020-04-04 DIAGNOSIS — M216X1 Other acquired deformities of right foot: Secondary | ICD-10-CM

## 2020-04-04 NOTE — Progress Notes (Signed)
Subjective:   Patient ID: Allison Cunningham, female   DOB: 39 y.o.   MRN: [id number]   HPI Patient states she is doing very well here for stitch removal and needs a new boot as she needs to continue to wear this at her boot has worn out from 3 years ago   ROS      Objective:  Physical Exam  Neurovascular status intact negative Homan sign noted with patient's stitches intact on the right lower leg and the right medial lateral foot     Assessment:  Doing well post gastroc lengthening along with endoscopic release right     Plan:  Stitches removed H&P performed and condition discussed.  She does need to continue boot for about 3 more weeks and her boot is no longer functioning from 3 years ago so I did dispense air fracture walker for the current usage and I advised her on gradual return to soft shoe gear of the next couple weeks and dispensed ankle compression stocking.  Reappoint as needed

## 2020-04-09 ENCOUNTER — Other Ambulatory Visit (HOSPITAL_BASED_OUTPATIENT_CLINIC_OR_DEPARTMENT_OTHER): Payer: Self-pay | Admitting: Family Medicine

## 2020-04-09 DIAGNOSIS — R7303 Prediabetes: Secondary | ICD-10-CM | POA: Diagnosis not present

## 2020-04-09 DIAGNOSIS — F250 Schizoaffective disorder, bipolar type: Secondary | ICD-10-CM | POA: Diagnosis not present

## 2020-04-09 DIAGNOSIS — F429 Obsessive-compulsive disorder, unspecified: Secondary | ICD-10-CM | POA: Diagnosis not present

## 2020-04-09 DIAGNOSIS — E661 Drug-induced obesity: Secondary | ICD-10-CM | POA: Diagnosis not present

## 2020-04-09 DIAGNOSIS — F410 Panic disorder [episodic paroxysmal anxiety] without agoraphobia: Secondary | ICD-10-CM | POA: Diagnosis not present

## 2020-04-09 MED FILL — hydrOXYzine HCL 25 MG TABS: 25 | 90 days supply | Qty: 90 | Fill #0

## 2020-04-10 DIAGNOSIS — F250 Schizoaffective disorder, bipolar type: Secondary | ICD-10-CM | POA: Diagnosis not present

## 2020-04-10 DIAGNOSIS — F429 Obsessive-compulsive disorder, unspecified: Secondary | ICD-10-CM | POA: Diagnosis not present

## 2020-04-10 DIAGNOSIS — E661 Drug-induced obesity: Secondary | ICD-10-CM | POA: Diagnosis not present

## 2020-04-10 DIAGNOSIS — F410 Panic disorder [episodic paroxysmal anxiety] without agoraphobia: Secondary | ICD-10-CM | POA: Diagnosis not present

## 2020-04-10 DIAGNOSIS — R7303 Prediabetes: Secondary | ICD-10-CM | POA: Diagnosis not present

## 2020-04-11 MED FILL — metFORMIN HCL ER 500 MG TB2: 500 | 60 days supply | Qty: 60 | Fill #0

## 2020-04-15 MED FILL — CITALOPRAM HBR 20 MG TABLET: 20 | 90 days supply | Qty: 90 | Fill #0

## 2020-04-15 MED FILL — OMEPRAZOLE 40 MG CPDR: 40 | 30 days supply | Qty: 30 | Fill #1

## 2020-04-18 ENCOUNTER — Other Ambulatory Visit: Payer: Self-pay | Admitting: Podiatry

## 2020-04-18 ENCOUNTER — Telehealth: Payer: Self-pay

## 2020-04-18 MED ORDER — DICLOFENAC SODIUM 75 MG PO TBEC
75.0000 mg | DELAYED_RELEASE_TABLET | Freq: Two times a day (BID) | ORAL | 2 refills | Status: AC
Start: 2020-04-18 — End: ?

## 2020-04-18 NOTE — Telephone Encounter (Signed)
Pt had surgery 03/12/20. She right foot is still swollen. Pt has to go back to work but cant even get her foot into her shoe. Please advise. Not sure if pt had received compression sock or etc.

## 2020-04-18 NOTE — Telephone Encounter (Signed)
She can have another couple weeks off and I will call her in antinflammatory

## 2020-04-29 MED FILL — REXULTI 0.25 MG TAB: 0.25 | 90 days supply | Qty: 90 | Fill #0

## 2020-04-29 MED FILL — CARVEDILOL 3.125 MG TABLET: 3.125 | 90 days supply | Qty: 180 | Fill #0

## 2020-04-29 MED FILL — ATORVASTATIN CALCIUM 10 MG: 10 | 90 days supply | Qty: 90 | Fill #0

## 2020-04-29 MED FILL — MODAFINIL 200 MG TABLET: 200 | 30 days supply | Qty: 45 | Fill #1

## 2020-05-01 ENCOUNTER — Other Ambulatory Visit (HOSPITAL_BASED_OUTPATIENT_CLINIC_OR_DEPARTMENT_OTHER): Payer: Self-pay | Admitting: Family Medicine

## 2020-05-01 MED FILL — TOPIRAMATE 100 MG TABLET: 100 | 90 days supply | Qty: 90 | Fill #0

## 2020-05-06 ENCOUNTER — Other Ambulatory Visit (HOSPITAL_BASED_OUTPATIENT_CLINIC_OR_DEPARTMENT_OTHER): Payer: Self-pay | Admitting: Family Medicine

## 2020-05-06 MED FILL — INVEGA SUSTENNA 156 MG PREF: 156 | 30 days supply | Qty: 1 | Fill #0

## 2020-05-16 DIAGNOSIS — M7989 Other specified soft tissue disorders: Secondary | ICD-10-CM | POA: Diagnosis not present

## 2020-05-16 DIAGNOSIS — F17210 Nicotine dependence, cigarettes, uncomplicated: Secondary | ICD-10-CM | POA: Diagnosis not present

## 2020-05-16 DIAGNOSIS — E221 Hyperprolactinemia: Secondary | ICD-10-CM | POA: Diagnosis not present

## 2020-05-16 DIAGNOSIS — E7849 Other hyperlipidemia: Secondary | ICD-10-CM | POA: Diagnosis not present

## 2020-05-16 DIAGNOSIS — E669 Obesity, unspecified: Secondary | ICD-10-CM | POA: Diagnosis not present

## 2020-05-16 DIAGNOSIS — F250 Schizoaffective disorder, bipolar type: Secondary | ICD-10-CM | POA: Diagnosis not present

## 2020-05-16 DIAGNOSIS — G43109 Migraine with aura, not intractable, without status migrainosus: Secondary | ICD-10-CM | POA: Diagnosis not present

## 2020-05-16 DIAGNOSIS — F429 Obsessive-compulsive disorder, unspecified: Secondary | ICD-10-CM | POA: Diagnosis not present

## 2020-05-20 ENCOUNTER — Other Ambulatory Visit (HOSPITAL_BASED_OUTPATIENT_CLINIC_OR_DEPARTMENT_OTHER): Payer: Self-pay | Admitting: Internal Medicine

## 2020-05-20 MED FILL — FENOFIBRATE 160 MG TABLET: 160 | 30 days supply | Qty: 30 | Fill #0

## 2020-05-20 MED FILL — clonazePAM 0.5 MG TABS: 0.5 | 90 days supply | Qty: 45 | Fill #0

## 2020-05-21 MED FILL — OMEPRAZOLE 40 MG CPDR: 40 | 90 days supply | Qty: 90 | Fill #0

## 2020-05-24 ENCOUNTER — Other Ambulatory Visit (HOSPITAL_BASED_OUTPATIENT_CLINIC_OR_DEPARTMENT_OTHER): Payer: Self-pay | Admitting: Physician Assistant

## 2020-05-24 DIAGNOSIS — I82441 Acute embolism and thrombosis of right tibial vein: Secondary | ICD-10-CM | POA: Diagnosis not present

## 2020-05-24 DIAGNOSIS — M7989 Other specified soft tissue disorders: Secondary | ICD-10-CM | POA: Diagnosis not present

## 2020-05-24 MED FILL — ELIQUIS STARTER PACK 5 MG T: 5 | 30 days supply | Qty: 60 | Fill #0

## 2020-05-30 ENCOUNTER — Other Ambulatory Visit: Payer: Self-pay

## 2020-05-30 ENCOUNTER — Ambulatory Visit (HOSPITAL_COMMUNITY)
Admission: RE | Admit: 2020-05-30 | Discharge: 2020-05-30 | Disposition: A | Discharge status: Home / Self Care | Payer: 59 | Source: Ambulatory Visit | Attending: Cardiology | Admitting: Cardiology

## 2020-05-30 ENCOUNTER — Telehealth: Payer: Self-pay | Admitting: Internal Medicine

## 2020-05-30 DIAGNOSIS — R609 Edema, unspecified: Secondary | ICD-10-CM

## 2020-05-30 DIAGNOSIS — I82401 Acute embolism and thrombosis of unspecified deep veins of right lower extremity: Secondary | ICD-10-CM | POA: Diagnosis not present

## 2020-05-30 NOTE — Addendum Note (Signed)
Addended by: Rodman Key on: 05/30/2020 11:54 AM   Modules accepted: Orders

## 2020-05-30 NOTE — Telephone Encounter (Signed)
Order placed.  Message to [facility] for scheduling.

## 2020-05-30 NOTE — Telephone Encounter (Signed)
Spoke to pt on phone.  REcent foot surgery, complicated by DVT   Continues to have discomfort and swelling in R leg. I have spoken to [facility] Pierre Bali)   Would recomm repeat USN to see if there is progression of DVT (she is on Eliquis)  IF progression would consider compression or further depending on extent.

## 2020-05-31 DIAGNOSIS — I824Y1 Acute embolism and thrombosis of unspecified deep veins of right proximal lower extremity: Secondary | ICD-10-CM | POA: Diagnosis not present

## 2020-05-31 MED FILL — metFORMIN HCL ER 500 MG TB2: 500 | 30 days supply | Qty: 30 | Fill #1

## 2020-06-03 MED FILL — busPIRone HCL 10 MG TABS: 10 | 90 days supply | Qty: 270 | Fill #0

## 2020-06-03 MED FILL — MODAFINIL 200 MG TABLET: 200 | 30 days supply | Qty: 45 | Fill #0

## 2020-06-05 MED FILL — TRI-LO-SPRINTEC TABLET: 0.18/0.215/ | 56 days supply | Qty: 56 | Fill #3

## 2020-06-10 ENCOUNTER — Other Ambulatory Visit (HOSPITAL_BASED_OUTPATIENT_CLINIC_OR_DEPARTMENT_OTHER): Payer: Self-pay | Admitting: Family Medicine

## 2020-06-10 DIAGNOSIS — E661 Drug-induced obesity: Secondary | ICD-10-CM | POA: Diagnosis not present

## 2020-06-10 DIAGNOSIS — F410 Panic disorder [episodic paroxysmal anxiety] without agoraphobia: Secondary | ICD-10-CM | POA: Diagnosis not present

## 2020-06-10 DIAGNOSIS — F250 Schizoaffective disorder, bipolar type: Secondary | ICD-10-CM | POA: Diagnosis not present

## 2020-06-10 DIAGNOSIS — F429 Obsessive-compulsive disorder, unspecified: Secondary | ICD-10-CM | POA: Diagnosis not present

## 2020-06-10 DIAGNOSIS — R7303 Prediabetes: Secondary | ICD-10-CM | POA: Diagnosis not present

## 2020-06-10 MED FILL — OXcarbazepine 300 MG TABS: 300 | 30 days supply | Qty: 30 | Fill #0

## 2020-06-10 MED FILL — INVEGA SUSTENNA 156 MG PREF: 156 | 30 days supply | Qty: 1 | Fill #0

## 2020-06-10 MED FILL — METFORMIN HCL ER 750 MG TB2: 750 | 30 days supply | Qty: 30 | Fill #0

## 2020-06-14 ENCOUNTER — Other Ambulatory Visit (HOSPITAL_BASED_OUTPATIENT_CLINIC_OR_DEPARTMENT_OTHER): Payer: Self-pay

## 2020-06-14 DIAGNOSIS — Z886 Allergy status to analgesic agent status: Secondary | ICD-10-CM | POA: Diagnosis not present

## 2020-06-14 DIAGNOSIS — Z883 Allergy status to other anti-infective agents status: Secondary | ICD-10-CM | POA: Diagnosis not present

## 2020-06-14 DIAGNOSIS — Z880 Allergy status to penicillin: Secondary | ICD-10-CM | POA: Diagnosis not present

## 2020-06-14 DIAGNOSIS — E785 Hyperlipidemia, unspecified: Secondary | ICD-10-CM | POA: Diagnosis not present

## 2020-06-14 DIAGNOSIS — Z86718 Personal history of other venous thrombosis and embolism: Secondary | ICD-10-CM | POA: Diagnosis not present

## 2020-06-14 DIAGNOSIS — Z79899 Other long term (current) drug therapy: Secondary | ICD-10-CM | POA: Diagnosis not present

## 2020-06-14 DIAGNOSIS — F419 Anxiety disorder, unspecified: Secondary | ICD-10-CM | POA: Diagnosis not present

## 2020-06-14 DIAGNOSIS — F32A Depression, unspecified: Secondary | ICD-10-CM | POA: Diagnosis not present

## 2020-06-14 DIAGNOSIS — R2241 Localized swelling, mass and lump, right lower limb: Secondary | ICD-10-CM | POA: Diagnosis not present

## 2020-06-14 DIAGNOSIS — M79604 Pain in right leg: Secondary | ICD-10-CM | POA: Diagnosis not present

## 2020-06-14 DIAGNOSIS — F17210 Nicotine dependence, cigarettes, uncomplicated: Secondary | ICD-10-CM | POA: Diagnosis not present

## 2020-06-14 MED FILL — traMADol HCL 50 MG TABS: 50 | 3 days supply | Qty: 12 | Fill #0

## 2020-06-17 ENCOUNTER — Other Ambulatory Visit (HOSPITAL_BASED_OUTPATIENT_CLINIC_OR_DEPARTMENT_OTHER): Payer: Self-pay | Admitting: Internal Medicine

## 2020-06-17 MED FILL — FENOFIBRATE 160 MG TABLET: 160 | 30 days supply | Qty: 30 | Fill #1

## 2020-06-18 DIAGNOSIS — R7303 Prediabetes: Secondary | ICD-10-CM | POA: Diagnosis not present

## 2020-06-18 DIAGNOSIS — F250 Schizoaffective disorder, bipolar type: Secondary | ICD-10-CM | POA: Diagnosis not present

## 2020-06-18 DIAGNOSIS — F410 Panic disorder [episodic paroxysmal anxiety] without agoraphobia: Secondary | ICD-10-CM | POA: Diagnosis not present

## 2020-06-18 DIAGNOSIS — E661 Drug-induced obesity: Secondary | ICD-10-CM | POA: Diagnosis not present

## 2020-06-18 DIAGNOSIS — F429 Obsessive-compulsive disorder, unspecified: Secondary | ICD-10-CM | POA: Diagnosis not present

## 2020-06-19 MED FILL — ELIQUIS 5 MG TABLET: 5 | 60 days supply | Qty: 120 | Fill #0

## 2020-07-01 MED FILL — MODAFINIL 200 MG TABS: 200 | 30 days supply | Qty: 45 | Fill #1

## 2020-07-08 MED FILL — METFORMIN HCL ER 750 MG TB2: 750 | 30 days supply | Qty: 30 | Fill #1

## 2020-07-09 ENCOUNTER — Other Ambulatory Visit (HOSPITAL_BASED_OUTPATIENT_CLINIC_OR_DEPARTMENT_OTHER): Payer: Self-pay | Admitting: Family Medicine

## 2020-07-09 DIAGNOSIS — F410 Panic disorder [episodic paroxysmal anxiety] without agoraphobia: Secondary | ICD-10-CM | POA: Diagnosis not present

## 2020-07-09 DIAGNOSIS — R7303 Prediabetes: Secondary | ICD-10-CM | POA: Diagnosis not present

## 2020-07-09 DIAGNOSIS — F250 Schizoaffective disorder, bipolar type: Secondary | ICD-10-CM | POA: Diagnosis not present

## 2020-07-09 DIAGNOSIS — E661 Drug-induced obesity: Secondary | ICD-10-CM | POA: Diagnosis not present

## 2020-07-09 DIAGNOSIS — F429 Obsessive-compulsive disorder, unspecified: Secondary | ICD-10-CM | POA: Diagnosis not present

## 2020-07-09 MED FILL — hydrOXYzine HCL 25 MG TABS: 25 | 90 days supply | Qty: 90 | Fill #0

## 2020-07-09 MED FILL — OXcarbazepine 600 MG TABS: 600 | 30 days supply | Qty: 30 | Fill #0

## 2020-07-16 DIAGNOSIS — R1031 Right lower quadrant pain: Secondary | ICD-10-CM | POA: Diagnosis not present

## 2020-07-16 DIAGNOSIS — R1032 Left lower quadrant pain: Secondary | ICD-10-CM | POA: Diagnosis not present

## 2020-07-16 DIAGNOSIS — Z883 Allergy status to other anti-infective agents status: Secondary | ICD-10-CM | POA: Diagnosis not present

## 2020-07-16 DIAGNOSIS — Z882 Allergy status to sulfonamides status: Secondary | ICD-10-CM | POA: Diagnosis not present

## 2020-07-16 DIAGNOSIS — Z886 Allergy status to analgesic agent status: Secondary | ICD-10-CM | POA: Diagnosis not present

## 2020-07-16 DIAGNOSIS — F17210 Nicotine dependence, cigarettes, uncomplicated: Secondary | ICD-10-CM | POA: Diagnosis not present

## 2020-07-16 DIAGNOSIS — F32A Depression, unspecified: Secondary | ICD-10-CM | POA: Diagnosis not present

## 2020-07-16 DIAGNOSIS — Z880 Allergy status to penicillin: Secondary | ICD-10-CM | POA: Diagnosis not present

## 2020-07-16 DIAGNOSIS — R110 Nausea: Secondary | ICD-10-CM | POA: Diagnosis not present

## 2020-07-16 DIAGNOSIS — E785 Hyperlipidemia, unspecified: Secondary | ICD-10-CM | POA: Diagnosis not present

## 2020-07-16 DIAGNOSIS — N83201 Unspecified ovarian cyst, right side: Secondary | ICD-10-CM | POA: Diagnosis not present

## 2020-07-16 DIAGNOSIS — K5730 Diverticulosis of large intestine without perforation or abscess without bleeding: Secondary | ICD-10-CM | POA: Diagnosis not present

## 2020-07-17 ENCOUNTER — Other Ambulatory Visit (HOSPITAL_BASED_OUTPATIENT_CLINIC_OR_DEPARTMENT_OTHER): Payer: Self-pay

## 2020-07-17 DIAGNOSIS — E785 Hyperlipidemia, unspecified: Secondary | ICD-10-CM | POA: Diagnosis not present

## 2020-07-17 DIAGNOSIS — Z882 Allergy status to sulfonamides status: Secondary | ICD-10-CM | POA: Diagnosis not present

## 2020-07-17 DIAGNOSIS — N83201 Unspecified ovarian cyst, right side: Secondary | ICD-10-CM | POA: Diagnosis not present

## 2020-07-17 DIAGNOSIS — F17210 Nicotine dependence, cigarettes, uncomplicated: Secondary | ICD-10-CM | POA: Diagnosis not present

## 2020-07-17 DIAGNOSIS — Z886 Allergy status to analgesic agent status: Secondary | ICD-10-CM | POA: Diagnosis not present

## 2020-07-17 DIAGNOSIS — F32A Depression, unspecified: Secondary | ICD-10-CM | POA: Diagnosis not present

## 2020-07-17 DIAGNOSIS — Z880 Allergy status to penicillin: Secondary | ICD-10-CM | POA: Diagnosis not present

## 2020-07-17 DIAGNOSIS — K5730 Diverticulosis of large intestine without perforation or abscess without bleeding: Secondary | ICD-10-CM | POA: Diagnosis not present

## 2020-07-17 DIAGNOSIS — R1032 Left lower quadrant pain: Secondary | ICD-10-CM | POA: Diagnosis not present

## 2020-07-17 DIAGNOSIS — R1031 Right lower quadrant pain: Secondary | ICD-10-CM | POA: Diagnosis not present

## 2020-07-17 DIAGNOSIS — R110 Nausea: Secondary | ICD-10-CM | POA: Diagnosis not present

## 2020-07-17 DIAGNOSIS — Z883 Allergy status to other anti-infective agents status: Secondary | ICD-10-CM | POA: Diagnosis not present

## 2020-07-17 MED ORDER — HYDROCODONE-ACETAMINOPHEN 5-325 MG PO TABS
ORAL_TABLET | ORAL | 0 refills | Status: DC
  Filled 2020-07-17: qty 8, 3d supply, fill #0

## 2020-07-17 MED ORDER — ONDANSETRON 8 MG PO TBDP
ORAL_TABLET | ORAL | 0 refills | Status: DC
  Filled 2020-07-17: qty 20, 6d supply, fill #0

## 2020-07-17 MED ORDER — KETOROLAC TROMETHAMINE 10 MG PO TABS
ORAL_TABLET | ORAL | 0 refills | Status: DC
  Filled 2020-07-17: qty 20, 5d supply, fill #0

## 2020-07-17 MED FILL — Citalopram Hydrobromide Tab 20 MG (Base Equiv): ORAL | 90 days supply | Qty: 90 | Fill #0 | Status: AC

## 2020-07-18 ENCOUNTER — Other Ambulatory Visit (HOSPITAL_COMMUNITY): Payer: Self-pay

## 2020-07-18 ENCOUNTER — Other Ambulatory Visit (HOSPITAL_BASED_OUTPATIENT_CLINIC_OR_DEPARTMENT_OTHER): Payer: Self-pay

## 2020-07-18 DIAGNOSIS — G4719 Other hypersomnia: Secondary | ICD-10-CM | POA: Diagnosis not present

## 2020-07-18 DIAGNOSIS — G47411 Narcolepsy with cataplexy: Secondary | ICD-10-CM | POA: Diagnosis not present

## 2020-07-18 DIAGNOSIS — Z6841 Body Mass Index (BMI) 40.0 and over, adult: Secondary | ICD-10-CM | POA: Diagnosis not present

## 2020-07-18 DIAGNOSIS — G43009 Migraine without aura, not intractable, without status migrainosus: Secondary | ICD-10-CM | POA: Diagnosis not present

## 2020-07-18 DIAGNOSIS — F17200 Nicotine dependence, unspecified, uncomplicated: Secondary | ICD-10-CM | POA: Diagnosis not present

## 2020-07-18 MED ORDER — MODAFINIL 200 MG PO TABS
ORAL_TABLET | ORAL | 1 refills | Status: DC
  Filled 2020-07-18 – 2020-08-19 (×2): qty 45, 30d supply, fill #0
  Filled 2020-11-13: qty 45, 30d supply, fill #1

## 2020-07-18 MED ORDER — FENOFIBRATE 160 MG PO TABS
ORAL_TABLET | ORAL | 1 refills | Status: DC
  Filled 2020-07-18 – 2020-08-06 (×2): qty 90, 90d supply, fill #0
  Filled 2020-11-02 – 2020-11-13 (×2): qty 90, 90d supply, fill #1

## 2020-07-18 MED FILL — Paliperidone Palmitate ER Susp Pref Syr 156 MG/ML: INTRAMUSCULAR | 30 days supply | Qty: 1 | Fill #0 | Status: CN

## 2020-07-19 ENCOUNTER — Other Ambulatory Visit (HOSPITAL_BASED_OUTPATIENT_CLINIC_OR_DEPARTMENT_OTHER): Payer: Self-pay

## 2020-07-19 DIAGNOSIS — F250 Schizoaffective disorder, bipolar type: Secondary | ICD-10-CM | POA: Diagnosis not present

## 2020-07-26 ENCOUNTER — Other Ambulatory Visit (HOSPITAL_BASED_OUTPATIENT_CLINIC_OR_DEPARTMENT_OTHER): Payer: Self-pay

## 2020-07-27 MED FILL — Atorvastatin Calcium Tab 10 MG (Base Equivalent): ORAL | 90 days supply | Qty: 90 | Fill #0 | Status: AC

## 2020-07-27 MED FILL — Carvedilol Tab 3.125 MG: ORAL | 90 days supply | Qty: 180 | Fill #0 | Status: AC

## 2020-07-27 MED FILL — Topiramate Tab 100 MG: ORAL | 90 days supply | Qty: 90 | Fill #0 | Status: AC

## 2020-07-29 ENCOUNTER — Other Ambulatory Visit (HOSPITAL_BASED_OUTPATIENT_CLINIC_OR_DEPARTMENT_OTHER): Payer: Self-pay

## 2020-07-29 MED ORDER — ATORVASTATIN CALCIUM 10 MG PO TABS
ORAL_TABLET | ORAL | 1 refills | Status: DC
  Filled 2020-07-29 – 2020-10-18 (×2): qty 90, 90d supply, fill #0
  Filled 2021-01-13: qty 90, 90d supply, fill #1

## 2020-07-29 MED ORDER — CHLORZOXAZONE 500 MG PO TABS
ORAL_TABLET | ORAL | 3 refills | Status: DC
  Filled 2020-07-29: qty 60, 15d supply, fill #0
  Filled 2021-04-23: qty 60, 15d supply, fill #1

## 2020-07-29 MED ORDER — SUMATRIPTAN SUCCINATE 100 MG PO TABS
ORAL_TABLET | ORAL | 5 refills | Status: AC
  Filled 2020-07-29: qty 9, 30d supply, fill #0
  Filled 2020-08-28: qty 9, 30d supply, fill #1
  Filled 2021-04-23: qty 9, 30d supply, fill #2

## 2020-07-30 ENCOUNTER — Other Ambulatory Visit (HOSPITAL_BASED_OUTPATIENT_CLINIC_OR_DEPARTMENT_OTHER): Payer: Self-pay

## 2020-07-30 DIAGNOSIS — A09 Infectious gastroenteritis and colitis, unspecified: Secondary | ICD-10-CM | POA: Diagnosis not present

## 2020-07-30 DIAGNOSIS — N6489 Other specified disorders of breast: Secondary | ICD-10-CM | POA: Diagnosis not present

## 2020-07-30 DIAGNOSIS — Z7901 Long term (current) use of anticoagulants: Secondary | ICD-10-CM | POA: Diagnosis not present

## 2020-07-30 DIAGNOSIS — Z886 Allergy status to analgesic agent status: Secondary | ICD-10-CM | POA: Diagnosis not present

## 2020-07-30 DIAGNOSIS — Z79899 Other long term (current) drug therapy: Secondary | ICD-10-CM | POA: Diagnosis not present

## 2020-07-30 DIAGNOSIS — R10819 Abdominal tenderness, unspecified site: Secondary | ICD-10-CM | POA: Diagnosis not present

## 2020-07-30 DIAGNOSIS — Z880 Allergy status to penicillin: Secondary | ICD-10-CM | POA: Diagnosis not present

## 2020-07-30 DIAGNOSIS — Z888 Allergy status to other drugs, medicaments and biological substances status: Secondary | ICD-10-CM | POA: Diagnosis not present

## 2020-07-30 DIAGNOSIS — Z20822 Contact with and (suspected) exposure to covid-19: Secondary | ICD-10-CM | POA: Diagnosis not present

## 2020-07-30 DIAGNOSIS — Z87892 Personal history of anaphylaxis: Secondary | ICD-10-CM | POA: Diagnosis not present

## 2020-07-30 DIAGNOSIS — R197 Diarrhea, unspecified: Secondary | ICD-10-CM | POA: Diagnosis not present

## 2020-07-30 DIAGNOSIS — Z7984 Long term (current) use of oral hypoglycemic drugs: Secondary | ICD-10-CM | POA: Diagnosis not present

## 2020-07-30 DIAGNOSIS — R0602 Shortness of breath: Secondary | ICD-10-CM | POA: Diagnosis not present

## 2020-07-30 DIAGNOSIS — Z882 Allergy status to sulfonamides status: Secondary | ICD-10-CM | POA: Diagnosis not present

## 2020-07-31 ENCOUNTER — Other Ambulatory Visit (HOSPITAL_BASED_OUTPATIENT_CLINIC_OR_DEPARTMENT_OTHER): Payer: Self-pay

## 2020-07-31 MED ORDER — DICYCLOMINE HCL 20 MG PO TABS
ORAL_TABLET | ORAL | 0 refills | Status: DC
  Filled 2020-07-31: qty 10, 5d supply, fill #0

## 2020-08-05 ENCOUNTER — Other Ambulatory Visit (HOSPITAL_BASED_OUTPATIENT_CLINIC_OR_DEPARTMENT_OTHER): Payer: Self-pay

## 2020-08-05 DIAGNOSIS — F250 Schizoaffective disorder, bipolar type: Secondary | ICD-10-CM | POA: Diagnosis not present

## 2020-08-05 DIAGNOSIS — R7303 Prediabetes: Secondary | ICD-10-CM | POA: Diagnosis not present

## 2020-08-05 DIAGNOSIS — F410 Panic disorder [episodic paroxysmal anxiety] without agoraphobia: Secondary | ICD-10-CM | POA: Diagnosis not present

## 2020-08-05 DIAGNOSIS — E661 Drug-induced obesity: Secondary | ICD-10-CM | POA: Diagnosis not present

## 2020-08-05 DIAGNOSIS — F429 Obsessive-compulsive disorder, unspecified: Secondary | ICD-10-CM | POA: Diagnosis not present

## 2020-08-05 MED ORDER — OXCARBAZEPINE 600 MG PO TABS
ORAL_TABLET | ORAL | 0 refills | Status: DC
  Filled 2020-08-05: qty 90, 90d supply, fill #0

## 2020-08-05 MED ORDER — INVEGA SUSTENNA 156 MG/ML IM SUSY
PREFILLED_SYRINGE | INTRAMUSCULAR | 2 refills | Status: DC
  Filled 2020-08-05: qty 1, 30d supply, fill #0
  Filled 2020-09-13: qty 1, 30d supply, fill #1

## 2020-08-05 MED ORDER — METFORMIN HCL ER 750 MG PO TB24
ORAL_TABLET | ORAL | 1 refills | Status: DC
  Filled 2020-08-05: qty 30, 30d supply, fill #0
  Filled 2020-08-31: qty 30, 30d supply, fill #1

## 2020-08-06 ENCOUNTER — Other Ambulatory Visit (HOSPITAL_BASED_OUTPATIENT_CLINIC_OR_DEPARTMENT_OTHER): Payer: Self-pay

## 2020-08-08 ENCOUNTER — Other Ambulatory Visit (HOSPITAL_BASED_OUTPATIENT_CLINIC_OR_DEPARTMENT_OTHER): Payer: Self-pay

## 2020-08-12 ENCOUNTER — Other Ambulatory Visit (HOSPITAL_BASED_OUTPATIENT_CLINIC_OR_DEPARTMENT_OTHER): Payer: Self-pay

## 2020-08-13 ENCOUNTER — Other Ambulatory Visit (HOSPITAL_BASED_OUTPATIENT_CLINIC_OR_DEPARTMENT_OTHER): Payer: Self-pay

## 2020-08-14 ENCOUNTER — Other Ambulatory Visit (HOSPITAL_BASED_OUTPATIENT_CLINIC_OR_DEPARTMENT_OTHER): Payer: Self-pay

## 2020-08-17 ENCOUNTER — Other Ambulatory Visit (HOSPITAL_BASED_OUTPATIENT_CLINIC_OR_DEPARTMENT_OTHER): Payer: Self-pay

## 2020-08-19 ENCOUNTER — Other Ambulatory Visit (HOSPITAL_BASED_OUTPATIENT_CLINIC_OR_DEPARTMENT_OTHER): Payer: Self-pay

## 2020-08-19 DIAGNOSIS — I82541 Chronic embolism and thrombosis of right tibial vein: Secondary | ICD-10-CM | POA: Diagnosis not present

## 2020-08-19 DIAGNOSIS — R600 Localized edema: Secondary | ICD-10-CM | POA: Diagnosis not present

## 2020-08-19 DIAGNOSIS — F250 Schizoaffective disorder, bipolar type: Secondary | ICD-10-CM | POA: Diagnosis not present

## 2020-08-19 MED FILL — Clonazepam Tab 0.5 MG: ORAL | 90 days supply | Qty: 45 | Fill #0 | Status: AC

## 2020-08-20 ENCOUNTER — Other Ambulatory Visit (HOSPITAL_BASED_OUTPATIENT_CLINIC_OR_DEPARTMENT_OTHER): Payer: Self-pay

## 2020-08-21 ENCOUNTER — Other Ambulatory Visit (HOSPITAL_BASED_OUTPATIENT_CLINIC_OR_DEPARTMENT_OTHER): Payer: Self-pay

## 2020-08-23 ENCOUNTER — Other Ambulatory Visit (HOSPITAL_BASED_OUTPATIENT_CLINIC_OR_DEPARTMENT_OTHER): Payer: Self-pay

## 2020-08-26 ENCOUNTER — Other Ambulatory Visit (HOSPITAL_BASED_OUTPATIENT_CLINIC_OR_DEPARTMENT_OTHER): Payer: Self-pay

## 2020-08-27 ENCOUNTER — Other Ambulatory Visit (HOSPITAL_BASED_OUTPATIENT_CLINIC_OR_DEPARTMENT_OTHER): Payer: Self-pay

## 2020-08-27 MED ORDER — OMEPRAZOLE 40 MG PO CPDR
DELAYED_RELEASE_CAPSULE | ORAL | 0 refills | Status: DC
  Filled 2020-08-27: qty 90, 90d supply, fill #0

## 2020-08-28 ENCOUNTER — Other Ambulatory Visit (HOSPITAL_BASED_OUTPATIENT_CLINIC_OR_DEPARTMENT_OTHER): Payer: Self-pay

## 2020-09-02 ENCOUNTER — Other Ambulatory Visit (HOSPITAL_BASED_OUTPATIENT_CLINIC_OR_DEPARTMENT_OTHER): Payer: Self-pay

## 2020-09-04 ENCOUNTER — Telehealth: Payer: 59 | Admitting: Physician Assistant

## 2020-09-04 DIAGNOSIS — B9789 Other viral agents as the cause of diseases classified elsewhere: Secondary | ICD-10-CM | POA: Diagnosis not present

## 2020-09-04 DIAGNOSIS — J019 Acute sinusitis, unspecified: Secondary | ICD-10-CM

## 2020-09-05 ENCOUNTER — Other Ambulatory Visit: Payer: Self-pay | Admitting: Podiatry

## 2020-09-05 MED ORDER — FLUTICASONE PROPIONATE 50 MCG/ACT NA SUSP: 2.0000 | Freq: Every day | NASAL | 0 refills | Status: DC

## 2020-09-05 NOTE — Progress Notes (Signed)
We are sorry that you are not feeling well.  Here is how we plan to help!  Based on what you have shared with me it looks like you have sinusitis.  Sinusitis is inflammation and infection in the sinus cavities of the head.  Based on your presentation I believe you most likely have Acute Viral Sinusitis.This is an infection most likely caused by a virus. There is not specific treatment for viral sinusitis other than to help you with the symptoms until the infection runs its course.  You may use an oral decongestant such as Mucinex D or if you have glaucoma or high blood pressure use plain Mucinex. Saline nasal spray help and can safely be used as often as needed for congestion, I have prescribed: Fluticasone nasal spray two sprays in each nostril once a day to help alleviate your sinus pressure/pain and to calm down the drainage which is making your throat sore.   Some authorities believe that zinc sprays or the use of Echinacea may shorten the course of your symptoms.  I do recommend retesting for COVID as you can have false negative results if you test too soon after symptom onset. Giving how you feel so run down with this, I really recommend being retested and quarantining until your results are in.   Sinus infections are not as easily transmitted as other respiratory infection, however we still recommend that you avoid close contact with loved ones, especially the very young and elderly.  Remember to wash your hands thoroughly throughout the day as this is the number one way to prevent the spread of infection!  Home Care:  Only take medications as instructed by your medical team.  Do not take these medications with alcohol.  A steam or ultrasonic humidifier can help congestion.  You can place a towel over your head and breathe in the steam from hot water coming from a faucet.  Avoid close contacts especially the very young and the elderly.  Cover your mouth when you cough or sneeze.  Always  remember to wash your hands.  Get Help Right Away If:  You develop worsening fever or sinus pain.  You develop a severe head ache or visual changes.  Your symptoms persist after you have completed your treatment plan.  Make sure you  Understand these instructions.  Will watch your condition.  Will get help right away if you are not doing well or get worse.  Your e-visit answers were reviewed by a board certified advanced clinical practitioner to complete your personal care plan.  Depending on the condition, your plan could have included both over the counter or prescription medications.  If there is a problem please reply  once you have received a response from your provider.  Your safety is important to [place].  If you have drug allergies check your prescription carefully.    You can use MyChart to ask questions about today's visit, request a non-urgent call back, or ask for a work or school excuse for 24 hours related to this e-Visit. If it has been greater than 24 hours you will need to follow up with your provider, or enter a new e-Visit to address those concerns.  You will get an e-mail in the next two days asking about your experience.  I hope that your e-visit has been valuable and will speed your recovery. Thank you for using e-visits.

## 2020-09-05 NOTE — Progress Notes (Signed)
I have spent 5 minutes in review of e-visit questionnaire, review and updating patient chart, medical decision making and response to patient.   Camiah Humm Cody Dotty Gonzalo, PA-C    

## 2020-09-10 ENCOUNTER — Telehealth: Payer: 59 | Admitting: Physician Assistant

## 2020-09-10 DIAGNOSIS — J208 Acute bronchitis due to other specified organisms: Secondary | ICD-10-CM

## 2020-09-10 DIAGNOSIS — Z79899 Other long term (current) drug therapy: Secondary | ICD-10-CM

## 2020-09-10 DIAGNOSIS — B9689 Other specified bacterial agents as the cause of diseases classified elsewhere: Secondary | ICD-10-CM

## 2020-09-10 DIAGNOSIS — Z889 Allergy status to unspecified drugs, medicaments and biological substances status: Secondary | ICD-10-CM

## 2020-09-10 NOTE — Progress Notes (Signed)
Based on what you shared with me, I feel your condition warrants further evaluation and I recommend that you be seen in a face to face office visit. Giving your medication allergies this limits antibiotic choices. The major issue is that your current medications interact with a lot of medications that you are not allergic to or only have mild intolerances to (nausea, GI upset). As such, you need to reach out to your PCP for evaluation and management of this condition as it is above the scope of what we are allowed to do via e-visit.    NOTE: If you entered your credit card information for this eVisit, you will not be charged. You may see a "hold" on your card for the $35 but that hold will drop off and you will not have a charge processed.   If you are having a true medical emergency please call 911.      For an urgent face to face visit, [facility] has six urgent care centers for your convenience:     [facility] [place] [facility] [place] Get Driving Directions [phone] [address] [place], [place] [address] . 8 am - 4 pm Monday - Friday    [facility]) Get Driving Directions [phone] [address] [place], [place] [address] . 8 am to 8 pm Monday-Friday . 10 am to 6 pm [facility] ([place]) Get Driving Directions [phone]  [address] [place],  [place]  [address] . 8 am to 8 pm Monday-Friday . 8 am to 4 pm [facility] Get Driving Directions [phone] [place], [place], [place] [address] . 8 am to 8 pm Monday-Friday . 8 am to 4 pm [facility] at [facility] Get Driving Directions  [phone] [address] [place], [place] [address] . 8 am to 8 pm Monday-Friday . 8 am to 4 pm [facility]  at [place] Get Driving Directions [phone] [address] [place], [place] [address] . 8 am to 8 pm Monday-Friday . 8 am to 4 pm Saturday-Sunday     Your MyChart E-visit questionnaire answers were reviewed by a board certified advanced clinical practitioner to complete your personal care plan based on your specific symptoms.  Thank you for using e-Visits.

## 2020-09-11 NOTE — Telephone Encounter (Signed)
Please advise 

## 2020-09-12 NOTE — Telephone Encounter (Signed)
No, she has seen a lot of other physicians since she saw me and I;m not writing her for nacotics

## 2020-09-13 ENCOUNTER — Other Ambulatory Visit (HOSPITAL_BASED_OUTPATIENT_CLINIC_OR_DEPARTMENT_OTHER): Payer: Self-pay

## 2020-09-14 ENCOUNTER — Other Ambulatory Visit (HOSPITAL_BASED_OUTPATIENT_CLINIC_OR_DEPARTMENT_OTHER): Payer: Self-pay

## 2020-09-16 ENCOUNTER — Other Ambulatory Visit (HOSPITAL_BASED_OUTPATIENT_CLINIC_OR_DEPARTMENT_OTHER): Payer: Self-pay

## 2020-09-16 DIAGNOSIS — G43109 Migraine with aura, not intractable, without status migrainosus: Secondary | ICD-10-CM | POA: Diagnosis not present

## 2020-09-16 DIAGNOSIS — F429 Obsessive-compulsive disorder, unspecified: Secondary | ICD-10-CM | POA: Diagnosis not present

## 2020-09-16 DIAGNOSIS — F250 Schizoaffective disorder, bipolar type: Secondary | ICD-10-CM | POA: Diagnosis not present

## 2020-09-16 DIAGNOSIS — R000 Tachycardia, unspecified: Secondary | ICD-10-CM | POA: Diagnosis not present

## 2020-09-16 DIAGNOSIS — F17210 Nicotine dependence, cigarettes, uncomplicated: Secondary | ICD-10-CM | POA: Diagnosis not present

## 2020-09-16 DIAGNOSIS — L209 Atopic dermatitis, unspecified: Secondary | ICD-10-CM | POA: Diagnosis not present

## 2020-09-16 DIAGNOSIS — Z6841 Body Mass Index (BMI) 40.0 and over, adult: Secondary | ICD-10-CM | POA: Diagnosis not present

## 2020-09-16 DIAGNOSIS — D72829 Elevated white blood cell count, unspecified: Secondary | ICD-10-CM | POA: Diagnosis not present

## 2020-09-16 DIAGNOSIS — E7849 Other hyperlipidemia: Secondary | ICD-10-CM | POA: Diagnosis not present

## 2020-09-16 MED ORDER — TRIAMCINOLONE ACETONIDE 0.1 % EX CREA
TOPICAL_CREAM | CUTANEOUS | 2 refills | Status: DC
  Filled 2020-09-16: qty 75, 30d supply, fill #0

## 2020-09-16 NOTE — Telephone Encounter (Signed)
Please push the refuse all at the top of this medication request please. Thanks

## 2020-09-17 DIAGNOSIS — E661 Drug-induced obesity: Secondary | ICD-10-CM | POA: Diagnosis not present

## 2020-09-17 DIAGNOSIS — F429 Obsessive-compulsive disorder, unspecified: Secondary | ICD-10-CM | POA: Diagnosis not present

## 2020-09-17 DIAGNOSIS — F410 Panic disorder [episodic paroxysmal anxiety] without agoraphobia: Secondary | ICD-10-CM | POA: Diagnosis not present

## 2020-09-17 DIAGNOSIS — R7303 Prediabetes: Secondary | ICD-10-CM | POA: Diagnosis not present

## 2020-09-17 DIAGNOSIS — F250 Schizoaffective disorder, bipolar type: Secondary | ICD-10-CM | POA: Diagnosis not present

## 2020-09-18 ENCOUNTER — Other Ambulatory Visit (HOSPITAL_BASED_OUTPATIENT_CLINIC_OR_DEPARTMENT_OTHER): Payer: Self-pay

## 2020-09-18 MED FILL — Buspirone HCl Tab 10 MG: ORAL | 90 days supply | Qty: 270 | Fill #0 | Status: AC

## 2020-09-24 ENCOUNTER — Other Ambulatory Visit (HOSPITAL_BASED_OUTPATIENT_CLINIC_OR_DEPARTMENT_OTHER): Payer: Self-pay

## 2020-09-24 MED ORDER — METFORMIN HCL ER 750 MG PO TB24
ORAL_TABLET | ORAL | 1 refills | Status: DC
  Filled 2020-09-24: qty 30, 30d supply, fill #0
  Filled 2020-10-26: qty 30, 30d supply, fill #1

## 2020-09-27 ENCOUNTER — Other Ambulatory Visit (HOSPITAL_BASED_OUTPATIENT_CLINIC_OR_DEPARTMENT_OTHER): Payer: Self-pay

## 2020-09-27 DIAGNOSIS — G4719 Other hypersomnia: Secondary | ICD-10-CM | POA: Diagnosis not present

## 2020-09-27 DIAGNOSIS — F17200 Nicotine dependence, unspecified, uncomplicated: Secondary | ICD-10-CM | POA: Diagnosis not present

## 2020-09-27 DIAGNOSIS — F250 Schizoaffective disorder, bipolar type: Secondary | ICD-10-CM | POA: Diagnosis not present

## 2020-09-27 DIAGNOSIS — G43009 Migraine without aura, not intractable, without status migrainosus: Secondary | ICD-10-CM | POA: Diagnosis not present

## 2020-09-27 DIAGNOSIS — G47411 Narcolepsy with cataplexy: Secondary | ICD-10-CM | POA: Diagnosis not present

## 2020-09-27 DIAGNOSIS — Z6841 Body Mass Index (BMI) 40.0 and over, adult: Secondary | ICD-10-CM | POA: Diagnosis not present

## 2020-09-27 MED ORDER — ARMODAFINIL 50 MG PO TABS
ORAL_TABLET | ORAL | 2 refills | Status: DC
  Filled 2020-09-27: qty 60, 30d supply, fill #0
  Filled 2020-10-26: qty 60, 30d supply, fill #1
  Filled 2020-12-02: qty 60, 30d supply, fill #2

## 2020-09-30 ENCOUNTER — Other Ambulatory Visit (HOSPITAL_BASED_OUTPATIENT_CLINIC_OR_DEPARTMENT_OTHER): Payer: Self-pay

## 2020-10-08 ENCOUNTER — Other Ambulatory Visit (HOSPITAL_BASED_OUTPATIENT_CLINIC_OR_DEPARTMENT_OTHER): Payer: Self-pay

## 2020-10-08 DIAGNOSIS — R0981 Nasal congestion: Secondary | ICD-10-CM | POA: Diagnosis not present

## 2020-10-08 DIAGNOSIS — H6983 Other specified disorders of Eustachian tube, bilateral: Secondary | ICD-10-CM | POA: Diagnosis not present

## 2020-10-08 MED ORDER — FLUTICASONE PROPIONATE 50 MCG/ACT NA SUSP
NASAL | 2 refills | Status: DC
  Filled 2020-10-08: qty 16, 60d supply, fill #0

## 2020-10-11 ENCOUNTER — Other Ambulatory Visit (HOSPITAL_BASED_OUTPATIENT_CLINIC_OR_DEPARTMENT_OTHER): Payer: Self-pay

## 2020-10-11 MED ORDER — INVEGA SUSTENNA 156 MG/ML IM SUSY
PREFILLED_SYRINGE | INTRAMUSCULAR | 2 refills | Status: DC
  Filled 2020-10-11: qty 1, 30d supply, fill #0
  Filled 2021-09-15: qty 1, 30d supply, fill #1

## 2020-10-12 ENCOUNTER — Other Ambulatory Visit (HOSPITAL_BASED_OUTPATIENT_CLINIC_OR_DEPARTMENT_OTHER): Payer: Self-pay

## 2020-10-15 ENCOUNTER — Other Ambulatory Visit (HOSPITAL_BASED_OUTPATIENT_CLINIC_OR_DEPARTMENT_OTHER): Payer: Self-pay

## 2020-10-16 ENCOUNTER — Other Ambulatory Visit (HOSPITAL_BASED_OUTPATIENT_CLINIC_OR_DEPARTMENT_OTHER): Payer: Self-pay

## 2020-10-16 MED ORDER — CARVEDILOL 3.125 MG PO TABS
3.1250 mg | ORAL_TABLET | Freq: Two times a day (BID) | ORAL | 1 refills | Status: DC
  Filled 2020-10-16: qty 180, 90d supply, fill #0
  Filled 2021-01-13: qty 180, 90d supply, fill #1

## 2020-10-17 ENCOUNTER — Other Ambulatory Visit (HOSPITAL_BASED_OUTPATIENT_CLINIC_OR_DEPARTMENT_OTHER): Payer: Self-pay

## 2020-10-17 DIAGNOSIS — F250 Schizoaffective disorder, bipolar type: Secondary | ICD-10-CM | POA: Diagnosis not present

## 2020-10-17 MED ORDER — CITALOPRAM HYDROBROMIDE 20 MG PO TABS
ORAL_TABLET | ORAL | 0 refills | Status: DC
  Filled 2020-10-17: qty 90, 90d supply, fill #0

## 2020-10-18 ENCOUNTER — Other Ambulatory Visit (HOSPITAL_BASED_OUTPATIENT_CLINIC_OR_DEPARTMENT_OTHER): Payer: Self-pay

## 2020-10-18 DIAGNOSIS — F429 Obsessive-compulsive disorder, unspecified: Secondary | ICD-10-CM | POA: Diagnosis not present

## 2020-10-18 DIAGNOSIS — F250 Schizoaffective disorder, bipolar type: Secondary | ICD-10-CM | POA: Diagnosis not present

## 2020-10-18 DIAGNOSIS — F4310 Post-traumatic stress disorder, unspecified: Secondary | ICD-10-CM | POA: Diagnosis not present

## 2020-10-18 DIAGNOSIS — Z1339 Encounter for screening examination for other mental health and behavioral disorders: Secondary | ICD-10-CM | POA: Diagnosis not present

## 2020-10-18 DIAGNOSIS — H6983 Other specified disorders of Eustachian tube, bilateral: Secondary | ICD-10-CM | POA: Diagnosis not present

## 2020-10-18 MED ORDER — PREDNISONE 20 MG PO TABS
ORAL_TABLET | ORAL | 0 refills | Status: DC
  Filled 2020-10-18: qty 10, 5d supply, fill #0

## 2020-10-26 ENCOUNTER — Other Ambulatory Visit (HOSPITAL_BASED_OUTPATIENT_CLINIC_OR_DEPARTMENT_OTHER): Payer: Self-pay

## 2020-10-29 ENCOUNTER — Other Ambulatory Visit (HOSPITAL_BASED_OUTPATIENT_CLINIC_OR_DEPARTMENT_OTHER): Payer: Self-pay

## 2020-10-30 ENCOUNTER — Other Ambulatory Visit (HOSPITAL_BASED_OUTPATIENT_CLINIC_OR_DEPARTMENT_OTHER): Payer: Self-pay

## 2020-10-30 MED ORDER — TOPIRAMATE 100 MG PO TABS
100.0000 mg | ORAL_TABLET | Freq: Every day | ORAL | 1 refills | Status: DC
  Filled 2020-10-30 – 2021-05-13 (×2): qty 90, 90d supply, fill #0
  Filled 2021-08-19: qty 90, 90d supply, fill #1

## 2020-10-30 MED ORDER — TOPIRAMATE 100 MG PO TABS
100.0000 mg | ORAL_TABLET | Freq: Every day | ORAL | 1 refills | Status: DC
  Filled 2020-10-30: qty 90, 90d supply, fill #0
  Filled 2021-02-02: qty 90, 90d supply, fill #1

## 2020-10-31 ENCOUNTER — Other Ambulatory Visit (HOSPITAL_BASED_OUTPATIENT_CLINIC_OR_DEPARTMENT_OTHER): Payer: Self-pay

## 2020-11-02 ENCOUNTER — Other Ambulatory Visit (HOSPITAL_BASED_OUTPATIENT_CLINIC_OR_DEPARTMENT_OTHER): Payer: Self-pay

## 2020-11-04 ENCOUNTER — Other Ambulatory Visit (HOSPITAL_BASED_OUTPATIENT_CLINIC_OR_DEPARTMENT_OTHER): Payer: Self-pay

## 2020-11-05 ENCOUNTER — Other Ambulatory Visit (HOSPITAL_BASED_OUTPATIENT_CLINIC_OR_DEPARTMENT_OTHER): Payer: Self-pay

## 2020-11-05 MED ORDER — OXCARBAZEPINE 600 MG PO TABS
ORAL_TABLET | ORAL | 0 refills | Status: DC
  Filled 2020-11-05 – 2020-11-13 (×2): qty 90, 90d supply, fill #0

## 2020-11-06 ENCOUNTER — Other Ambulatory Visit (HOSPITAL_BASED_OUTPATIENT_CLINIC_OR_DEPARTMENT_OTHER): Payer: Self-pay

## 2020-11-08 ENCOUNTER — Other Ambulatory Visit (HOSPITAL_BASED_OUTPATIENT_CLINIC_OR_DEPARTMENT_OTHER): Payer: Self-pay

## 2020-11-08 DIAGNOSIS — F250 Schizoaffective disorder, bipolar type: Secondary | ICD-10-CM | POA: Diagnosis not present

## 2020-11-08 DIAGNOSIS — F4310 Post-traumatic stress disorder, unspecified: Secondary | ICD-10-CM | POA: Diagnosis not present

## 2020-11-08 DIAGNOSIS — R7303 Prediabetes: Secondary | ICD-10-CM | POA: Diagnosis not present

## 2020-11-08 DIAGNOSIS — E661 Drug-induced obesity: Secondary | ICD-10-CM | POA: Diagnosis not present

## 2020-11-08 DIAGNOSIS — F429 Obsessive-compulsive disorder, unspecified: Secondary | ICD-10-CM | POA: Diagnosis not present

## 2020-11-08 DIAGNOSIS — F410 Panic disorder [episodic paroxysmal anxiety] without agoraphobia: Secondary | ICD-10-CM | POA: Diagnosis not present

## 2020-11-08 MED ORDER — BUSPIRONE HCL 10 MG PO TABS
ORAL_TABLET | ORAL | 0 refills | Status: DC
  Filled 2020-11-08 – 2020-11-13 (×2): qty 270, 90d supply, fill #0

## 2020-11-08 MED ORDER — HYDROXYZINE HCL 50 MG PO TABS
ORAL_TABLET | ORAL | 0 refills | Status: DC
  Filled 2020-11-08: qty 90, 90d supply, fill #0

## 2020-11-08 MED ORDER — INVEGA TRINZA 546 MG/1.75ML IM SUSY
PREFILLED_SYRINGE | INTRAMUSCULAR | 0 refills | Status: DC
  Filled 2020-11-08: qty 1.75, 30d supply, fill #0

## 2020-11-08 MED ORDER — METFORMIN HCL ER 750 MG PO TB24
ORAL_TABLET | ORAL | 2 refills | Status: DC
  Filled 2020-11-26: qty 30, 30d supply, fill #0
  Filled 2021-01-05: qty 30, 30d supply, fill #1
  Filled 2021-02-02: qty 30, 30d supply, fill #2

## 2020-11-08 MED ORDER — CLONAZEPAM 0.5 MG PO TABS
ORAL_TABLET | ORAL | 0 refills | Status: DC
  Filled 2020-11-26: qty 45, 90d supply, fill #0

## 2020-11-11 ENCOUNTER — Other Ambulatory Visit (HOSPITAL_BASED_OUTPATIENT_CLINIC_OR_DEPARTMENT_OTHER): Payer: Self-pay

## 2020-11-13 ENCOUNTER — Other Ambulatory Visit (HOSPITAL_BASED_OUTPATIENT_CLINIC_OR_DEPARTMENT_OTHER): Payer: Self-pay

## 2020-11-15 DIAGNOSIS — F250 Schizoaffective disorder, bipolar type: Secondary | ICD-10-CM | POA: Diagnosis not present

## 2020-11-26 ENCOUNTER — Other Ambulatory Visit (HOSPITAL_BASED_OUTPATIENT_CLINIC_OR_DEPARTMENT_OTHER): Payer: Self-pay

## 2020-11-26 MED ORDER — OMEPRAZOLE 40 MG PO CPDR
40.0000 mg | DELAYED_RELEASE_CAPSULE | Freq: Every day | ORAL | 0 refills | Status: DC
  Filled 2020-11-26: qty 90, 90d supply, fill #0

## 2020-12-02 ENCOUNTER — Other Ambulatory Visit (HOSPITAL_BASED_OUTPATIENT_CLINIC_OR_DEPARTMENT_OTHER): Payer: Self-pay

## 2020-12-17 DIAGNOSIS — F17210 Nicotine dependence, cigarettes, uncomplicated: Secondary | ICD-10-CM | POA: Diagnosis not present

## 2020-12-17 DIAGNOSIS — U071 COVID-19: Secondary | ICD-10-CM | POA: Diagnosis not present

## 2020-12-17 DIAGNOSIS — Z6836 Body mass index (BMI) 36.0-36.9, adult: Secondary | ICD-10-CM | POA: Diagnosis not present

## 2020-12-18 ENCOUNTER — Other Ambulatory Visit (HOSPITAL_BASED_OUTPATIENT_CLINIC_OR_DEPARTMENT_OTHER): Payer: Self-pay

## 2020-12-18 MED ORDER — ALBUTEROL SULFATE HFA 108 (90 BASE) MCG/ACT IN AERS
2.0000 | INHALATION_SPRAY | RESPIRATORY_TRACT | 0 refills | Status: AC | PRN
  Filled 2020-12-18: qty 8.5, 16d supply, fill #0

## 2020-12-18 MED ORDER — FLUTICASONE PROPIONATE 50 MCG/ACT NA SUSP
1.0000 | Freq: Every day | NASAL | 2 refills | Status: DC
  Filled 2020-12-18: qty 16, 60d supply, fill #0
  Filled 2021-03-05: qty 16, 60d supply, fill #1

## 2021-01-06 ENCOUNTER — Other Ambulatory Visit (HOSPITAL_BASED_OUTPATIENT_CLINIC_OR_DEPARTMENT_OTHER): Payer: Self-pay

## 2021-01-07 ENCOUNTER — Other Ambulatory Visit (HOSPITAL_BASED_OUTPATIENT_CLINIC_OR_DEPARTMENT_OTHER): Payer: Self-pay

## 2021-01-07 MED ORDER — ARMODAFINIL 150 MG PO TABS
ORAL_TABLET | ORAL | 5 refills | Status: DC
  Filled 2021-01-07: qty 30, 30d supply, fill #0
  Filled 2021-02-09: qty 30, 30d supply, fill #1
  Filled 2021-03-09: qty 30, 30d supply, fill #2
  Filled 2021-04-06: qty 30, 30d supply, fill #3
  Filled 2021-05-04 – 2021-05-06 (×2): qty 30, 30d supply, fill #4
  Filled 2021-06-12: qty 30, 30d supply, fill #5

## 2021-01-13 ENCOUNTER — Other Ambulatory Visit (HOSPITAL_BASED_OUTPATIENT_CLINIC_OR_DEPARTMENT_OTHER): Payer: Self-pay

## 2021-01-14 ENCOUNTER — Other Ambulatory Visit (HOSPITAL_BASED_OUTPATIENT_CLINIC_OR_DEPARTMENT_OTHER): Payer: Self-pay

## 2021-01-14 MED ORDER — CITALOPRAM HYDROBROMIDE 20 MG PO TABS
ORAL_TABLET | ORAL | 0 refills | Status: DC
  Filled 2021-01-14: qty 90, 90d supply, fill #0

## 2021-01-28 DIAGNOSIS — G4719 Other hypersomnia: Secondary | ICD-10-CM | POA: Diagnosis not present

## 2021-01-28 DIAGNOSIS — G47411 Narcolepsy with cataplexy: Secondary | ICD-10-CM | POA: Diagnosis not present

## 2021-01-29 ENCOUNTER — Other Ambulatory Visit (HOSPITAL_BASED_OUTPATIENT_CLINIC_OR_DEPARTMENT_OTHER): Payer: Self-pay

## 2021-01-31 DIAGNOSIS — Z789 Other specified health status: Secondary | ICD-10-CM | POA: Diagnosis not present

## 2021-01-31 DIAGNOSIS — E221 Hyperprolactinemia: Secondary | ICD-10-CM | POA: Diagnosis not present

## 2021-01-31 DIAGNOSIS — F250 Schizoaffective disorder, bipolar type: Secondary | ICD-10-CM | POA: Diagnosis not present

## 2021-02-03 ENCOUNTER — Other Ambulatory Visit (HOSPITAL_BASED_OUTPATIENT_CLINIC_OR_DEPARTMENT_OTHER): Payer: Self-pay

## 2021-02-03 DIAGNOSIS — Z87898 Personal history of other specified conditions: Secondary | ICD-10-CM | POA: Diagnosis not present

## 2021-02-03 DIAGNOSIS — Z23 Encounter for immunization: Secondary | ICD-10-CM | POA: Diagnosis not present

## 2021-02-03 DIAGNOSIS — Z6841 Body Mass Index (BMI) 40.0 and over, adult: Secondary | ICD-10-CM | POA: Diagnosis not present

## 2021-02-03 DIAGNOSIS — Z6838 Body mass index (BMI) 38.0-38.9, adult: Secondary | ICD-10-CM | POA: Diagnosis not present

## 2021-02-03 DIAGNOSIS — G43109 Migraine with aura, not intractable, without status migrainosus: Secondary | ICD-10-CM | POA: Diagnosis not present

## 2021-02-03 DIAGNOSIS — F17210 Nicotine dependence, cigarettes, uncomplicated: Secondary | ICD-10-CM | POA: Diagnosis not present

## 2021-02-03 DIAGNOSIS — I82541 Chronic embolism and thrombosis of right tibial vein: Secondary | ICD-10-CM | POA: Diagnosis not present

## 2021-02-03 DIAGNOSIS — R300 Dysuria: Secondary | ICD-10-CM | POA: Diagnosis not present

## 2021-02-03 DIAGNOSIS — E7849 Other hyperlipidemia: Secondary | ICD-10-CM | POA: Diagnosis not present

## 2021-02-09 ENCOUNTER — Other Ambulatory Visit (HOSPITAL_BASED_OUTPATIENT_CLINIC_OR_DEPARTMENT_OTHER): Payer: Self-pay

## 2021-02-10 ENCOUNTER — Other Ambulatory Visit (HOSPITAL_BASED_OUTPATIENT_CLINIC_OR_DEPARTMENT_OTHER): Payer: Self-pay

## 2021-02-10 MED ORDER — FENOFIBRATE 160 MG PO TABS
ORAL_TABLET | ORAL | 1 refills | Status: DC
  Filled 2021-02-10: qty 90, 90d supply, fill #0
  Filled 2021-05-04: qty 90, 90d supply, fill #1

## 2021-02-10 MED ORDER — INVEGA TRINZA 546 MG/1.75ML IM SUSY
PREFILLED_SYRINGE | INTRAMUSCULAR | 1 refills | Status: DC
  Filled 2021-02-10: qty 1.75, 30d supply, fill #0

## 2021-02-11 ENCOUNTER — Other Ambulatory Visit (HOSPITAL_BASED_OUTPATIENT_CLINIC_OR_DEPARTMENT_OTHER): Payer: Self-pay

## 2021-02-11 MED ORDER — OXCARBAZEPINE 600 MG PO TABS
ORAL_TABLET | ORAL | 0 refills | Status: AC
  Filled 2021-02-11: qty 90, 90d supply, fill #0

## 2021-02-12 ENCOUNTER — Other Ambulatory Visit (HOSPITAL_BASED_OUTPATIENT_CLINIC_OR_DEPARTMENT_OTHER): Payer: Self-pay

## 2021-02-14 ENCOUNTER — Other Ambulatory Visit (HOSPITAL_BASED_OUTPATIENT_CLINIC_OR_DEPARTMENT_OTHER): Payer: Self-pay

## 2021-02-14 DIAGNOSIS — F250 Schizoaffective disorder, bipolar type: Secondary | ICD-10-CM | POA: Diagnosis not present

## 2021-02-14 DIAGNOSIS — F410 Panic disorder [episodic paroxysmal anxiety] without agoraphobia: Secondary | ICD-10-CM | POA: Diagnosis not present

## 2021-02-14 DIAGNOSIS — G47419 Narcolepsy without cataplexy: Secondary | ICD-10-CM | POA: Diagnosis not present

## 2021-02-14 DIAGNOSIS — R41840 Attention and concentration deficit: Secondary | ICD-10-CM | POA: Diagnosis not present

## 2021-02-14 DIAGNOSIS — R7303 Prediabetes: Secondary | ICD-10-CM | POA: Diagnosis not present

## 2021-02-14 DIAGNOSIS — F429 Obsessive-compulsive disorder, unspecified: Secondary | ICD-10-CM | POA: Diagnosis not present

## 2021-02-14 DIAGNOSIS — Z79899 Other long term (current) drug therapy: Secondary | ICD-10-CM | POA: Diagnosis not present

## 2021-02-14 DIAGNOSIS — E661 Drug-induced obesity: Secondary | ICD-10-CM | POA: Diagnosis not present

## 2021-02-14 MED ORDER — BUSPIRONE HCL 10 MG PO TABS
10.0000 mg | ORAL_TABLET | Freq: Three times a day (TID) | ORAL | 0 refills | Status: DC
  Filled 2021-02-14: qty 270, 90d supply, fill #0

## 2021-02-14 MED ORDER — METFORMIN HCL ER 750 MG PO TB24
750.0000 mg | ORAL_TABLET | Freq: Every day | ORAL | 2 refills | Status: DC
  Filled 2021-02-14 – 2021-03-09 (×2): qty 30, 30d supply, fill #0
  Filled 2021-04-06: qty 30, 30d supply, fill #1
  Filled 2021-05-04: qty 30, 30d supply, fill #2

## 2021-02-14 MED ORDER — HYDROXYZINE HCL 50 MG PO TABS
50.0000 mg | ORAL_TABLET | Freq: Every day | ORAL | 0 refills | Status: DC
  Filled 2021-02-14: qty 90, 90d supply, fill #0

## 2021-02-14 MED ORDER — CLONAZEPAM 0.5 MG PO TABS
0.2500 mg | ORAL_TABLET | Freq: Every day | ORAL | 0 refills | Status: DC
  Filled 2021-02-14 – 2021-02-24 (×2): qty 45, 90d supply, fill #0

## 2021-02-24 ENCOUNTER — Other Ambulatory Visit (HOSPITAL_BASED_OUTPATIENT_CLINIC_OR_DEPARTMENT_OTHER): Payer: Self-pay

## 2021-03-02 ENCOUNTER — Other Ambulatory Visit (HOSPITAL_BASED_OUTPATIENT_CLINIC_OR_DEPARTMENT_OTHER): Payer: Self-pay

## 2021-03-03 ENCOUNTER — Other Ambulatory Visit (HOSPITAL_BASED_OUTPATIENT_CLINIC_OR_DEPARTMENT_OTHER): Payer: Self-pay

## 2021-03-03 MED ORDER — OMEPRAZOLE 40 MG PO CPDR
40.0000 mg | DELAYED_RELEASE_CAPSULE | Freq: Every day | ORAL | 0 refills | Status: DC
  Filled 2021-03-03: qty 90, 90d supply, fill #0

## 2021-03-05 ENCOUNTER — Other Ambulatory Visit (HOSPITAL_BASED_OUTPATIENT_CLINIC_OR_DEPARTMENT_OTHER): Payer: Self-pay

## 2021-03-05 DIAGNOSIS — B9789 Other viral agents as the cause of diseases classified elsewhere: Secondary | ICD-10-CM | POA: Diagnosis not present

## 2021-03-05 DIAGNOSIS — J329 Chronic sinusitis, unspecified: Secondary | ICD-10-CM | POA: Diagnosis not present

## 2021-03-05 MED ORDER — PROMETHAZINE-DM 6.25-15 MG/5ML PO SYRP
ORAL_SOLUTION | ORAL | 0 refills | Status: DC
  Filled 2021-03-05: qty 118, 6d supply, fill #0

## 2021-03-10 ENCOUNTER — Other Ambulatory Visit (HOSPITAL_BASED_OUTPATIENT_CLINIC_OR_DEPARTMENT_OTHER): Payer: Self-pay

## 2021-03-24 DIAGNOSIS — R41840 Attention and concentration deficit: Secondary | ICD-10-CM | POA: Diagnosis not present

## 2021-03-24 DIAGNOSIS — F410 Panic disorder [episodic paroxysmal anxiety] without agoraphobia: Secondary | ICD-10-CM | POA: Diagnosis not present

## 2021-04-04 ENCOUNTER — Other Ambulatory Visit (HOSPITAL_BASED_OUTPATIENT_CLINIC_OR_DEPARTMENT_OTHER): Payer: Self-pay

## 2021-04-08 ENCOUNTER — Other Ambulatory Visit (HOSPITAL_BASED_OUTPATIENT_CLINIC_OR_DEPARTMENT_OTHER): Payer: Self-pay

## 2021-04-13 ENCOUNTER — Other Ambulatory Visit (HOSPITAL_BASED_OUTPATIENT_CLINIC_OR_DEPARTMENT_OTHER): Payer: Self-pay

## 2021-04-14 ENCOUNTER — Other Ambulatory Visit (HOSPITAL_BASED_OUTPATIENT_CLINIC_OR_DEPARTMENT_OTHER): Payer: Self-pay

## 2021-04-16 ENCOUNTER — Other Ambulatory Visit (HOSPITAL_BASED_OUTPATIENT_CLINIC_OR_DEPARTMENT_OTHER): Payer: Self-pay

## 2021-04-16 MED ORDER — CITALOPRAM HYDROBROMIDE 20 MG PO TABS
ORAL_TABLET | ORAL | 0 refills | Status: DC
  Filled 2021-04-16: qty 90, 90d supply, fill #0

## 2021-04-23 ENCOUNTER — Other Ambulatory Visit (HOSPITAL_BASED_OUTPATIENT_CLINIC_OR_DEPARTMENT_OTHER): Payer: Self-pay

## 2021-04-23 MED ORDER — CARVEDILOL 3.125 MG PO TABS
3.1250 mg | ORAL_TABLET | Freq: Two times a day (BID) | ORAL | 1 refills | Status: DC
  Filled 2021-04-23: qty 180, 90d supply, fill #0
  Filled 2021-07-14: qty 180, 90d supply, fill #1

## 2021-04-23 MED ORDER — ATORVASTATIN CALCIUM 10 MG PO TABS
ORAL_TABLET | ORAL | 1 refills | Status: DC
  Filled 2021-04-23: qty 90, 90d supply, fill #0
  Filled 2021-07-14: qty 90, 90d supply, fill #1

## 2021-04-25 ENCOUNTER — Other Ambulatory Visit (HOSPITAL_BASED_OUTPATIENT_CLINIC_OR_DEPARTMENT_OTHER): Payer: Self-pay

## 2021-04-25 DIAGNOSIS — G47419 Narcolepsy without cataplexy: Secondary | ICD-10-CM | POA: Diagnosis not present

## 2021-04-25 DIAGNOSIS — F4310 Post-traumatic stress disorder, unspecified: Secondary | ICD-10-CM | POA: Diagnosis not present

## 2021-04-25 DIAGNOSIS — F250 Schizoaffective disorder, bipolar type: Secondary | ICD-10-CM | POA: Diagnosis not present

## 2021-04-25 DIAGNOSIS — F429 Obsessive-compulsive disorder, unspecified: Secondary | ICD-10-CM | POA: Diagnosis not present

## 2021-04-25 DIAGNOSIS — F410 Panic disorder [episodic paroxysmal anxiety] without agoraphobia: Secondary | ICD-10-CM | POA: Diagnosis not present

## 2021-04-25 DIAGNOSIS — F902 Attention-deficit hyperactivity disorder, combined type: Secondary | ICD-10-CM | POA: Diagnosis not present

## 2021-04-25 MED ORDER — QELBREE 200 MG PO CP24
400.0000 mg | ORAL_CAPSULE | Freq: Every day | ORAL | 1 refills | Status: DC
  Filled 2021-04-25: qty 60, 30d supply, fill #0
  Filled 2021-05-28: qty 60, 30d supply, fill #1

## 2021-04-28 ENCOUNTER — Other Ambulatory Visit (HOSPITAL_BASED_OUTPATIENT_CLINIC_OR_DEPARTMENT_OTHER): Payer: Self-pay

## 2021-04-29 ENCOUNTER — Other Ambulatory Visit (HOSPITAL_BASED_OUTPATIENT_CLINIC_OR_DEPARTMENT_OTHER): Payer: Self-pay

## 2021-05-01 ENCOUNTER — Telehealth: Payer: 59 | Admitting: Family Medicine

## 2021-05-01 DIAGNOSIS — R0981 Nasal congestion: Secondary | ICD-10-CM

## 2021-05-01 DIAGNOSIS — Z889 Allergy status to unspecified drugs, medicaments and biological substances status: Secondary | ICD-10-CM

## 2021-05-01 DIAGNOSIS — Z79899 Other long term (current) drug therapy: Secondary | ICD-10-CM

## 2021-05-01 DIAGNOSIS — F4310 Post-traumatic stress disorder, unspecified: Secondary | ICD-10-CM | POA: Diagnosis not present

## 2021-05-01 NOTE — Progress Notes (Signed)
[place]  Multiple drug allergies and several interactions with high risk when provider tried to two different medications. Due to this she is advised to see her PCP that knows her well and or go be seen in person for better clarification.

## 2021-05-02 ENCOUNTER — Other Ambulatory Visit (HOSPITAL_BASED_OUTPATIENT_CLINIC_OR_DEPARTMENT_OTHER): Payer: Self-pay

## 2021-05-02 DIAGNOSIS — B349 Viral infection, unspecified: Secondary | ICD-10-CM | POA: Diagnosis not present

## 2021-05-02 DIAGNOSIS — Z20822 Contact with and (suspected) exposure to covid-19: Secondary | ICD-10-CM | POA: Diagnosis not present

## 2021-05-02 MED ORDER — CHLORHEXIDINE GLUCONATE 0.12 % MT SOLN
OROMUCOSAL | 0 refills | Status: DC
  Filled 2021-05-02: qty 473, 16d supply, fill #0

## 2021-05-04 ENCOUNTER — Other Ambulatory Visit (HOSPITAL_BASED_OUTPATIENT_CLINIC_OR_DEPARTMENT_OTHER): Payer: Self-pay

## 2021-05-05 ENCOUNTER — Other Ambulatory Visit (HOSPITAL_BASED_OUTPATIENT_CLINIC_OR_DEPARTMENT_OTHER): Payer: Self-pay

## 2021-05-05 MED ORDER — OMEPRAZOLE 40 MG PO CPDR
40.0000 mg | DELAYED_RELEASE_CAPSULE | Freq: Every day | ORAL | 0 refills | Status: DC
  Filled 2021-05-05 – 2021-05-13 (×2): qty 90, 90d supply, fill #0

## 2021-05-06 ENCOUNTER — Other Ambulatory Visit (HOSPITAL_BASED_OUTPATIENT_CLINIC_OR_DEPARTMENT_OTHER): Payer: Self-pay

## 2021-05-12 ENCOUNTER — Other Ambulatory Visit (HOSPITAL_BASED_OUTPATIENT_CLINIC_OR_DEPARTMENT_OTHER): Payer: Self-pay

## 2021-05-12 MED ORDER — INGREZZA 80 MG PO CAPS
80.0000 mg | ORAL_CAPSULE | Freq: Every day | ORAL | 3 refills | Status: AC
  Filled 2021-05-12: qty 30, 30d supply, fill #0
  Filled 2021-06-12 (×2): qty 30, 30d supply, fill #1
  Filled 2021-07-14: qty 30, 30d supply, fill #2
  Filled 2021-08-19: qty 30, 30d supply, fill #3
  Filled 2021-10-01: qty 30, 30d supply, fill #4
  Filled 2021-10-31: qty 30, 30d supply, fill #5
  Filled 2021-11-26: qty 30, 30d supply, fill #6
  Filled 2021-12-31: qty 30, 30d supply, fill #7
  Filled 2022-01-28: qty 30, 30d supply, fill #8
  Filled 2022-03-04: qty 30, 30d supply, fill #9
  Filled 2022-04-01: qty 30, 30d supply, fill #10

## 2021-05-12 MED ORDER — INVEGA TRINZA 546 MG/1.75ML IM SUSY
PREFILLED_SYRINGE | INTRAMUSCULAR | 1 refills | Status: DC
  Filled 2021-05-12: qty 1.75, 30d supply, fill #0

## 2021-05-13 ENCOUNTER — Other Ambulatory Visit (HOSPITAL_BASED_OUTPATIENT_CLINIC_OR_DEPARTMENT_OTHER): Payer: Self-pay

## 2021-05-13 MED FILL — Oxcarbazepine Tab 600 MG: ORAL | 30 days supply | Qty: 30 | Fill #0 | Status: AC

## 2021-05-14 ENCOUNTER — Other Ambulatory Visit (HOSPITAL_BASED_OUTPATIENT_CLINIC_OR_DEPARTMENT_OTHER): Payer: Self-pay

## 2021-05-14 DIAGNOSIS — E785 Hyperlipidemia, unspecified: Secondary | ICD-10-CM | POA: Diagnosis not present

## 2021-05-14 DIAGNOSIS — Z881 Allergy status to other antibiotic agents status: Secondary | ICD-10-CM | POA: Diagnosis not present

## 2021-05-14 DIAGNOSIS — S199XXA Unspecified injury of neck, initial encounter: Secondary | ICD-10-CM | POA: Diagnosis not present

## 2021-05-14 DIAGNOSIS — F419 Anxiety disorder, unspecified: Secondary | ICD-10-CM | POA: Diagnosis not present

## 2021-05-14 DIAGNOSIS — Z882 Allergy status to sulfonamides status: Secondary | ICD-10-CM | POA: Diagnosis not present

## 2021-05-14 DIAGNOSIS — Z87891 Personal history of nicotine dependence: Secondary | ICD-10-CM | POA: Diagnosis not present

## 2021-05-14 DIAGNOSIS — S161XXA Strain of muscle, fascia and tendon at neck level, initial encounter: Secondary | ICD-10-CM | POA: Diagnosis not present

## 2021-05-14 DIAGNOSIS — Z880 Allergy status to penicillin: Secondary | ICD-10-CM | POA: Diagnosis not present

## 2021-05-14 DIAGNOSIS — F319 Bipolar disorder, unspecified: Secondary | ICD-10-CM | POA: Diagnosis not present

## 2021-05-14 DIAGNOSIS — Z888 Allergy status to other drugs, medicaments and biological substances status: Secondary | ICD-10-CM | POA: Diagnosis not present

## 2021-05-14 MED ORDER — CYCLOBENZAPRINE HCL 10 MG PO TABS
ORAL_TABLET | ORAL | 0 refills | Status: DC
  Filled 2021-05-14: qty 20, 10d supply, fill #0

## 2021-05-16 DIAGNOSIS — F250 Schizoaffective disorder, bipolar type: Secondary | ICD-10-CM | POA: Diagnosis not present

## 2021-05-20 ENCOUNTER — Other Ambulatory Visit (HOSPITAL_BASED_OUTPATIENT_CLINIC_OR_DEPARTMENT_OTHER): Payer: Self-pay

## 2021-05-20 DIAGNOSIS — M542 Cervicalgia: Secondary | ICD-10-CM | POA: Diagnosis not present

## 2021-05-20 DIAGNOSIS — M4602 Spinal enthesopathy, cervical region: Secondary | ICD-10-CM | POA: Diagnosis not present

## 2021-05-20 DIAGNOSIS — M50321 Other cervical disc degeneration at C4-C5 level: Secondary | ICD-10-CM | POA: Diagnosis not present

## 2021-05-20 DIAGNOSIS — M25512 Pain in left shoulder: Secondary | ICD-10-CM | POA: Diagnosis not present

## 2021-05-20 MED ORDER — MELOXICAM 15 MG PO TABS
ORAL_TABLET | ORAL | 2 refills | Status: DC
  Filled 2021-05-20: qty 30, 30d supply, fill #0
  Filled 2021-07-08: qty 30, 30d supply, fill #1
  Filled 2021-09-15: qty 30, 30d supply, fill #2

## 2021-05-20 MED ORDER — BACLOFEN 10 MG PO TABS
ORAL_TABLET | ORAL | 1 refills | Status: DC
  Filled 2021-05-20: qty 30, 10d supply, fill #0
  Filled 2021-07-08: qty 30, 10d supply, fill #1

## 2021-05-20 MED ORDER — GABAPENTIN 100 MG PO CAPS
ORAL_CAPSULE | ORAL | 0 refills | Status: DC
  Filled 2021-05-20: qty 90, 30d supply, fill #0

## 2021-05-21 ENCOUNTER — Other Ambulatory Visit (HOSPITAL_BASED_OUTPATIENT_CLINIC_OR_DEPARTMENT_OTHER): Payer: Self-pay

## 2021-05-21 DIAGNOSIS — G43009 Migraine without aura, not intractable, without status migrainosus: Secondary | ICD-10-CM | POA: Diagnosis not present

## 2021-05-21 DIAGNOSIS — F429 Obsessive-compulsive disorder, unspecified: Secondary | ICD-10-CM | POA: Diagnosis not present

## 2021-05-21 DIAGNOSIS — F902 Attention-deficit hyperactivity disorder, combined type: Secondary | ICD-10-CM | POA: Diagnosis not present

## 2021-05-21 DIAGNOSIS — F4310 Post-traumatic stress disorder, unspecified: Secondary | ICD-10-CM | POA: Diagnosis not present

## 2021-05-21 DIAGNOSIS — F250 Schizoaffective disorder, bipolar type: Secondary | ICD-10-CM | POA: Diagnosis not present

## 2021-05-21 DIAGNOSIS — F17200 Nicotine dependence, unspecified, uncomplicated: Secondary | ICD-10-CM | POA: Diagnosis not present

## 2021-05-21 MED ORDER — SUMATRIPTAN SUCCINATE 100 MG PO TABS
ORAL_TABLET | ORAL | 5 refills | Status: DC
  Filled 2021-05-21: qty 9, 30d supply, fill #0
  Filled 2021-07-08: qty 9, 30d supply, fill #1
  Filled 2021-09-15: qty 9, 30d supply, fill #2

## 2021-05-21 MED ORDER — HYDROXYZINE HCL 50 MG PO TABS
ORAL_TABLET | ORAL | 0 refills | Status: DC
  Filled 2021-05-21: qty 90, 90d supply, fill #0

## 2021-05-21 MED ORDER — PRAZOSIN HCL 2 MG PO CAPS
ORAL_CAPSULE | ORAL | 1 refills | Status: DC
  Filled 2021-05-21: qty 30, 30d supply, fill #0

## 2021-05-21 MED ORDER — OXCARBAZEPINE 600 MG PO TABS
ORAL_TABLET | ORAL | 0 refills | Status: DC
  Filled 2021-06-23: qty 90, 90d supply, fill #0

## 2021-05-21 MED ORDER — TOPIRAMATE 50 MG PO TABS
ORAL_TABLET | ORAL | 5 refills | Status: DC
  Filled 2021-05-21: qty 90, 30d supply, fill #0
  Filled 2021-06-23: qty 90, 30d supply, fill #1
  Filled 2021-07-22: qty 90, 30d supply, fill #2
  Filled 2021-08-25: qty 90, 30d supply, fill #3

## 2021-05-21 MED ORDER — METFORMIN HCL ER 750 MG PO TB24
ORAL_TABLET | ORAL | 2 refills | Status: DC
  Filled 2021-06-12: qty 30, 30d supply, fill #0
  Filled 2021-07-14: qty 30, 30d supply, fill #1
  Filled 2021-08-19: qty 30, 30d supply, fill #2

## 2021-05-21 MED ORDER — AIMOVIG 140 MG/ML ~~LOC~~ SOAJ
SUBCUTANEOUS | 11 refills | Status: DC
  Filled 2021-05-21: qty 1, 30d supply, fill #0
  Filled 2021-07-08 – 2021-07-10 (×2): qty 1, 30d supply, fill #1
  Filled 2021-08-08: qty 1, 30d supply, fill #2
  Filled 2021-09-15: qty 1, 30d supply, fill #3

## 2021-05-21 MED ORDER — BUSPIRONE HCL 10 MG PO TABS
ORAL_TABLET | ORAL | 0 refills | Status: DC
  Filled 2021-05-21: qty 270, 90d supply, fill #0

## 2021-05-21 MED ORDER — PROCHLORPERAZINE MALEATE 10 MG PO TABS
ORAL_TABLET | ORAL | 2 refills | Status: DC
  Filled 2021-05-21: qty 60, 20d supply, fill #0
  Filled 2021-08-01: qty 60, 20d supply, fill #1
  Filled 2021-09-15: qty 60, 20d supply, fill #2

## 2021-05-22 ENCOUNTER — Other Ambulatory Visit (HOSPITAL_BASED_OUTPATIENT_CLINIC_OR_DEPARTMENT_OTHER): Payer: Self-pay

## 2021-05-23 ENCOUNTER — Other Ambulatory Visit (HOSPITAL_BASED_OUTPATIENT_CLINIC_OR_DEPARTMENT_OTHER): Payer: Self-pay

## 2021-05-26 ENCOUNTER — Other Ambulatory Visit (HOSPITAL_BASED_OUTPATIENT_CLINIC_OR_DEPARTMENT_OTHER): Payer: Self-pay

## 2021-05-27 ENCOUNTER — Other Ambulatory Visit (HOSPITAL_BASED_OUTPATIENT_CLINIC_OR_DEPARTMENT_OTHER): Payer: Self-pay

## 2021-05-27 DIAGNOSIS — F17210 Nicotine dependence, cigarettes, uncomplicated: Secondary | ICD-10-CM | POA: Diagnosis not present

## 2021-05-27 DIAGNOSIS — R519 Headache, unspecified: Secondary | ICD-10-CM | POA: Diagnosis not present

## 2021-05-27 DIAGNOSIS — Z79899 Other long term (current) drug therapy: Secondary | ICD-10-CM | POA: Diagnosis not present

## 2021-05-28 ENCOUNTER — Other Ambulatory Visit (HOSPITAL_BASED_OUTPATIENT_CLINIC_OR_DEPARTMENT_OTHER): Payer: Self-pay

## 2021-05-29 ENCOUNTER — Other Ambulatory Visit (HOSPITAL_BASED_OUTPATIENT_CLINIC_OR_DEPARTMENT_OTHER): Payer: Self-pay

## 2021-05-29 DIAGNOSIS — F4310 Post-traumatic stress disorder, unspecified: Secondary | ICD-10-CM | POA: Diagnosis not present

## 2021-05-30 ENCOUNTER — Other Ambulatory Visit (HOSPITAL_BASED_OUTPATIENT_CLINIC_OR_DEPARTMENT_OTHER): Payer: Self-pay

## 2021-06-02 ENCOUNTER — Other Ambulatory Visit (HOSPITAL_BASED_OUTPATIENT_CLINIC_OR_DEPARTMENT_OTHER): Payer: Self-pay

## 2021-06-02 MED ORDER — CLONAZEPAM 0.5 MG PO TABS
ORAL_TABLET | ORAL | 0 refills | Status: DC
  Filled 2021-06-02: qty 45, 90d supply, fill #0

## 2021-06-03 ENCOUNTER — Other Ambulatory Visit (HOSPITAL_BASED_OUTPATIENT_CLINIC_OR_DEPARTMENT_OTHER): Payer: Self-pay

## 2021-06-05 ENCOUNTER — Other Ambulatory Visit (HOSPITAL_BASED_OUTPATIENT_CLINIC_OR_DEPARTMENT_OTHER): Payer: Self-pay

## 2021-06-06 ENCOUNTER — Other Ambulatory Visit (HOSPITAL_BASED_OUTPATIENT_CLINIC_OR_DEPARTMENT_OTHER): Payer: Self-pay

## 2021-06-09 ENCOUNTER — Other Ambulatory Visit (HOSPITAL_BASED_OUTPATIENT_CLINIC_OR_DEPARTMENT_OTHER): Payer: Self-pay

## 2021-06-10 ENCOUNTER — Other Ambulatory Visit (HOSPITAL_BASED_OUTPATIENT_CLINIC_OR_DEPARTMENT_OTHER): Payer: Self-pay

## 2021-06-11 ENCOUNTER — Other Ambulatory Visit (HOSPITAL_BASED_OUTPATIENT_CLINIC_OR_DEPARTMENT_OTHER): Payer: Self-pay

## 2021-06-11 DIAGNOSIS — H04123 Dry eye syndrome of bilateral lacrimal glands: Secondary | ICD-10-CM | POA: Diagnosis not present

## 2021-06-12 ENCOUNTER — Other Ambulatory Visit (HOSPITAL_BASED_OUTPATIENT_CLINIC_OR_DEPARTMENT_OTHER): Payer: Self-pay

## 2021-06-13 ENCOUNTER — Other Ambulatory Visit (HOSPITAL_BASED_OUTPATIENT_CLINIC_OR_DEPARTMENT_OTHER): Payer: Self-pay

## 2021-06-16 ENCOUNTER — Other Ambulatory Visit (HOSPITAL_BASED_OUTPATIENT_CLINIC_OR_DEPARTMENT_OTHER): Payer: Self-pay

## 2021-06-16 DIAGNOSIS — G43009 Migraine without aura, not intractable, without status migrainosus: Secondary | ICD-10-CM | POA: Diagnosis not present

## 2021-06-16 DIAGNOSIS — G47411 Narcolepsy with cataplexy: Secondary | ICD-10-CM | POA: Diagnosis not present

## 2021-06-16 DIAGNOSIS — F250 Schizoaffective disorder, bipolar type: Secondary | ICD-10-CM | POA: Diagnosis not present

## 2021-06-16 DIAGNOSIS — F17200 Nicotine dependence, unspecified, uncomplicated: Secondary | ICD-10-CM | POA: Diagnosis not present

## 2021-06-16 MED ORDER — AIMOVIG 140 MG/ML ~~LOC~~ SOAJ
SUBCUTANEOUS | 11 refills | Status: DC
  Filled 2021-06-16 – 2022-01-26 (×2): qty 1, 30d supply, fill #0
  Filled 2022-01-26: qty 1, fill #0
  Filled 2022-01-26: qty 1, 30d supply, fill #0
  Filled 2022-04-01: qty 1, 30d supply, fill #1

## 2021-06-17 ENCOUNTER — Other Ambulatory Visit (HOSPITAL_BASED_OUTPATIENT_CLINIC_OR_DEPARTMENT_OTHER): Payer: Self-pay

## 2021-06-19 ENCOUNTER — Other Ambulatory Visit (HOSPITAL_BASED_OUTPATIENT_CLINIC_OR_DEPARTMENT_OTHER): Payer: Self-pay

## 2021-06-19 DIAGNOSIS — F429 Obsessive-compulsive disorder, unspecified: Secondary | ICD-10-CM | POA: Diagnosis not present

## 2021-06-19 DIAGNOSIS — F250 Schizoaffective disorder, bipolar type: Secondary | ICD-10-CM | POA: Diagnosis not present

## 2021-06-19 DIAGNOSIS — F902 Attention-deficit hyperactivity disorder, combined type: Secondary | ICD-10-CM | POA: Diagnosis not present

## 2021-06-19 DIAGNOSIS — G47419 Narcolepsy without cataplexy: Secondary | ICD-10-CM | POA: Diagnosis not present

## 2021-06-19 DIAGNOSIS — F4310 Post-traumatic stress disorder, unspecified: Secondary | ICD-10-CM | POA: Diagnosis not present

## 2021-06-19 MED ORDER — PRAZOSIN HCL 5 MG PO CAPS
ORAL_CAPSULE | ORAL | 0 refills | Status: DC
  Filled 2021-06-19: qty 90, 90d supply, fill #0

## 2021-06-20 ENCOUNTER — Other Ambulatory Visit (HOSPITAL_BASED_OUTPATIENT_CLINIC_OR_DEPARTMENT_OTHER): Payer: Self-pay

## 2021-06-23 ENCOUNTER — Other Ambulatory Visit (HOSPITAL_BASED_OUTPATIENT_CLINIC_OR_DEPARTMENT_OTHER): Payer: Self-pay

## 2021-06-24 ENCOUNTER — Other Ambulatory Visit (HOSPITAL_BASED_OUTPATIENT_CLINIC_OR_DEPARTMENT_OTHER): Payer: Self-pay

## 2021-06-25 ENCOUNTER — Other Ambulatory Visit (HOSPITAL_BASED_OUTPATIENT_CLINIC_OR_DEPARTMENT_OTHER): Payer: Self-pay

## 2021-06-25 MED ORDER — QELBREE 200 MG PO CP24
ORAL_CAPSULE | ORAL | 1 refills | Status: DC
  Filled 2021-06-25: qty 60, 30d supply, fill #0
  Filled 2021-08-01: qty 60, 30d supply, fill #1

## 2021-06-26 ENCOUNTER — Other Ambulatory Visit (HOSPITAL_BASED_OUTPATIENT_CLINIC_OR_DEPARTMENT_OTHER): Payer: Self-pay

## 2021-07-07 ENCOUNTER — Other Ambulatory Visit (HOSPITAL_BASED_OUTPATIENT_CLINIC_OR_DEPARTMENT_OTHER): Payer: Self-pay

## 2021-07-08 ENCOUNTER — Other Ambulatory Visit (HOSPITAL_BASED_OUTPATIENT_CLINIC_OR_DEPARTMENT_OTHER): Payer: Self-pay

## 2021-07-09 ENCOUNTER — Other Ambulatory Visit (HOSPITAL_BASED_OUTPATIENT_CLINIC_OR_DEPARTMENT_OTHER): Payer: Self-pay

## 2021-07-09 MED ORDER — GABAPENTIN 300 MG PO CAPS
ORAL_CAPSULE | ORAL | 0 refills | Status: DC
  Filled 2021-07-09: qty 90, 90d supply, fill #0

## 2021-07-10 ENCOUNTER — Other Ambulatory Visit (HOSPITAL_BASED_OUTPATIENT_CLINIC_OR_DEPARTMENT_OTHER): Payer: Self-pay

## 2021-07-11 ENCOUNTER — Telehealth: Payer: 59 | Admitting: Physician Assistant

## 2021-07-11 ENCOUNTER — Other Ambulatory Visit (HOSPITAL_BASED_OUTPATIENT_CLINIC_OR_DEPARTMENT_OTHER): Payer: Self-pay

## 2021-07-11 DIAGNOSIS — R3989 Other symptoms and signs involving the genitourinary system: Secondary | ICD-10-CM

## 2021-07-11 MED ORDER — NITROFURANTOIN MONOHYD MACRO 100 MG PO CAPS
100.0000 mg | ORAL_CAPSULE | Freq: Two times a day (BID) | ORAL | 0 refills | Status: DC
  Filled 2021-07-11: qty 10, 5d supply, fill #0

## 2021-07-11 NOTE — Progress Notes (Signed)

## 2021-07-14 ENCOUNTER — Other Ambulatory Visit (HOSPITAL_BASED_OUTPATIENT_CLINIC_OR_DEPARTMENT_OTHER): Payer: Self-pay

## 2021-07-15 ENCOUNTER — Other Ambulatory Visit (HOSPITAL_BASED_OUTPATIENT_CLINIC_OR_DEPARTMENT_OTHER): Payer: Self-pay

## 2021-07-15 DIAGNOSIS — F250 Schizoaffective disorder, bipolar type: Secondary | ICD-10-CM | POA: Diagnosis not present

## 2021-07-15 DIAGNOSIS — E661 Drug-induced obesity: Secondary | ICD-10-CM | POA: Diagnosis not present

## 2021-07-15 DIAGNOSIS — F429 Obsessive-compulsive disorder, unspecified: Secondary | ICD-10-CM | POA: Diagnosis not present

## 2021-07-15 DIAGNOSIS — G8929 Other chronic pain: Secondary | ICD-10-CM | POA: Diagnosis not present

## 2021-07-15 DIAGNOSIS — F4310 Post-traumatic stress disorder, unspecified: Secondary | ICD-10-CM | POA: Diagnosis not present

## 2021-07-15 DIAGNOSIS — R7303 Prediabetes: Secondary | ICD-10-CM | POA: Diagnosis not present

## 2021-07-15 DIAGNOSIS — F902 Attention-deficit hyperactivity disorder, combined type: Secondary | ICD-10-CM | POA: Diagnosis not present

## 2021-07-15 DIAGNOSIS — F410 Panic disorder [episodic paroxysmal anxiety] without agoraphobia: Secondary | ICD-10-CM | POA: Diagnosis not present

## 2021-07-15 DIAGNOSIS — M25512 Pain in left shoulder: Secondary | ICD-10-CM | POA: Diagnosis not present

## 2021-07-15 MED ORDER — INVEGA SUSTENNA 156 MG/ML IM SUSY
PREFILLED_SYRINGE | INTRAMUSCULAR | 2 refills | Status: DC
Start: 2021-07-15 — End: 2021-12-30
  Filled 2021-07-15: qty 1, 30d supply, fill #0

## 2021-07-15 MED ORDER — CLONAZEPAM 0.5 MG PO TABS
ORAL_TABLET | ORAL | 0 refills | Status: DC
  Filled 2021-07-22: qty 90, 90d supply, fill #0

## 2021-07-15 MED ORDER — CITALOPRAM HYDROBROMIDE 20 MG PO TABS
ORAL_TABLET | ORAL | 0 refills | Status: DC
  Filled 2021-07-15: qty 90, 90d supply, fill #0

## 2021-07-15 MED ORDER — ARMODAFINIL 150 MG PO TABS
ORAL_TABLET | ORAL | 5 refills | Status: DC
  Filled 2021-07-15: qty 30, 30d supply, fill #0
  Filled 2021-08-19: qty 30, 30d supply, fill #1
  Filled 2021-09-16: qty 30, 30d supply, fill #2
  Filled 2021-10-21: qty 30, 30d supply, fill #3
  Filled 2021-11-18: qty 30, 30d supply, fill #4
  Filled 2021-12-31: qty 30, 30d supply, fill #5

## 2021-07-16 ENCOUNTER — Other Ambulatory Visit (HOSPITAL_BASED_OUTPATIENT_CLINIC_OR_DEPARTMENT_OTHER): Payer: Self-pay

## 2021-07-21 ENCOUNTER — Other Ambulatory Visit (HOSPITAL_BASED_OUTPATIENT_CLINIC_OR_DEPARTMENT_OTHER): Payer: Self-pay

## 2021-07-22 ENCOUNTER — Other Ambulatory Visit (HOSPITAL_BASED_OUTPATIENT_CLINIC_OR_DEPARTMENT_OTHER): Payer: Self-pay

## 2021-07-22 DIAGNOSIS — D2372 Other benign neoplasm of skin of left lower limb, including hip: Secondary | ICD-10-CM | POA: Diagnosis not present

## 2021-07-22 DIAGNOSIS — D2371 Other benign neoplasm of skin of right lower limb, including hip: Secondary | ICD-10-CM | POA: Diagnosis not present

## 2021-07-22 DIAGNOSIS — L590 Erythema ab igne [dermatitis ab igne]: Secondary | ICD-10-CM | POA: Diagnosis not present

## 2021-07-24 DIAGNOSIS — M7989 Other specified soft tissue disorders: Secondary | ICD-10-CM | POA: Diagnosis not present

## 2021-07-24 DIAGNOSIS — M79605 Pain in left leg: Secondary | ICD-10-CM | POA: Diagnosis not present

## 2021-07-29 ENCOUNTER — Other Ambulatory Visit (HOSPITAL_BASED_OUTPATIENT_CLINIC_OR_DEPARTMENT_OTHER): Payer: Self-pay

## 2021-07-31 DIAGNOSIS — H04123 Dry eye syndrome of bilateral lacrimal glands: Secondary | ICD-10-CM | POA: Diagnosis not present

## 2021-07-31 DIAGNOSIS — H52223 Regular astigmatism, bilateral: Secondary | ICD-10-CM | POA: Diagnosis not present

## 2021-07-31 DIAGNOSIS — H10023 Other mucopurulent conjunctivitis, bilateral: Secondary | ICD-10-CM | POA: Diagnosis not present

## 2021-08-01 ENCOUNTER — Other Ambulatory Visit (HOSPITAL_BASED_OUTPATIENT_CLINIC_OR_DEPARTMENT_OTHER): Payer: Self-pay

## 2021-08-01 MED ORDER — FENOFIBRATE 160 MG PO TABS
ORAL_TABLET | ORAL | 1 refills | Status: DC
  Filled 2021-08-01: qty 90, 90d supply, fill #0
  Filled 2021-11-18: qty 90, 90d supply, fill #1

## 2021-08-04 ENCOUNTER — Other Ambulatory Visit (HOSPITAL_BASED_OUTPATIENT_CLINIC_OR_DEPARTMENT_OTHER): Payer: Self-pay

## 2021-08-06 ENCOUNTER — Emergency Department (HOSPITAL_BASED_OUTPATIENT_CLINIC_OR_DEPARTMENT_OTHER)
Admission: EM | Admit: 2021-08-06 | Discharge: 2021-08-06 | Disposition: A | Discharge status: Home / Self Care | Payer: 59 | Attending: Emergency Medicine | Admitting: Emergency Medicine

## 2021-08-06 ENCOUNTER — Other Ambulatory Visit: Payer: Self-pay

## 2021-08-06 ENCOUNTER — Encounter (HOSPITAL_BASED_OUTPATIENT_CLINIC_OR_DEPARTMENT_OTHER): Payer: Self-pay

## 2021-08-06 DIAGNOSIS — W5501XA Bitten by cat, initial encounter: Secondary | ICD-10-CM | POA: Diagnosis not present

## 2021-08-06 DIAGNOSIS — S0185XA Open bite of other part of head, initial encounter: Secondary | ICD-10-CM | POA: Diagnosis not present

## 2021-08-06 DIAGNOSIS — R220 Localized swelling, mass and lump, head: Secondary | ICD-10-CM | POA: Diagnosis not present

## 2021-08-06 DIAGNOSIS — Z79899 Other long term (current) drug therapy: Secondary | ICD-10-CM | POA: Diagnosis not present

## 2021-08-06 DIAGNOSIS — Z7901 Long term (current) use of anticoagulants: Secondary | ICD-10-CM | POA: Insufficient documentation

## 2021-08-06 MED ORDER — METRONIDAZOLE 500 MG PO TABS: 500.0000 mg | ORAL_TABLET | Freq: Two times a day (BID) | ORAL | 0 refills | Status: AC

## 2021-08-06 MED ORDER — LEVOFLOXACIN 750 MG PO TABS: 750.0000 mg | ORAL_TABLET | Freq: Every day | ORAL | 0 refills | Status: AC

## 2021-08-06 NOTE — Discharge Instructions (Signed)
You were seen in the emergency department today after being bit by her cat.  I prescribed you 2 antibiotics.  You will take both of these for 7 days.  The first is Levaquin once a day for 7 days.  The other 1 is Flagyl that you take twice a day for 7 days.  You have been prescribed an antibiotic called metronidazole, also known as Flagyl. Some side effects of metronidazole you should be aware of are nausea and headache. Take the medication with food to minimize stomach upset. It is imperative that you do not drink alcohol while taking this antibiotic. Please take the medication as prescribed. Please take the medication until your antibiotic course is complete.  Please return to the emergency department for any worsening symptoms including fever, significant swelling of your face. ?

## 2021-08-06 NOTE — ED Notes (Signed)
Pt A&Ox4 ambulatory at d/c with independent steady gait, NAD. Pt verbalized understanding of d/c instructions, prescriptions and follow up care. ?

## 2021-08-06 NOTE — ED Triage Notes (Signed)
Pt c/o pain and swelling to L nare x 3 days. Pt has cats that sometimes bites on her nose.  ?

## 2021-08-06 NOTE — ED Provider Notes (Signed)
?[facility] EMERGENC[facility] DEPARTMENT ?Provider Note ? ? ?CSN: [id number] ?Arrival date & time: 08/06/21  1839 ? ?  ? ?History ? ?Chief Complaint  ?Patient presents with  ? Facial Swelling  ? ? ?Allison Cunningham is a 41 y.o. female.  With past medical history of anxiety who presents to the emergency department with nose pain. ? ?Patient states that about 1 week ago she was bitten on her left nare by her cat.  She states that about 3 days ago she began having worsening pain to the left nare and clear drainage.  She is also having some pain over the left sinus.  She denies any fevers or purulent drainage from her nose.  Denies cough, sore throat, ear pain. ? ?HPI ? ?  ? ?Home Medications ?Prior to Admission medications   ?Medication Sig Start Date End Date Taking? Authorizing Provider  ?acetaminophen (TYLENOL) 325 MG tablet Take 650 mg by mouth every 6 (six) hours as needed. Patient used this medication for pain.    [provider]  ?albuterol (VENTOLIN HFA) 108 (90 Base) MCG/ACT inhaler INHALE 2 PUFFS BY MOUTH INTO THE LUNGS EVERY 4 TO 6 HOURS AS NEEDED FOR WHEEZING OR SHORTNESS OF BREATH. 01/30/20 01/29/21  Joneen Boers, MD  ?albuterol (VENTOLIN HFA) 108 (90 Base) MCG/ACT inhaler Inhale 2 puffs into the lungs every 4 (four) hours as needed. 12/18/20     ?apixaban (ELIQUIS) 5 MG TABS tablet TAKE 1 TABLET BY MOUTH TWICE DAILY 06/17/20 06/17/21  Margarito Courser, MD  ?APIXABAN Arne Cleveland) VTE STARTER PACK ('10MG'$  AND '5MG'$ ) TAKE TWO '5MG'$  TABLETS BY MOUTH 2 TIMES DAILY FOR 1 WEEK THEN ONE '5MG'$  TABLET 2 TIMES DAILY THEREAFTER 05/24/20 05/24/21  Raspet, Derry Skill, PA-C  ?Armodafinil 150 MG tablet Take one tablet (150 mg dose) by mouth daily. 07/15/21     ?Armodafinil 50 MG tablet Take two tablets (100 mg dose) by mouth daily. 09/27/20     ?atorvastatin (LIPITOR) 10 MG tablet Take one tablet (10 mg dose) by mouth at bedtime. 04/23/21     ?baclofen (LIORESAL) 10 MG tablet Take one tablet (10 mg dose) by mouth 3 (three) times a day as needed  (Muscle spasm, muscle pain). 05/20/21     ?Biotin 5000 MCG TABS Take 1 tablet by mouth daily.    [provider]  ?Brexpiprazole 0.25 MG TABS Take by mouth. 04/09/17   [provider]  ?busPIRone (BUSPAR) 10 MG tablet Take 10 mg by mouth 2 (two) times daily. 04/15/18   [provider]  ?busPIRone (BUSPAR) 10 MG tablet Take 1 tablet by mouth 3 times a day 11/08/20     ?busPIRone (BUSPAR) 10 MG tablet Take 1 tablet (10 mg total) by mouth 3 (three) times daily. 02/14/21     ?busPIRone (BUSPAR) 10 MG tablet Take 1 tablet by mouth 3 times a day 05/21/21     ?carvedilol (COREG) 3.125 MG tablet Take 3.125 mg by mouth 2 (two) times daily. 12/25/19   [provider]  ?carvedilol (COREG) 3.125 MG tablet TAKE 1 TABLET BY MOUTH TWICE DAILY 04/23/21     ?cetirizine (ZYRTEC) 10 MG tablet Take 10 mg by mouth daily.    [provider]  ?chlorhexidine (PERIDEX) 0.12 % solution Swish and spit 15 mLs 2 (two) times daily for 7 days. 05/02/21     ?chlorzoxazone (PARAFON) 500 MG tablet One tablet as needed up to four times daily for headache 02/19/20   [provider]  ?chlorzoxazone (PARAFON) 500  MG tablet Take 1 tablet by mouth up to four times daily for headache 07/29/20     ?citalopram (CELEXA) 10 MG tablet Take 10 mg by mouth daily. 05/16/18   [provider]  ?citalopram (CELEXA) 20 MG tablet TAKE 1 TABLET BY MOUTH AT BEDTIME 07/09/20 07/09/21  Elsie Stain, PA  ?citalopram (CELEXA) 20 MG tablet TAKE 1 TABLET BY MOUTH EVERY NIGHT AT BEDTIME 04/09/20 04/09/21  Elsie Stain, PA  ?citalopram (CELEXA) 20 MG tablet TAKE 1 TABLET BY MOUTH AT BEDTIME 12/20/19 12/19/20  Elsie Stain, PA  ?citalopram (CELEXA) 20 MG tablet Take 1 tablet by mouth at bedtime 01/14/21     ?citalopram (CELEXA) 20 MG tablet Take 1 tablet by mouth at bedtime 07/15/21     ?clonazePAM (KLONOPIN) 0.5 MG tablet Take 0.5 mg by mouth 2 (two) times daily.    [provider]  ?clonazePAM (KLONOPIN) 0.5 MG tablet TAKE 1/2  TABLET BY MOUTH DAILY 04/09/20 10/06/20  Elsie Stain, PA  ?clonazePAM (KLONOPIN) 0.5 MG tablet Take 1/2 tablet (0.25 mg total) by mouth daily. 02/14/21     ?clonazePAM (KLONOPIN) 0.5 MG tablet Take 1 tablet by mouth daily 07/15/21     ?cyclobenzaprine (FLEXERIL) 10 MG tablet Take one tablet (10 mg dose) by mouth 2 (two) times a day as needed for Muscle spasms for up to 10 days. 05/14/21     ?diclofenac (VOLTAREN) 75 MG EC tablet Take 75 mg by mouth 2 (two) times daily. 10/09/19   [provider]  ?diclofenac (VOLTAREN) 75 MG EC tablet Take 1 tablet (75 mg total) by mouth 2 (two) times daily. 04/18/20   Wallene Huh, DPM  ?dicyclomine (BENTYL) 20 MG tablet Take one tablet (20 mg dose) by mouth 2 (two) times daily. 07/31/20     ?Erenumab-aooe (AIMOVIG) 140 MG/ML SOAJ Inject 140 mg into the skin every 30 (thirty) days. 05/21/21     ?Erenumab-aooe (AIMOVIG) 140 MG/ML SOAJ Inject 140 mg into the skin every 30 (thirty) days. 06/16/21     ?fenofibrate 160 MG tablet TAKE ONE TABLET (160 MG DOSE) BY MOUTH DAILY. 05/20/20 05/20/21  Margarito Courser, MD  ?fenofibrate 160 MG tablet Take one tablet (160 mg dose) by mouth daily. 08/01/21     ?fluconazole (DIFLUCAN) 150 MG tablet SMARTSIG:1 Tablet(s) By Mouth 12/04/19   [provider]  ?fluticasone (FLONASE) 50 MCG/ACT nasal spray Place 2 sprays into both nostrils daily. 09/05/20   Brunetta Jeans, PA-C  ?fluticasone Asencion Islam) 50 MCG/ACT nasal spray Place 1 spray into the nose daily 10/08/20     ?fluticasone (FLONASE) 50 MCG/ACT nasal spray Place 1 spray into both nostrils daily. 12/18/20     ?gabapentin (NEURONTIN) 100 MG capsule Take one capsule (100 mg dose) by mouth at bedtime for 1 week and then increase to 2 capsules at bedtime for 1 week and then increase to 3 capsules at bedtime to continue 05/20/21     ?gabapentin (NEURONTIN) 300 MG capsule Take one capsule (300 mg dose) by mouth at bedtime. 07/09/21     ?HYDROcodone-acetaminophen (NORCO/VICODIN) 5-325 MG tablet Take one  tablet by mouth every 4 (four) hours as needed for Pain for up to 3 days. 07/17/20     ?hydrOXYzine (ATARAX) 50 MG tablet Take 1 by mouth at bedtime 05/21/21     ?hydrOXYzine (ATARAX/VISTARIL) 25 MG tablet Take by mouth. 09/08/19   [provider]  ?hydrOXYzine (ATARAX/VISTARIL) 50 MG tablet Take 1 tablet by mouth at bedtime 11/08/20     ?  hydrOXYzine (ATARAX/VISTARIL) 50 MG tablet Take 1 tablet (50 mg total) by mouth at bedtime. 02/14/21     ?ketorolac (TORADOL) 10 MG tablet Take one tablet (10 mg dose) by mouth every 6 (six) hours as needed. 07/17/20     ?meloxicam (MOBIC) 15 MG tablet Take one tablet (15 mg dose) by mouth daily. Take with meals 05/20/21     ?metFORMIN (GLUCOPHAGE-XR) 500 MG 24 hr tablet TAKE 1 TABLET BY MOUTH DAILY WITH FOOD 04/09/20 04/09/21  Elsie Stain, PA  ?metFORMIN (GLUCOPHAGE-XR) 500 MG 24 hr tablet TAKE 1 TABLET BY MOUTH ONCE DAILY WITH FOOD 03/18/20 03/18/21  Elsie Stain, PA  ?metFORMIN (GLUCOPHAGE-XR) 500 MG 24 hr tablet TAKE 1 TABLET BY MOUTH ONCE DAILY WITH FOOD 01/19/20 01/18/21  Elsie Stain, PA  ?metFORMIN (GLUCOPHAGE-XR) 750 MG 24 hr tablet TAKE 1 TABLET BY MOUTH ONCE DAILY 06/10/20 06/10/21  Elsie Stain, PA  ?metFORMIN (GLUCOPHAGE-XR) 750 MG 24 hr tablet Take 1 tablet by mouth daily 05/21/21     ?modafinil (PROVIGIL) 200 MG tablet Take 300 mg by mouth every morning. 09/17/19   [provider]  ?modafinil (PROVIGIL) 200 MG tablet TAKE 1 & 1/2 TABLETS BY MOUTH EVERY MORNING 05/20/20 11/16/20  Rose, Crystal, PA-C  ?modafinil (PROVIGIL) 200 MG tablet TAKE 1 & 1/2 TABLETS BY MOUTH EVERY MORNING **NEEDS OFFICE VISIT FOR FURTHER REFILLS** 03/12/20 09/08/20  Rose, Donella Stade, PA-C  ?modafinil (PROVIGIL) 200 MG tablet TAKE 1 & 1/2 TABLETS BY MOUTH EVERY MORNING **NEEDS OFFICE VISIT FOR FURTHER REFILLS** 07/18/20     ?nitrofurantoin, macrocrystal-monohydrate, (MACROBID) 100 MG capsule Take 1 capsule (100 mg total) by mouth 2 (two) times daily. 07/11/21   Mar Daring, PA-C   ?Norgestimate-Ethinyl Estradiol Triphasic 0.18/0.215/0.25 MG-25 MCG tab TAKE 1 TABLET BY MOUTH ONCE DAILY 01/16/20 01/15/21  Kang-Oh, Denny Peon E, DO  ?omeprazole (PRILOSEC) 40 MG capsule TAKE 1 CAPSULE (40 MG TOTAL) BY MOUTH

## 2021-08-08 ENCOUNTER — Other Ambulatory Visit (HOSPITAL_BASED_OUTPATIENT_CLINIC_OR_DEPARTMENT_OTHER): Payer: Self-pay

## 2021-08-19 ENCOUNTER — Other Ambulatory Visit (HOSPITAL_BASED_OUTPATIENT_CLINIC_OR_DEPARTMENT_OTHER): Payer: Self-pay

## 2021-08-19 MED ORDER — OMEPRAZOLE 40 MG PO CPDR
40.0000 mg | DELAYED_RELEASE_CAPSULE | Freq: Every day | ORAL | 0 refills | Status: DC
  Filled 2021-08-19: qty 90, 90d supply, fill #0

## 2021-08-20 ENCOUNTER — Other Ambulatory Visit: Payer: Self-pay

## 2021-08-20 ENCOUNTER — Encounter (HOSPITAL_BASED_OUTPATIENT_CLINIC_OR_DEPARTMENT_OTHER): Payer: Self-pay | Admitting: Emergency Medicine

## 2021-08-20 ENCOUNTER — Other Ambulatory Visit (HOSPITAL_BASED_OUTPATIENT_CLINIC_OR_DEPARTMENT_OTHER): Payer: Self-pay

## 2021-08-20 ENCOUNTER — Emergency Department (HOSPITAL_BASED_OUTPATIENT_CLINIC_OR_DEPARTMENT_OTHER)
Admission: EM | Admit: 2021-08-20 | Discharge: 2021-08-20 | Disposition: A | Discharge status: Home / Self Care | Payer: 59 | Attending: Emergency Medicine | Admitting: Emergency Medicine

## 2021-08-20 ENCOUNTER — Emergency Department (HOSPITAL_BASED_OUTPATIENT_CLINIC_OR_DEPARTMENT_OTHER): Payer: 59

## 2021-08-20 DIAGNOSIS — R1031 Right lower quadrant pain: Secondary | ICD-10-CM | POA: Insufficient documentation

## 2021-08-20 DIAGNOSIS — R109 Unspecified abdominal pain: Secondary | ICD-10-CM | POA: Diagnosis not present

## 2021-08-20 DIAGNOSIS — D72829 Elevated white blood cell count, unspecified: Secondary | ICD-10-CM | POA: Diagnosis not present

## 2021-08-20 DIAGNOSIS — R1033 Periumbilical pain: Secondary | ICD-10-CM | POA: Diagnosis not present

## 2021-08-20 DIAGNOSIS — R630 Anorexia: Secondary | ICD-10-CM | POA: Diagnosis not present

## 2021-08-20 DIAGNOSIS — Z7951 Long term (current) use of inhaled steroids: Secondary | ICD-10-CM | POA: Insufficient documentation

## 2021-08-20 DIAGNOSIS — R1110 Vomiting, unspecified: Secondary | ICD-10-CM | POA: Diagnosis not present

## 2021-08-20 LAB — URINALYSIS, MICROSCOPIC (REFLEX): RBC / HPF: >50 RBC/hpf (ref 0–5)

## 2021-08-20 LAB — CBC
HCT: 38.3 % (ref 36.0–46.0)
Hemoglobin: 12.8 g/dL (ref 12.0–15.0)
MCH: 29.2 pg (ref 26.0–34.0)
MCHC: 33.4 g/dL (ref 30.0–36.0)
MCV: 87.2 fL (ref 80.0–100.0)
Platelets: 351 10*3/uL (ref 150–400)
RBC: 4.39 MIL/uL (ref 3.87–5.11)
RDW: 13.3 % (ref 11.5–15.5)
WBC: 12.3 10*3/uL — ABNORMAL HIGH (ref 4.0–10.5)
nRBC: 0 % (ref 0.0–0.2)

## 2021-08-20 LAB — COMPREHENSIVE METABOLIC PANEL
ALT: 30 U/L (ref 0–44)
AST: 40 U/L (ref 15–41)
Albumin: 3.4 g/dL — ABNORMAL LOW (ref 3.5–5.0)
Alkaline Phosphatase: 44 U/L (ref 38–126)
Anion gap: 8 (ref 5–15)
BUN: 11 mg/dL (ref 6–20)
CO2: 22 mmol/L (ref 22–32)
Calcium: 9 mg/dL (ref 8.9–10.3)
Chloride: 108 mmol/L (ref 98–111)
Creatinine, Ser: 0.74 mg/dL (ref 0.44–1.00)
GFR, Estimated: >60 mL/min (ref >60)
Glucose, Bld: 100 mg/dL — ABNORMAL HIGH (ref 70–99)
Potassium: 3.8 mmol/L (ref 3.5–5.1)
Sodium: 138 mmol/L (ref 135–145)
Total Bilirubin: 0.5 mg/dL (ref 0.3–1.2)
Total Protein: 6.5 g/dL (ref 6.5–8.1)

## 2021-08-20 LAB — URINALYSIS, ROUTINE W REFLEX MICROSCOPIC
Bilirubin Urine: NEGATIVE
Glucose, UA: NEGATIVE mg/dL
Ketones, ur: NEGATIVE mg/dL
Nitrite: NEGATIVE
Protein, ur: NEGATIVE mg/dL
Specific Gravity, Urine: 1.02 (ref 1.005–1.030)
pH: 7 (ref 5.0–8.0)

## 2021-08-20 LAB — LIPASE, BLOOD: Lipase: 26 U/L (ref 11–51)

## 2021-08-20 LAB — PREGNANCY, URINE: Preg Test, Ur: NEGATIVE

## 2021-08-20 MED ORDER — ONDANSETRON 4 MG PO TBDP
4.0000 mg | ORAL_TABLET | Freq: Once | ORAL | Status: AC
  Administered 2021-08-20: 4 mg via ORAL

## 2021-08-20 MED ORDER — CEFDINIR 300 MG PO CAPS
300.0000 mg | ORAL_CAPSULE | Freq: Two times a day (BID) | ORAL | 0 refills | Status: DC
  Filled 2021-08-20: qty 10, 5d supply, fill #0

## 2021-08-20 MED ORDER — IOHEXOL 300 MG/ML  SOLN
100.0000 mL | Freq: Once | INTRAMUSCULAR | Status: AC | PRN
  Administered 2021-08-20: 100 mL via INTRAVENOUS

## 2021-08-20 MED ORDER — CEFDINIR 300 MG PO CAPS: 300.0000 mg | ORAL_CAPSULE | Freq: Two times a day (BID) | ORAL | 0 refills | Status: DC

## 2021-08-20 MED ORDER — ONDANSETRON 4 MG PO TBDP
ORAL_TABLET | ORAL | Status: AC
  Filled 2021-08-20: qty 1

## 2021-08-20 NOTE — ED Triage Notes (Signed)
Pt reports NVD x 3d; pain to RLQ since last pm ?

## 2021-08-20 NOTE — Discharge Instructions (Addendum)
Please read and follow all provided instructions. ? ?Your diagnoses today include:  ?1. RLQ abdominal pain   ? ? ?Tests performed today include: ?Blood cell counts and platelets: Your white blood cell count was high ?Kidney and liver function tests ?Pancreas function test (called lipase) ?Urine test to look for infection: You did have some red and white blood cells in your urine ?A blood or urine test for pregnancy (women only) ?Vital signs. See below for your results today.  ? ?Medications prescribed:  ?Cefdinir - antibiotic for urine infection ? ?Take any prescribed medications only as directed. ? ?Home care instructions:  ?Follow any educational materials contained in this packet. ? ?Follow-up instructions: ?Please follow-up with your primary care provider in the next 2 days for further evaluation of your symptoms.   ? ?Return instructions:  ?SEEK IMMEDIATE MEDICAL ATTENTION IF: ?The pain does not go away or becomes severe  ?A temperature above 101F develops  ?Repeated vomiting occurs (multiple episodes)  ?The pain becomes localized to portions of the abdomen. The right side could possibly be appendicitis. In an adult, the left lower portion of the abdomen could be colitis or diverticulitis.  ?Blood is being passed in stools or vomit (bright red or black tarry stools)  ?You develop chest pain, difficulty breathing, dizziness or fainting, or become confused, poorly responsive, or inconsolable (young children) ?If you have any other emergent concerns regarding your health ? ?Additional Information: ?Abdominal (belly) pain can be caused by many things. Your caregiver performed an examination and possibly ordered blood/urine tests and imaging (CT scan, x-rays, ultrasound). Many cases can be observed and treated at home after initial evaluation in the emergency department. Even though you are being discharged home, abdominal pain can be unpredictable. Therefore, you need a repeated exam if your pain does not resolve,  returns, or worsens. Most patients with abdominal pain don't have to be admitted to the hospital or have surgery, but serious problems like appendicitis and gallbladder attacks can start out as nonspecific pain. Many abdominal conditions cannot be diagnosed in one visit, so follow-up evaluations are very important. ? ?Your vital signs today were: ?BP 120/67   Pulse 83   Temp 98.3 ?F (36.8 ?C) (Oral)   Resp 16   Ht '5\' 4"'$  (1.626 m)   Wt 99.8 kg   LMP 08/15/2021   SpO2 95%   BMI 37.76 kg/m?  ?If your blood pressure (bp) was elevated above 135/85 this visit, please have this repeated by your doctor within one month. ?-------------- ? ?

## 2021-08-20 NOTE — ED Provider Notes (Signed)
?[facility] ?Provider Note ? ? ?CSN: [id number] ?Arrival date & time: 08/20/21  1317 ? ?  ? ?History ? ?Chief Complaint  ?Patient presents with  ? Abdominal Pain  ? Emesis  ? ? ?Allison Cunningham is a 41 y.o. female. ? ?Patient with no past surgical history presents to the emergency department today for evaluation of abdominal pain.  Symptoms started about 3 days ago.  Two days ago she had decreased appetite with vomiting.  She had abdominal pain which began around the umbilicus.  This has since moved to the right lower quadrant.  She has had decreased appetite.  She has been sipping on small amounts of water and soda.  Last oral intake was yesterday.  No dysuria, increased frequency or urgency, hematuria.  No chest pain or shortness of breath.  Pain is worse with movements.  No reported fever.  No known sick contacts. ? ? ?  ? ?Home Medications ?Prior to Admission medications   ?Medication Sig Start Date End Date Taking? Authorizing Provider  ?acetaminophen (TYLENOL) 325 MG tablet Take 650 mg by mouth every 6 (six) hours as needed. Patient used this medication for pain.    [provider]  ?albuterol (VENTOLIN HFA) 108 (90 Base) MCG/ACT inhaler INHALE 2 PUFFS BY MOUTH INTO THE LUNGS EVERY 4 TO 6 HOURS AS NEEDED FOR WHEEZING OR SHORTNESS OF BREATH. 01/30/20 01/29/21  Joneen Boers, MD  ?albuterol (VENTOLIN HFA) 108 (90 Base) MCG/ACT inhaler Inhale 2 puffs into the lungs every 4 (four) hours as needed. 12/18/20     ?apixaban (ELIQUIS) 5 MG TABS tablet TAKE 1 TABLET BY MOUTH TWICE DAILY 06/17/20 06/17/21  Margarito Courser, MD  ?APIXABAN Arne Cleveland) VTE STARTER PACK ('10MG'$  AND '5MG'$ ) TAKE TWO '5MG'$  TABLETS BY MOUTH 2 TIMES DAILY FOR 1 WEEK THEN ONE '5MG'$  TABLET 2 TIMES DAILY THEREAFTER 05/24/20 05/24/21  Raspet, Derry Skill, PA-C  ?Armodafinil 150 MG tablet Take one tablet (150 mg dose) by mouth daily. 07/15/21     ?Armodafinil 50 MG tablet Take two tablets (100 mg dose) by mouth daily. 09/27/20     ?atorvastatin  (LIPITOR) 10 MG tablet Take one tablet (10 mg dose) by mouth at bedtime. 04/23/21     ?baclofen (LIORESAL) 10 MG tablet Take one tablet (10 mg dose) by mouth 3 (three) times a day as needed (Muscle spasm, muscle pain). 05/20/21     ?Biotin 5000 MCG TABS Take 1 tablet by mouth daily.    [provider]  ?Brexpiprazole 0.25 MG TABS Take by mouth. 04/09/17   [provider]  ?busPIRone (BUSPAR) 10 MG tablet Take 10 mg by mouth 2 (two) times daily. 04/15/18   [provider]  ?busPIRone (BUSPAR) 10 MG tablet Take 1 tablet by mouth 3 times a day 11/08/20     ?busPIRone (BUSPAR) 10 MG tablet Take 1 tablet (10 mg total) by mouth 3 (three) times daily. 02/14/21     ?busPIRone (BUSPAR) 10 MG tablet Take 1 tablet by mouth 3 times a day 05/21/21     ?carvedilol (COREG) 3.125 MG tablet Take 3.125 mg by mouth 2 (two) times daily. 12/25/19   [provider]  ?carvedilol (COREG) 3.125 MG tablet TAKE 1 TABLET BY MOUTH TWICE DAILY 04/23/21     ?cetirizine (ZYRTEC) 10 MG tablet Take 10 mg by mouth daily.    [provider]  ?chlorhexidine (PERIDEX) 0.12 % solution Swish and spit 15 mLs 2 (two) times daily for 7 days. 05/02/21     ?  chlorzoxazone (PARAFON) 500 MG tablet One tablet as needed up to four times daily for headache 02/19/20   [provider]  ?chlorzoxazone (PARAFON) 500 MG tablet Take 1 tablet by mouth up to four times daily for headache 07/29/20     ?citalopram (CELEXA) 10 MG tablet Take 10 mg by mouth daily. 05/16/18   [provider]  ?citalopram (CELEXA) 20 MG tablet TAKE 1 TABLET BY MOUTH AT BEDTIME 07/09/20 07/09/21  Elsie Stain, PA  ?citalopram (CELEXA) 20 MG tablet TAKE 1 TABLET BY MOUTH EVERY NIGHT AT BEDTIME 04/09/20 04/09/21  Elsie Stain, PA  ?citalopram (CELEXA) 20 MG tablet TAKE 1 TABLET BY MOUTH AT BEDTIME 12/20/19 12/19/20  Elsie Stain, PA  ?citalopram (CELEXA) 20 MG tablet Take 1 tablet by mouth at bedtime 01/14/21     ?citalopram (CELEXA) 20 MG tablet Take 1  tablet by mouth at bedtime 07/15/21     ?clonazePAM (KLONOPIN) 0.5 MG tablet Take 0.5 mg by mouth 2 (two) times daily.    [provider]  ?clonazePAM (KLONOPIN) 0.5 MG tablet TAKE 1/2 TABLET BY MOUTH DAILY 04/09/20 10/06/20  Elsie Stain, PA  ?clonazePAM (KLONOPIN) 0.5 MG tablet Take 1/2 tablet (0.25 mg total) by mouth daily. 02/14/21     ?clonazePAM (KLONOPIN) 0.5 MG tablet Take 1 tablet by mouth daily 07/15/21     ?cyclobenzaprine (FLEXERIL) 10 MG tablet Take one tablet (10 mg dose) by mouth 2 (two) times a day as needed for Muscle spasms for up to 10 days. 05/14/21     ?diclofenac (VOLTAREN) 75 MG EC tablet Take 75 mg by mouth 2 (two) times daily. 10/09/19   [provider]  ?diclofenac (VOLTAREN) 75 MG EC tablet Take 1 tablet (75 mg total) by mouth 2 (two) times daily. 04/18/20   Wallene Huh, DPM  ?dicyclomine (BENTYL) 20 MG tablet Take one tablet (20 mg dose) by mouth 2 (two) times daily. 07/31/20     ?Erenumab-aooe (AIMOVIG) 140 MG/ML SOAJ Inject 140 mg into the skin every 30 (thirty) days. 05/21/21     ?Erenumab-aooe (AIMOVIG) 140 MG/ML SOAJ Inject 140 mg into the skin every 30 (thirty) days. 06/16/21     ?fenofibrate 160 MG tablet TAKE ONE TABLET (160 MG DOSE) BY MOUTH DAILY. 05/20/20 05/20/21  Margarito Courser, MD  ?fenofibrate 160 MG tablet Take one tablet (160 mg dose) by mouth daily. 08/01/21     ?fluconazole (DIFLUCAN) 150 MG tablet SMARTSIG:1 Tablet(s) By Mouth 12/04/19   [provider]  ?fluticasone (FLONASE) 50 MCG/ACT nasal spray Place 2 sprays into both nostrils daily. 09/05/20   Brunetta Jeans, PA-C  ?fluticasone Asencion Islam) 50 MCG/ACT nasal spray Place 1 spray into the nose daily 10/08/20     ?fluticasone (FLONASE) 50 MCG/ACT nasal spray Place 1 spray into both nostrils daily. 12/18/20     ?gabapentin (NEURONTIN) 100 MG capsule Take one capsule (100 mg dose) by mouth at bedtime for 1 week and then increase to 2 capsules at bedtime for 1 week and then increase to 3 capsules at bedtime to  continue 05/20/21     ?gabapentin (NEURONTIN) 300 MG capsule Take one capsule (300 mg dose) by mouth at bedtime. 07/09/21     ?HYDROcodone-acetaminophen (NORCO/VICODIN) 5-325 MG tablet Take one tablet by mouth every 4 (four) hours as needed for Pain for up to 3 days. 07/17/20     ?hydrOXYzine (ATARAX) 50 MG tablet Take 1 by mouth at bedtime 05/21/21     ?hydrOXYzine (ATARAX/VISTARIL) 25 MG tablet Take  by mouth. 09/08/19   [provider]  ?hydrOXYzine (ATARAX/VISTARIL) 50 MG tablet Take 1 tablet by mouth at bedtime 11/08/20     ?hydrOXYzine (ATARAX/VISTARIL) 50 MG tablet Take 1 tablet (50 mg total) by mouth at bedtime. 02/14/21     ?ketorolac (TORADOL) 10 MG tablet Take one tablet (10 mg dose) by mouth every 6 (six) hours as needed. 07/17/20     ?meloxicam (MOBIC) 15 MG tablet Take one tablet (15 mg dose) by mouth daily. Take with meals 05/20/21     ?metFORMIN (GLUCOPHAGE-XR) 500 MG 24 hr tablet TAKE 1 TABLET BY MOUTH DAILY WITH FOOD 04/09/20 04/09/21  Elsie Stain, PA  ?metFORMIN (GLUCOPHAGE-XR) 500 MG 24 hr tablet TAKE 1 TABLET BY MOUTH ONCE DAILY WITH FOOD 03/18/20 03/18/21  Elsie Stain, PA  ?metFORMIN (GLUCOPHAGE-XR) 500 MG 24 hr tablet TAKE 1 TABLET BY MOUTH ONCE DAILY WITH FOOD 01/19/20 01/18/21  Elsie Stain, PA  ?metFORMIN (GLUCOPHAGE-XR) 750 MG 24 hr tablet TAKE 1 TABLET BY MOUTH ONCE DAILY 06/10/20 06/10/21  Elsie Stain, PA  ?metFORMIN (GLUCOPHAGE-XR) 750 MG 24 hr tablet Take 1 tablet by mouth daily 05/21/21     ?modafinil (PROVIGIL) 200 MG tablet Take 300 mg by mouth every morning. 09/17/19   [provider]  ?modafinil (PROVIGIL) 200 MG tablet TAKE 1 & 1/2 TABLETS BY MOUTH EVERY MORNING 05/20/20 11/16/20  Rose, Crystal, PA-C  ?modafinil (PROVIGIL) 200 MG tablet TAKE 1 & 1/2 TABLETS BY MOUTH EVERY MORNING **NEEDS OFFICE VISIT FOR FURTHER REFILLS** 03/12/20 09/08/20  Rose, Donella Stade, PA-C  ?modafinil (PROVIGIL) 200 MG tablet TAKE 1 & 1/2 TABLETS BY MOUTH EVERY MORNING **NEEDS OFFICE VISIT FOR FURTHER REFILLS**  07/18/20     ?nitrofurantoin, macrocrystal-monohydrate, (MACROBID) 100 MG capsule Take 1 capsule (100 mg total) by mouth 2 (two) times daily. 07/11/21   Mar Daring, PA-C  ?Norgestimate-Ethinyl Estradiol

## 2021-08-21 ENCOUNTER — Other Ambulatory Visit (HOSPITAL_BASED_OUTPATIENT_CLINIC_OR_DEPARTMENT_OTHER): Payer: Self-pay

## 2021-08-21 DIAGNOSIS — E661 Drug-induced obesity: Secondary | ICD-10-CM | POA: Diagnosis not present

## 2021-08-21 DIAGNOSIS — F429 Obsessive-compulsive disorder, unspecified: Secondary | ICD-10-CM | POA: Diagnosis not present

## 2021-08-21 DIAGNOSIS — R7303 Prediabetes: Secondary | ICD-10-CM | POA: Diagnosis not present

## 2021-08-21 DIAGNOSIS — F410 Panic disorder [episodic paroxysmal anxiety] without agoraphobia: Secondary | ICD-10-CM | POA: Diagnosis not present

## 2021-08-21 DIAGNOSIS — F4310 Post-traumatic stress disorder, unspecified: Secondary | ICD-10-CM | POA: Diagnosis not present

## 2021-08-21 DIAGNOSIS — F250 Schizoaffective disorder, bipolar type: Secondary | ICD-10-CM | POA: Diagnosis not present

## 2021-08-23 LAB — URINE CULTURE: Culture: >=100000 — AB

## 2021-08-24 ENCOUNTER — Telehealth (HOSPITAL_BASED_OUTPATIENT_CLINIC_OR_DEPARTMENT_OTHER): Payer: Self-pay | Admitting: *Deleted

## 2021-08-24 NOTE — Telephone Encounter (Signed)
Post ED Visit - Positive Culture Follow-up ? ?Culture report reviewed by antimicrobial stewardship pharmacist: ?[facility] ?'[]'$  Elenor Quinones, Pharm.D. ?'[]'$  Heide Guile, Pharm.D., BCPS AQ-ID ?'[]'$  Parks Neptune, Pharm.D., BCPS ?'[]'$  Alycia Rossetti, Pharm.D., BCPS ?'[]'$  [place], Pharm.D., BCPS, AAHIVP ?'[]'$  Legrand Como, Pharm.D., BCPS, AAHIVP ?'[]'$  Salome Arnt, PharmD, BCPS ?'[]'$  Johnnette Gourd, PharmD, BCPS ?'[]'$  Hughes Better, PharmD, BCPS ?'[]'$  Leeroy Cha, PharmD ?'[]'$  Laqueta Linden, PharmD, BCPS ?'[x]'$  Albertina Parr, PharmD ? ?[facility] ?'[]'$  Leodis Sias, PharmD ?'[]'$  Lindell Spar, PharmD ?'[]'$  Royetta Asal, PharmD ?'[]'$  Graylin Shiver, Rph ?'[]'$  Rema Fendt) Glennon Mac, PharmD ?'[]'$  Arlyn Dunning, PharmD ?'[]'$  Netta Cedars, PharmD ?'[]'$  Dia Sitter, PharmD ?'[]'$  Leone Haven, PharmD ?'[]'$  Gretta Arab, PharmD ?'[]'$  Theodis Shove, PharmD ?'[]'$  Peggyann Juba, PharmD ?'[]'$  Reuel Boom, PharmD ? ? ?Positive urine culture ?Treated with Cefdinir, organism sensitive to the same and no further patient follow-up is required at this time. ? ?Rosie Fate ?08/24/2021, 11:03 AM ?  ?

## 2021-08-25 ENCOUNTER — Other Ambulatory Visit (HOSPITAL_BASED_OUTPATIENT_CLINIC_OR_DEPARTMENT_OTHER): Payer: Self-pay

## 2021-08-26 ENCOUNTER — Other Ambulatory Visit (HOSPITAL_BASED_OUTPATIENT_CLINIC_OR_DEPARTMENT_OTHER): Payer: Self-pay

## 2021-08-27 ENCOUNTER — Other Ambulatory Visit (HOSPITAL_BASED_OUTPATIENT_CLINIC_OR_DEPARTMENT_OTHER): Payer: Self-pay

## 2021-08-27 MED ORDER — BUSPIRONE HCL 10 MG PO TABS
ORAL_TABLET | ORAL | 0 refills | Status: DC
  Filled 2021-08-27: qty 270, 90d supply, fill #0

## 2021-08-29 ENCOUNTER — Telehealth: Payer: 59 | Admitting: Family Medicine

## 2021-08-29 DIAGNOSIS — B3731 Acute candidiasis of vulva and vagina: Secondary | ICD-10-CM | POA: Diagnosis not present

## 2021-08-29 MED ORDER — FLUCONAZOLE 150 MG PO TABS
ORAL_TABLET | ORAL | 0 refills | Status: DC
Start: 2021-08-29 — End: 2021-11-08

## 2021-08-29 NOTE — Progress Notes (Signed)

## 2021-09-01 ENCOUNTER — Other Ambulatory Visit (HOSPITAL_BASED_OUTPATIENT_CLINIC_OR_DEPARTMENT_OTHER): Payer: Self-pay

## 2021-09-02 ENCOUNTER — Other Ambulatory Visit (HOSPITAL_BASED_OUTPATIENT_CLINIC_OR_DEPARTMENT_OTHER): Payer: Self-pay

## 2021-09-02 MED ORDER — OXCARBAZEPINE 600 MG PO TABS
600.0000 mg | ORAL_TABLET | Freq: Every day | ORAL | 0 refills | Status: DC
  Filled 2021-09-02 – 2021-09-15 (×2): qty 90, 90d supply, fill #0

## 2021-09-02 MED ORDER — QELBREE 200 MG PO CP24
400.0000 mg | ORAL_CAPSULE | Freq: Every day | ORAL | 1 refills | Status: DC
  Filled 2021-09-02: qty 60, 30d supply, fill #0
  Filled 2021-10-01: qty 60, 30d supply, fill #1

## 2021-09-03 ENCOUNTER — Other Ambulatory Visit (HOSPITAL_BASED_OUTPATIENT_CLINIC_OR_DEPARTMENT_OTHER): Payer: Self-pay

## 2021-09-03 DIAGNOSIS — G47419 Narcolepsy without cataplexy: Secondary | ICD-10-CM | POA: Diagnosis not present

## 2021-09-03 DIAGNOSIS — G4719 Other hypersomnia: Secondary | ICD-10-CM | POA: Diagnosis not present

## 2021-09-04 DIAGNOSIS — G47419 Narcolepsy without cataplexy: Secondary | ICD-10-CM | POA: Diagnosis not present

## 2021-09-04 DIAGNOSIS — G4719 Other hypersomnia: Secondary | ICD-10-CM | POA: Diagnosis not present

## 2021-09-05 ENCOUNTER — Other Ambulatory Visit (HOSPITAL_BASED_OUTPATIENT_CLINIC_OR_DEPARTMENT_OTHER): Payer: Self-pay

## 2021-09-07 DIAGNOSIS — R197 Diarrhea, unspecified: Secondary | ICD-10-CM | POA: Diagnosis not present

## 2021-09-07 DIAGNOSIS — R1030 Lower abdominal pain, unspecified: Secondary | ICD-10-CM | POA: Diagnosis not present

## 2021-09-15 ENCOUNTER — Other Ambulatory Visit (HOSPITAL_BASED_OUTPATIENT_CLINIC_OR_DEPARTMENT_OTHER): Payer: Self-pay

## 2021-09-15 ENCOUNTER — Telehealth: Payer: 59 | Admitting: Physician Assistant

## 2021-09-15 DIAGNOSIS — R3989 Other symptoms and signs involving the genitourinary system: Secondary | ICD-10-CM

## 2021-09-15 MED ORDER — PRAZOSIN HCL 5 MG PO CAPS
ORAL_CAPSULE | ORAL | 0 refills | Status: DC
  Filled 2021-09-15: qty 90, 90d supply, fill #0

## 2021-09-15 MED ORDER — METFORMIN HCL ER 750 MG PO TB24
ORAL_TABLET | ORAL | 2 refills | Status: DC
Start: 2021-05-21 — End: 2021-12-24
  Filled 2021-09-15: qty 30, 30d supply, fill #0
  Filled 2021-10-21: qty 30, 30d supply, fill #1
  Filled 2021-11-18: qty 30, 30d supply, fill #2

## 2021-09-16 ENCOUNTER — Other Ambulatory Visit (HOSPITAL_BASED_OUTPATIENT_CLINIC_OR_DEPARTMENT_OTHER): Payer: Self-pay

## 2021-09-16 MED ORDER — NITROFURANTOIN MONOHYD MACRO 100 MG PO CAPS
100.0000 mg | ORAL_CAPSULE | Freq: Two times a day (BID) | ORAL | 0 refills | Status: DC
  Filled 2021-09-16: qty 10, 5d supply, fill #0

## 2021-09-16 NOTE — Progress Notes (Signed)

## 2021-09-16 NOTE — Progress Notes (Signed)
I have spent 5 minutes in review of e-visit questionnaire, review and updating patient chart, medical decision making and response to patient.   Jermery Caratachea Cody Aurea Aronov, PA-C    

## 2021-09-17 ENCOUNTER — Other Ambulatory Visit (HOSPITAL_BASED_OUTPATIENT_CLINIC_OR_DEPARTMENT_OTHER): Payer: Self-pay

## 2021-09-23 ENCOUNTER — Other Ambulatory Visit (HOSPITAL_BASED_OUTPATIENT_CLINIC_OR_DEPARTMENT_OTHER): Payer: Self-pay

## 2021-09-30 ENCOUNTER — Other Ambulatory Visit (HOSPITAL_BASED_OUTPATIENT_CLINIC_OR_DEPARTMENT_OTHER): Payer: Self-pay

## 2021-09-30 MED ORDER — GABAPENTIN 300 MG PO CAPS
ORAL_CAPSULE | ORAL | 0 refills | Status: DC
  Filled 2021-09-30: qty 90, 90d supply, fill #0

## 2021-09-30 MED ORDER — TOPIRAMATE 50 MG PO TABS
ORAL_TABLET | ORAL | 5 refills | Status: DC
  Filled 2021-09-30: qty 90, 30d supply, fill #0
  Filled 2021-11-18: qty 90, 30d supply, fill #1

## 2021-09-30 MED ORDER — PROCHLORPERAZINE MALEATE 10 MG PO TABS
ORAL_TABLET | ORAL | 2 refills | Status: DC
  Filled 2021-09-30 – 2021-10-01 (×2): qty 60, 20d supply, fill #0

## 2021-10-01 ENCOUNTER — Other Ambulatory Visit (HOSPITAL_BASED_OUTPATIENT_CLINIC_OR_DEPARTMENT_OTHER): Payer: Self-pay

## 2021-10-06 ENCOUNTER — Other Ambulatory Visit (HOSPITAL_BASED_OUTPATIENT_CLINIC_OR_DEPARTMENT_OTHER): Payer: Self-pay

## 2021-10-06 DIAGNOSIS — F250 Schizoaffective disorder, bipolar type: Secondary | ICD-10-CM | POA: Diagnosis not present

## 2021-10-06 DIAGNOSIS — F17200 Nicotine dependence, unspecified, uncomplicated: Secondary | ICD-10-CM | POA: Diagnosis not present

## 2021-10-06 DIAGNOSIS — G43009 Migraine without aura, not intractable, without status migrainosus: Secondary | ICD-10-CM | POA: Diagnosis not present

## 2021-10-06 DIAGNOSIS — G4719 Other hypersomnia: Secondary | ICD-10-CM | POA: Diagnosis not present

## 2021-10-06 MED ORDER — PROCHLORPERAZINE MALEATE 10 MG PO TABS: ORAL_TABLET | ORAL | 5 refills | Status: DC

## 2021-10-06 MED ORDER — CHLORZOXAZONE 500 MG PO TABS
ORAL_TABLET | ORAL | 5 refills | Status: DC
  Filled 2021-10-06: qty 90, 23d supply, fill #0
  Filled 2021-12-09: qty 90, 23d supply, fill #1

## 2021-10-07 ENCOUNTER — Other Ambulatory Visit (HOSPITAL_BASED_OUTPATIENT_CLINIC_OR_DEPARTMENT_OTHER): Payer: Self-pay

## 2021-10-07 DIAGNOSIS — E7849 Other hyperlipidemia: Secondary | ICD-10-CM | POA: Diagnosis not present

## 2021-10-07 DIAGNOSIS — M62838 Other muscle spasm: Secondary | ICD-10-CM | POA: Diagnosis not present

## 2021-10-07 DIAGNOSIS — R000 Tachycardia, unspecified: Secondary | ICD-10-CM | POA: Diagnosis not present

## 2021-10-07 MED ORDER — ATORVASTATIN CALCIUM 10 MG PO TABS
ORAL_TABLET | ORAL | 1 refills | Status: DC
  Filled 2021-10-21: qty 90, 90d supply, fill #0

## 2021-10-07 MED ORDER — CARVEDILOL 3.125 MG PO TABS
ORAL_TABLET | ORAL | 1 refills | Status: DC
  Filled 2021-10-21: qty 180, 90d supply, fill #0

## 2021-10-07 MED ORDER — TIZANIDINE HCL 2 MG PO TABS
ORAL_TABLET | ORAL | 0 refills | Status: DC
  Filled 2021-10-07: qty 30, 30d supply, fill #0

## 2021-10-13 ENCOUNTER — Other Ambulatory Visit (HOSPITAL_BASED_OUTPATIENT_CLINIC_OR_DEPARTMENT_OTHER): Payer: Self-pay

## 2021-10-17 ENCOUNTER — Other Ambulatory Visit (HOSPITAL_BASED_OUTPATIENT_CLINIC_OR_DEPARTMENT_OTHER): Payer: Self-pay

## 2021-10-17 MED ORDER — AIMOVIG 140 MG/ML ~~LOC~~ SOAJ
SUBCUTANEOUS | 3 refills | Status: DC
  Filled 2021-10-17: qty 1, 30d supply, fill #0
  Filled 2021-12-19: qty 1, 30d supply, fill #1
  Filled 2022-03-04: qty 1, 30d supply, fill #2

## 2021-10-21 ENCOUNTER — Other Ambulatory Visit (HOSPITAL_BASED_OUTPATIENT_CLINIC_OR_DEPARTMENT_OTHER): Payer: Self-pay

## 2021-10-22 ENCOUNTER — Other Ambulatory Visit (HOSPITAL_BASED_OUTPATIENT_CLINIC_OR_DEPARTMENT_OTHER): Payer: Self-pay

## 2021-10-22 MED ORDER — CITALOPRAM HYDROBROMIDE 20 MG PO TABS
20.0000 mg | ORAL_TABLET | Freq: Every day | ORAL | 0 refills | Status: DC
  Filled 2021-10-22: qty 90, 90d supply, fill #0

## 2021-10-31 ENCOUNTER — Other Ambulatory Visit (HOSPITAL_BASED_OUTPATIENT_CLINIC_OR_DEPARTMENT_OTHER): Payer: Self-pay

## 2021-10-31 DIAGNOSIS — R7303 Prediabetes: Secondary | ICD-10-CM | POA: Diagnosis not present

## 2021-10-31 DIAGNOSIS — F902 Attention-deficit hyperactivity disorder, combined type: Secondary | ICD-10-CM | POA: Diagnosis not present

## 2021-10-31 DIAGNOSIS — E661 Drug-induced obesity: Secondary | ICD-10-CM | POA: Diagnosis not present

## 2021-10-31 DIAGNOSIS — F250 Schizoaffective disorder, bipolar type: Secondary | ICD-10-CM | POA: Diagnosis not present

## 2021-10-31 DIAGNOSIS — G47419 Narcolepsy without cataplexy: Secondary | ICD-10-CM | POA: Diagnosis not present

## 2021-10-31 DIAGNOSIS — F410 Panic disorder [episodic paroxysmal anxiety] without agoraphobia: Secondary | ICD-10-CM | POA: Diagnosis not present

## 2021-10-31 DIAGNOSIS — F4310 Post-traumatic stress disorder, unspecified: Secondary | ICD-10-CM | POA: Diagnosis not present

## 2021-10-31 MED ORDER — QELBREE 200 MG PO CP24
ORAL_CAPSULE | ORAL | 1 refills | Status: DC
  Filled 2021-10-31 – 2021-12-31 (×2): qty 60, 30d supply, fill #0
  Filled 2022-01-26 (×2): qty 60, 30d supply, fill #1

## 2021-10-31 MED ORDER — QELBREE 200 MG PO CP24
ORAL_CAPSULE | ORAL | 1 refills | Status: DC
  Filled 2021-10-31: qty 60, 30d supply, fill #0
  Filled 2021-11-26: qty 60, 30d supply, fill #1

## 2021-10-31 MED ORDER — PALIPERIDONE ER 3 MG PO TB24
ORAL_TABLET | ORAL | 0 refills | Status: DC
Start: 2021-10-31 — End: 2021-11-11
  Filled 2021-10-31: qty 30, 30d supply, fill #0

## 2021-10-31 MED ORDER — CLONAZEPAM 0.5 MG PO TABS
ORAL_TABLET | ORAL | 0 refills | Status: DC
  Filled 2021-10-31: qty 90, 90d supply, fill #0

## 2021-11-03 ENCOUNTER — Other Ambulatory Visit (HOSPITAL_BASED_OUTPATIENT_CLINIC_OR_DEPARTMENT_OTHER): Payer: Self-pay

## 2021-11-05 ENCOUNTER — Other Ambulatory Visit (HOSPITAL_COMMUNITY): Payer: Self-pay

## 2021-11-05 MED ORDER — IBUPROFEN 800 MG PO TABS
800.0000 mg | ORAL_TABLET | Freq: Three times a day (TID) | ORAL | 0 refills | Status: DC | PRN
Start: 2021-11-05 — End: 2021-11-21
  Filled 2021-11-05: qty 24, 8d supply, fill #0

## 2021-11-05 MED ORDER — CLINDAMYCIN HCL 300 MG PO CAPS
300.0000 mg | ORAL_CAPSULE | Freq: Three times a day (TID) | ORAL | 0 refills | Status: DC
  Filled 2021-11-05: qty 21, 7d supply, fill #0

## 2021-11-07 ENCOUNTER — Emergency Department (HOSPITAL_BASED_OUTPATIENT_CLINIC_OR_DEPARTMENT_OTHER)
Admission: EM | Admit: 2021-11-07 | Discharge: 2021-11-07 | Disposition: A | Discharge status: Home / Self Care | Payer: 59 | Attending: Emergency Medicine | Admitting: Emergency Medicine

## 2021-11-07 ENCOUNTER — Other Ambulatory Visit: Payer: Self-pay

## 2021-11-07 ENCOUNTER — Encounter (HOSPITAL_BASED_OUTPATIENT_CLINIC_OR_DEPARTMENT_OTHER): Payer: Self-pay

## 2021-11-07 DIAGNOSIS — K0889 Other specified disorders of teeth and supporting structures: Secondary | ICD-10-CM | POA: Diagnosis not present

## 2021-11-07 DIAGNOSIS — Z79899 Other long term (current) drug therapy: Secondary | ICD-10-CM | POA: Insufficient documentation

## 2021-11-07 DIAGNOSIS — Z7901 Long term (current) use of anticoagulants: Secondary | ICD-10-CM | POA: Diagnosis not present

## 2021-11-07 LAB — CBC WITH DIFFERENTIAL/PLATELET
Abs Immature Granulocytes: 0.02 10*3/uL (ref 0.00–0.07)
Basophils Absolute: 0.1 10*3/uL (ref 0.0–0.1)
Basophils Relative: 1 %
Eosinophils Absolute: 0.5 10*3/uL (ref 0.0–0.5)
Eosinophils Relative: 5 %
HCT: 35.1 % — ABNORMAL LOW (ref 36.0–46.0)
Hemoglobin: 12.2 g/dL (ref 12.0–15.0)
Immature Granulocytes: 0 %
Lymphocytes Relative: 31 %
Lymphs Abs: 3.3 10*3/uL (ref 0.7–4.0)
MCH: 29.4 pg (ref 26.0–34.0)
MCHC: 34.8 g/dL (ref 30.0–36.0)
MCV: 84.6 fL (ref 80.0–100.0)
Monocytes Absolute: 0.7 10*3/uL (ref 0.1–1.0)
Monocytes Relative: 7 %
Neutro Abs: 6 10*3/uL (ref 1.7–7.7)
Neutrophils Relative %: 56 %
Platelets: 354 10*3/uL (ref 150–400)
RBC: 4.15 MIL/uL (ref 3.87–5.11)
RDW: 12.8 % (ref 11.5–15.5)
WBC: 10.6 10*3/uL — ABNORMAL HIGH (ref 4.0–10.5)
nRBC: 0 % (ref 0.0–0.2)

## 2021-11-07 LAB — BASIC METABOLIC PANEL
Anion gap: 9 (ref 5–15)
BUN: 11 mg/dL (ref 6–20)
CO2: 24 mmol/L (ref 22–32)
Calcium: 9.4 mg/dL (ref 8.9–10.3)
Chloride: 102 mmol/L (ref 98–111)
Creatinine, Ser: 0.7 mg/dL (ref 0.44–1.00)
GFR, Estimated: >60 mL/min (ref >60)
Glucose, Bld: 97 mg/dL (ref 70–99)
Potassium: 3.6 mmol/L (ref 3.5–5.1)
Sodium: 135 mmol/L (ref 135–145)

## 2021-11-07 MED ORDER — ONDANSETRON 4 MG PO TBDP: 4.0000 mg | ORAL_TABLET | Freq: Three times a day (TID) | ORAL | 0 refills | Status: DC | PRN

## 2021-11-07 NOTE — ED Provider Notes (Signed)
[facility] DEPARTMENT Provider Note   CSN: [id number] Arrival date & time: 11/07/21  1709     History  Chief Complaint  Patient presents with   Dental Pain    Allison Cunningham is a 41 y.o. female.  Patient complains of pain to left lower gum into a tooth left lower jaw.  Patient is on clindamycin.  Patient reports she is concerned that she has infection in her body because she had a fever earlier today.  Patient complains of some nausea from clindamycin.  Patient is scheduled to have a dental extraction in August.  She called her dentist today and was instructed to come to the emergency department.  The history is provided by the patient. No language interpreter was used.  Dental Pain Location:  Lower Lower teeth location:  23/LL lateral incisor Quality:  Aching Severity:  Moderate Onset quality:  Gradual Timing:  Constant Chronicity:  New Worsened by:  Nothing Ineffective treatments:  Acetaminophen Risk factors: no alcohol problem        Home Medications Prior to Admission medications   Medication Sig Start Date End Date Taking? Authorizing Provider  ondansetron (ZOFRAN-ODT) 4 MG disintegrating tablet Take 1 tablet (4 mg total) by mouth every 8 (eight) hours as needed for nausea or vomiting. 11/07/21  Yes Fransico Meadow, PA-C  acetaminophen (TYLENOL) 325 MG tablet Take 650 mg by mouth every 6 (six) hours as needed. Patient used this medication for pain.    [provider]  albuterol (VENTOLIN HFA) 108 (90 Base) MCG/ACT inhaler INHALE 2 PUFFS BY MOUTH INTO THE LUNGS EVERY 4 TO 6 HOURS AS NEEDED FOR WHEEZING OR SHORTNESS OF BREATH. 01/30/20 01/29/21  Joneen Boers, MD  albuterol (VENTOLIN HFA) 108 (90 Base) MCG/ACT inhaler Inhale 2 puffs into the lungs every 4 (four) hours as needed. 12/18/20     apixaban (ELIQUIS) 5 MG TABS tablet TAKE 1 TABLET BY MOUTH TWICE DAILY 06/17/20 06/17/21  Margarito Courser, MD  APIXABAN Arne [place]) VTE STARTER PACK ('10MG'$  AND '5MG'$ )  TAKE TWO '5MG'$  TABLETS BY MOUTH 2 TIMES DAILY FOR 1 WEEK THEN ONE '5MG'$  TABLET 2 TIMES DAILY THEREAFTER 05/24/20 05/24/21  Raspet, Derry Skill, PA-C  Armodafinil 150 MG tablet Take one tablet (150 mg dose) by mouth daily. 07/15/21     Armodafinil 50 MG tablet Take two tablets (100 mg dose) by mouth daily. 09/27/20     atorvastatin (LIPITOR) 10 MG tablet Take one tablet (10 mg dose) by mouth at bedtime. 10/07/21     baclofen (LIORESAL) 10 MG tablet Take one tablet (10 mg dose) by mouth 3 (three) times a day as needed (Muscle spasm, muscle pain). 05/20/21     Biotin 5000 MCG TABS Take 1 tablet by mouth daily.    [provider]  Brexpiprazole 0.25 MG TABS Take by mouth. 04/09/17   [provider]  busPIRone (BUSPAR) 10 MG tablet Take 10 mg by mouth 2 (two) times daily. 04/15/18   [provider]  busPIRone (BUSPAR) 10 MG tablet Take 1 tablet by mouth 3 times a day 11/08/20     busPIRone (BUSPAR) 10 MG tablet Take 1 tablet (10 mg total) by mouth 3 (three) times daily. 02/14/21     busPIRone (BUSPAR) 10 MG tablet Take 1 tablet by mouth 3 times a day 05/21/21     busPIRone (BUSPAR) 10 MG tablet Take 1 tablet by mouth 3 times a day 05/21/21     carvedilol (COREG) 3.125 MG tablet Take  3.125 mg by mouth 2 (two) times daily. 12/25/19   [provider]  carvedilol (COREG) 3.125 MG tablet Take one tablet (3.125 mg dose) by mouth 2 (two) times daily. 10/07/21     cefdinir (OMNICEF) 300 MG capsule Take 1 capsule (300 mg total) by mouth 2 (two) times daily. 08/20/21   Carlisle Cater, PA-C  cetirizine (ZYRTEC) 10 MG tablet Take 10 mg by mouth daily.    [provider]  chlorhexidine (PERIDEX) 0.12 % solution Swish and spit 15 mLs 2 (two) times daily for 7 days. 05/02/21     chlorzoxazone (PARAFON) 500 MG tablet One tablet as needed up to four times daily for headache 02/19/20   [provider]  chlorzoxazone (PARAFON) 500 MG tablet Take 1 tablet by mouth up to four times daily for headache  07/29/20     chlorzoxazone (PARAFON) 500 MG tablet Take 1 tablet by mouth as needed up to four times daily for headache 10/06/21     citalopram (CELEXA) 10 MG tablet Take 10 mg by mouth daily. 05/16/18   [provider]  citalopram (CELEXA) 20 MG tablet TAKE 1 TABLET BY MOUTH AT BEDTIME 07/09/20 07/09/21  Elsie Stain, PA  citalopram (CELEXA) 20 MG tablet TAKE 1 TABLET BY MOUTH EVERY NIGHT AT BEDTIME 04/09/20 04/09/21  Elsie Stain, PA  citalopram (CELEXA) 20 MG tablet TAKE 1 TABLET BY MOUTH AT BEDTIME 12/20/19 12/19/20  Elsie Stain, PA  citalopram (CELEXA) 20 MG tablet Take 1 tablet by mouth at bedtime 01/14/21     citalopram (CELEXA) 20 MG tablet Take 1 tablet by mouth at bedtime 07/15/21     citalopram (CELEXA) 20 MG tablet Take 1 tablet (20 mg total) by mouth at bedtime. 07/15/21     clindamycin (CLEOCIN) 300 MG capsule Take 1 capsule (300 mg total) by mouth every 8 (eight) hours until finished 11/05/21     clonazePAM (KLONOPIN) 0.5 MG tablet Take 0.5 mg by mouth 2 (two) times daily.    [provider]  clonazePAM (KLONOPIN) 0.5 MG tablet TAKE 1/2 TABLET BY MOUTH DAILY 04/09/20 10/06/20  Elsie Stain, PA  clonazePAM (KLONOPIN) 0.5 MG tablet Take 1/2 tablet (0.25 mg total) by mouth daily. 02/14/21     clonazePAM (KLONOPIN) 0.5 MG tablet Take 1 tablet by mouth daily 07/15/21     clonazePAM (KLONOPIN) 0.5 MG tablet Take 1 po daily 10/31/21     cyclobenzaprine (FLEXERIL) 10 MG tablet Take one tablet (10 mg dose) by mouth 2 (two) times a day as needed for Muscle spasms for up to 10 days. 05/14/21     diclofenac (VOLTAREN) 75 MG EC tablet Take 75 mg by mouth 2 (two) times daily. 10/09/19   [provider]  diclofenac (VOLTAREN) 75 MG EC tablet Take 1 tablet (75 mg total) by mouth 2 (two) times daily. 04/18/20   Wallene Huh, DPM  dicyclomine (BENTYL) 20 MG tablet Take one tablet (20 mg dose) by mouth 2 (two) times daily. 07/31/20     Erenumab-aooe (AIMOVIG) 140 MG/ML SOAJ Inject 140 mg into  the skin every 30 (thirty) days. 05/21/21     Erenumab-aooe (AIMOVIG) 140 MG/ML SOAJ Inject 140 mg into the skin every 30 (thirty) days. 06/16/21     Erenumab-aooe (AIMOVIG) 140 MG/ML SOAJ Inject 140 mg into the skin every 30 (thirty) days. 10/17/21     fenofibrate 160 MG tablet TAKE ONE TABLET (160 MG DOSE) BY MOUTH DAILY. 05/20/20 05/20/21  Margarito Courser, MD  fenofibrate 160  MG tablet Take one tablet (160 mg dose) by mouth daily. 08/01/21     fluconazole (DIFLUCAN) 150 MG tablet SMARTSIG:1 Tablet(s) By Mouth 12/04/19   [provider]  fluconazole (DIFLUCAN) 150 MG tablet Take one now and repeat in 1 week. 08/29/21   Tempie Hoist, FNP  fluticasone (FLONASE) 50 MCG/ACT nasal spray Place 2 sprays into both nostrils daily. 09/05/20   Brunetta Jeans, PA-C  fluticasone [facility]) 50 MCG/ACT nasal spray Place 1 spray into the nose daily 10/08/20     fluticasone (FLONASE) 50 MCG/ACT nasal spray Place 1 spray into both nostrils daily. 12/18/20     gabapentin (NEURONTIN) 100 MG capsule Take one capsule (100 mg dose) by mouth at bedtime for 1 week and then increase to 2 capsules at bedtime for 1 week and then increase to 3 capsules at bedtime to continue 05/20/21     gabapentin (NEURONTIN) 300 MG capsule Take one capsule (300 mg dose) by mouth at bedtime. 09/30/21     HYDROcodone-acetaminophen (NORCO/VICODIN) 5-325 MG tablet Take one tablet by mouth every 4 (four) hours as needed for Pain for up to 3 days. 07/17/20     hydrOXYzine (ATARAX) 50 MG tablet Take 1 by mouth at bedtime 05/21/21     hydrOXYzine (ATARAX/VISTARIL) 25 MG tablet Take by mouth. 09/08/19   [provider]  hydrOXYzine (ATARAX/VISTARIL) 50 MG tablet Take 1 tablet by mouth at bedtime 11/08/20     hydrOXYzine (ATARAX/VISTARIL) 50 MG tablet Take 1 tablet (50 mg total) by mouth at bedtime. 02/14/21     ibuprofen (ADVIL) 800 MG tablet Take 1 tablet (800 mg total) by mouth every 8 (eight) hours with food as needed for pain 11/05/21     ketorolac  (TORADOL) 10 MG tablet Take one tablet (10 mg dose) by mouth every 6 (six) hours as needed. 07/17/20     meloxicam (MOBIC) 15 MG tablet Take one tablet (15 mg dose) by mouth daily. Take with meals 05/20/21     metFORMIN (GLUCOPHAGE-XR) 500 MG 24 hr tablet TAKE 1 TABLET BY MOUTH DAILY WITH FOOD 04/09/20 04/09/21  Elsie Stain, PA  metFORMIN (GLUCOPHAGE-XR) 500 MG 24 hr tablet TAKE 1 TABLET BY MOUTH ONCE DAILY WITH FOOD 03/18/20 03/18/21  Elsie Stain, PA  metFORMIN (GLUCOPHAGE-XR) 500 MG 24 hr tablet TAKE 1 TABLET BY MOUTH ONCE DAILY WITH FOOD 01/19/20 01/18/21  Elsie Stain, PA  metFORMIN (GLUCOPHAGE-XR) 750 MG 24 hr tablet TAKE 1 TABLET BY MOUTH ONCE DAILY 06/10/20 06/10/21  Elsie Stain, PA  metFORMIN (GLUCOPHAGE-XR) 750 MG 24 hr tablet Take 1 tablet by mouth daily 05/21/21     modafinil (PROVIGIL) 200 MG tablet Take 300 mg by mouth every morning. 09/17/19   [provider]  modafinil (PROVIGIL) 200 MG tablet TAKE 1 & 1/2 TABLETS BY MOUTH EVERY MORNING 05/20/20 11/16/20  Rose, Crystal, PA-C  modafinil (PROVIGIL) 200 MG tablet TAKE 1 & 1/2 TABLETS BY MOUTH EVERY MORNING **NEEDS OFFICE VISIT FOR FURTHER REFILLS** 03/12/20 09/08/20  Rose, Crystal, PA-C  modafinil (PROVIGIL) 200 MG tablet TAKE 1 & 1/2 TABLETS BY MOUTH EVERY MORNING **NEEDS OFFICE VISIT FOR FURTHER REFILLS** 07/18/20     nitrofurantoin, macrocrystal-monohydrate, (MACROBID) 100 MG capsule Take 1 capsule (100 mg total) by mouth 2 (two) times daily. 09/16/21   Brunetta Jeans, PA-C  Norgestimate-Ethinyl Estradiol Triphasic 0.18/0.215/0.25 MG-25 MCG tab TAKE 1 TABLET BY MOUTH ONCE DAILY 01/16/20 01/15/21  Kang-Oh, Denny Peon E, DO  omeprazole (PRILOSEC) 40 MG capsule TAKE 1 CAPSULE (  40 MG TOTAL) BY MOUTH DAILY. 01/15/20 01/14/21  Evelina Dun A, FNP  omeprazole (PRILOSEC) 40 MG capsule Take 1 capsule by mouth daily. 08/19/21     ondansetron (ZOFRAN) 4 MG tablet Take 1 tablet (4 mg total) by mouth every 8 (eight) hours as needed for nausea or vomiting. 03/13/20    Wallene Huh, DPM  Oxcarbazepine (TRILEPTAL) 300 MG tablet TAKE 1 TABLET BY MOUTH AT BEDTIME 06/10/20 06/10/21  Elsie Stain, PA  oxcarbazepine (TRILEPTAL) 600 MG tablet Take 1 tablet by mouth daily every night at bedtime 08/05/20     oxcarbazepine (TRILEPTAL) 600 MG tablet Take 1 tablet by mouth every night at bedtime 08/05/20     oxcarbazepine (TRILEPTAL) 600 MG tablet Take 1 tablet by mouth once every night at bedtime 02/11/21     oxcarbazepine (TRILEPTAL) 600 MG tablet Take 1 tablet (600 mg total) by mouth at bedtime. 05/21/21     paliperidone (INVEGA SUSTENNA) 156 MG/ML SUSY injection USE ONCE A MONTH INTRAMUSCULAR INJECTION 06/10/20 06/10/21  Elsie Stain, PA  paliperidone (INVEGA SUSTENNA) 156 MG/ML SUSY injection INJECT INTO THE MUSCLE ONCE MONTHLY 05/06/20 05/06/21  Elsie Stain, PA  paliperidone (INVEGA SUSTENNA) 156 MG/ML SUSY injection INJECT 1 INJECTION ONCE A MONTH INTRAMUSCULARLY 04/02/20 04/02/21  Elsie Stain, PA  paliperidone (INVEGA SUSTENNA) 156 MG/ML SUSY injection INJECT ONCE A MONTH 02/29/20 02/28/21  Elsie Stain, PA  paliperidone (INVEGA SUSTENNA) 156 MG/ML SUSY injection USE AS DIRECTED ONCE A MONTH INJECTION INTO THE MUSCLE 01/02/20 01/01/21  Elsie Stain, PA  paliperidone (INVEGA SUSTENNA) 156 MG/ML SUSY injection Inject 11m once monthly as directed 08/05/20     paliperidone (INVEGA SUSTENNA) 156 MG/ML SUSY injection Inject into the muscle once monthly 08/05/20     paliperidone (INVEGA SUSTENNA) 156 MG/ML SUSY injection Use as directed once a month into the muscle injection 07/15/21     paliperidone (INVEGA) 3 MG 24 hr tablet Take by mouth. 03/29/17   [provider]  paliperidone (INVEGA) 3 MG 24 hr tablet Take 1 tablet by mouth at bedtime 10/31/21     Paliperidone Palmitate ER (INVEGA TRINZA) 546 MG/1.75ML SUSY Inject once into the muscle every 3 months 02/10/21     Paliperidone Palmitate ER (INVEGA TRINZA) 546 MG/1.75ML SUSY Take 1 injection Intramuscularly every 3  months 05/12/21     prazosin (MINIPRESS) 2 MG capsule Take 1 tablet by mouth at bedtime 05/21/21     prazosin (MINIPRESS) 5 MG capsule Take 1 tablet by mouth at bedtime 06/19/21     predniSONE (DELTASONE) 20 MG tablet Take two tablets (40 mg dose) by mouth daily for 5 days. 10/18/20     prochlorperazine (COMPAZINE) 10 MG tablet Take one tablet (10 mg dose) by mouth every 8 (eight) hours as needed. 09/30/21     prochlorperazine (COMPAZINE) 10 MG tablet Take one tablet (10 mg dose) by mouth every 8 (eight) hours as needed. 10/06/21     promethazine-dextromethorphan (PROMETHAZINE-DM) 6.25-15 MG/5ML syrup Take 5 mLs by mouth 4 (four) times a day as needed for up to 7 days. 03/05/21     SUMAtriptan (IMITREX) 100 MG tablet Take 1 tablet by mouth at onset of headache. May repeat in 2 hours if headache pain persists. **Max of 2 tablets in 24 hours** 07/29/20     SUMAtriptan (IMITREX) 100 MG tablet One tablet at the onset of headache.  May repeat in two hours if the headache pain persists.  No more than 2 tablets in a 24 hour period. 05/21/21  tiZANidine (ZANAFLEX) 2 MG tablet Take one tablet (2 mg dose) by mouth at bedtime. 10/07/21     topiramate (TOPAMAX) 100 MG tablet TAKE 1 TABLET BY MOUTH EVERY NIGHT AT BEDTIME 11/02/19 11/01/20  Preston Fleeting, FNP  topiramate (TOPAMAX) 100 MG tablet TAKE 1 TABLET BY MOUTH EVERY NIGHT AT BEDTIME 10/30/20     topiramate (TOPAMAX) 100 MG tablet Take 1 tablet (100 mg total) by mouth at bedtime. 10/30/20     topiramate (TOPAMAX) 25 MG tablet Take one tablet at bedtime for one week, then two tablets at bedtime for one week, then three tablets at bedtime 07/07/19   [provider]  topiramate (TOPAMAX) 50 MG tablet Take three tablets (150 mg dose) by mouth at bedtime. 09/30/21     TRI-LO-MARZIA 0.18/0.215/0.25 MG-25 MCG tab Take 1 tablet by mouth daily. 01/17/18   [provider]  triamcinolone cream (KENALOG) 0.1 % Apply on to the skin 2 times daily as needed 09/16/20      valbenazine (INGREZZA) 80 MG capsule Take one capsule by mouth daily 04/25/21     Valbenazine Tosylate 80 MG CAPS Take by mouth. 01/07/17   [provider]  viloxazine ER (QELBREE) 200 MG 24 hr capsule Take 2 capsules (400 mg total) by mouth daily. 04/25/21     viloxazine ER (QELBREE) 200 MG 24 hr capsule Take 2 capsules (400 mg total) by mouth daily. 10/31/21     viloxazine ER (QELBREE) 200 MG 24 hr capsule Take 2 capsules (400 mg total) by mouth daily. 10/31/21         Allergies    Lamotrigine, Cephalexin, Clarithromycin, Amoxicillin-pot clavulanate, and Penicillins    Review of Systems   Review of Systems  All other systems reviewed and are negative.   Physical Exam Updated Vital Signs BP 117/81   Pulse 80   Temp 99.1 F (37.3 C) (Oral)   Resp 18   Ht '5\' 4"'$  (1.626 m)   Wt 98 kg   SpO2 97%   BMI 37.08 kg/m  Physical Exam Vitals and nursing note reviewed.  Constitutional:      Appearance: She is well-developed.  HENT:     Head: Normocephalic.     Nose: Nose normal.     Mouth/Throat:     Comments: No sign of abscess Cardiovascular:     Rate and Rhythm: Normal rate.  Pulmonary:     Effort: Pulmonary effort is normal.  Abdominal:     General: There is no distension.  Musculoskeletal:        General: Normal range of motion.     Cervical back: Normal range of motion.  Skin:    General: Skin is warm.  Neurological:     General: No focal deficit present.     Mental Status: She is alert and oriented to person, place, and time.  Psychiatric:        Mood and Affect: Mood normal.     ED Results / Procedures / Treatments   Labs (all labs ordered are listed, but only abnormal results are displayed) Labs Reviewed  CBC WITH DIFFERENTIAL/PLATELET - Abnormal; Notable for the following components:      Result Value   WBC 10.6 (*)    HCT 35.1 (*)    All other components within normal limits  BASIC METABOLIC PANEL    EKG None  Radiology No results  found.  Procedures Procedures    Medications Ordered in ED Medications - No data to display  ED Course/ Medical Decision Making/ A&P                           Medical Decision Making Patient complains of pain to her tooth and fever  Amount and/or Complexity of Data Reviewed Labs: ordered. Decision-making details documented in ED Course.    Details: Labs ordered reviewed and interpreted patient has a white blood cell count of 10.6  Risk Prescription drug management. Risk Details: Gave patient a prescription for Zofran she is advised to continue clindamycin she is to call her dentist on Monday to see if she can be evaluated sooner           Final Clinical Impression(s) / ED Diagnoses Final diagnoses:  Toothache    Rx / DC Orders ED Discharge Orders          Ordered    ondansetron (ZOFRAN-ODT) 4 MG disintegrating tablet  Every 8 hours PRN        11/07/21 1935           An After Visit Summary was printed and given to the patient.    Fransico Meadow, Hershal Coria 11/07/21 2309    Margette Fast, MD 11/09/21 [phone]

## 2021-11-07 NOTE — ED Notes (Signed)
Dc instructions and scripts reviewed with pt no questions or concerns at this time. Will follow up with dentist in the morning.

## 2021-11-07 NOTE — ED Triage Notes (Signed)
Patient c/o left sided dental pain x months. States she has been diagnosed with a dental infection but doesn't feel the ABX are working. Waiting for an extraction x 8/10

## 2021-11-07 NOTE — Discharge Instructions (Addendum)
Continue clindamycin,  Take nausea medication before antibiotic  Call your dentist on Monday to schedule follow up

## 2021-11-08 ENCOUNTER — Telehealth: Payer: Self-pay | Admitting: Urgent Care

## 2021-11-08 DIAGNOSIS — B3731 Acute candidiasis of vulva and vagina: Secondary | ICD-10-CM

## 2021-11-08 MED ORDER — FLUCONAZOLE 150 MG PO TABS
150.0000 mg | ORAL_TABLET | Freq: Once | ORAL | 0 refills | Status: AC
  Filled 2021-11-08: qty 2, 2d supply, fill #0

## 2021-11-08 NOTE — Progress Notes (Signed)

## 2021-11-09 ENCOUNTER — Other Ambulatory Visit (HOSPITAL_BASED_OUTPATIENT_CLINIC_OR_DEPARTMENT_OTHER): Payer: Self-pay

## 2021-11-10 ENCOUNTER — Other Ambulatory Visit (HOSPITAL_BASED_OUTPATIENT_CLINIC_OR_DEPARTMENT_OTHER): Payer: Self-pay

## 2021-11-11 ENCOUNTER — Other Ambulatory Visit (HOSPITAL_BASED_OUTPATIENT_CLINIC_OR_DEPARTMENT_OTHER): Payer: Self-pay

## 2021-11-11 MED ORDER — PALIPERIDONE ER 3 MG PO TB24
3.0000 mg | ORAL_TABLET | Freq: Every day | ORAL | 1 refills | Status: DC
  Filled 2021-11-11 – 2021-12-19 (×2): qty 30, 30d supply, fill #0
  Filled 2022-02-18: qty 30, 30d supply, fill #1

## 2021-11-19 ENCOUNTER — Other Ambulatory Visit (HOSPITAL_BASED_OUTPATIENT_CLINIC_OR_DEPARTMENT_OTHER): Payer: Self-pay

## 2021-11-20 ENCOUNTER — Other Ambulatory Visit (HOSPITAL_BASED_OUTPATIENT_CLINIC_OR_DEPARTMENT_OTHER): Payer: Self-pay

## 2021-11-20 MED ORDER — CLINDAMYCIN HCL 300 MG PO CAPS
ORAL_CAPSULE | ORAL | 0 refills | Status: DC
  Filled 2021-11-20: qty 28, 9d supply, fill #0

## 2021-11-21 ENCOUNTER — Telehealth: Payer: 59 | Admitting: Physician Assistant

## 2021-11-21 DIAGNOSIS — K0889 Other specified disorders of teeth and supporting structures: Secondary | ICD-10-CM | POA: Diagnosis not present

## 2021-11-21 MED ORDER — IBUPROFEN 800 MG PO TABS: 800.0000 mg | ORAL_TABLET | Freq: Three times a day (TID) | ORAL | 0 refills | Status: DC | PRN

## 2021-11-21 NOTE — Progress Notes (Signed)

## 2021-11-23 ENCOUNTER — Emergency Department (HOSPITAL_BASED_OUTPATIENT_CLINIC_OR_DEPARTMENT_OTHER): Payer: 59

## 2021-11-23 ENCOUNTER — Other Ambulatory Visit: Payer: Self-pay

## 2021-11-23 ENCOUNTER — Emergency Department (HOSPITAL_BASED_OUTPATIENT_CLINIC_OR_DEPARTMENT_OTHER)
Admission: EM | Admit: 2021-11-23 | Discharge: 2021-11-23 | Disposition: A | Discharge status: Home / Self Care | Payer: 59 | Attending: Emergency Medicine | Admitting: Emergency Medicine

## 2021-11-23 ENCOUNTER — Encounter (HOSPITAL_BASED_OUTPATIENT_CLINIC_OR_DEPARTMENT_OTHER): Payer: Self-pay | Admitting: Emergency Medicine

## 2021-11-23 DIAGNOSIS — Z7984 Long term (current) use of oral hypoglycemic drugs: Secondary | ICD-10-CM | POA: Diagnosis not present

## 2021-11-23 DIAGNOSIS — R0789 Other chest pain: Secondary | ICD-10-CM | POA: Diagnosis not present

## 2021-11-23 DIAGNOSIS — Z7901 Long term (current) use of anticoagulants: Secondary | ICD-10-CM | POA: Diagnosis not present

## 2021-11-23 DIAGNOSIS — R221 Localized swelling, mass and lump, neck: Secondary | ICD-10-CM | POA: Diagnosis not present

## 2021-11-23 DIAGNOSIS — R42 Dizziness and giddiness: Secondary | ICD-10-CM | POA: Diagnosis not present

## 2021-11-23 DIAGNOSIS — M542 Cervicalgia: Secondary | ICD-10-CM | POA: Diagnosis not present

## 2021-11-23 DIAGNOSIS — K0889 Other specified disorders of teeth and supporting structures: Secondary | ICD-10-CM | POA: Diagnosis not present

## 2021-11-23 DIAGNOSIS — K047 Periapical abscess without sinus: Secondary | ICD-10-CM | POA: Diagnosis not present

## 2021-11-23 DIAGNOSIS — R079 Chest pain, unspecified: Secondary | ICD-10-CM | POA: Diagnosis not present

## 2021-11-23 DIAGNOSIS — R112 Nausea with vomiting, unspecified: Secondary | ICD-10-CM | POA: Diagnosis not present

## 2021-11-23 DIAGNOSIS — R509 Fever, unspecified: Secondary | ICD-10-CM | POA: Diagnosis not present

## 2021-11-23 DIAGNOSIS — R059 Cough, unspecified: Secondary | ICD-10-CM | POA: Diagnosis not present

## 2021-11-23 LAB — CBC WITH DIFFERENTIAL/PLATELET
Abs Immature Granulocytes: 0.02 10*3/uL (ref 0.00–0.07)
Basophils Absolute: 0.1 10*3/uL (ref 0.0–0.1)
Basophils Relative: 1 %
Eosinophils Absolute: 0.5 10*3/uL (ref 0.0–0.5)
Eosinophils Relative: 7 %
HCT: 35.1 % — ABNORMAL LOW (ref 36.0–46.0)
Hemoglobin: 12 g/dL (ref 12.0–15.0)
Immature Granulocytes: 0 %
Lymphocytes Relative: 32 %
Lymphs Abs: 2.3 10*3/uL (ref 0.7–4.0)
MCH: 29.3 pg (ref 26.0–34.0)
MCHC: 34.2 g/dL (ref 30.0–36.0)
MCV: 85.8 fL (ref 80.0–100.0)
Monocytes Absolute: 0.6 10*3/uL (ref 0.1–1.0)
Monocytes Relative: 8 %
Neutro Abs: 3.7 10*3/uL (ref 1.7–7.7)
Neutrophils Relative %: 52 %
Platelets: 371 10*3/uL (ref 150–400)
RBC: 4.09 MIL/uL (ref 3.87–5.11)
RDW: 12.8 % (ref 11.5–15.5)
WBC: 7.1 10*3/uL (ref 4.0–10.5)
nRBC: 0 % (ref 0.0–0.2)

## 2021-11-23 LAB — BASIC METABOLIC PANEL
Anion gap: 8 (ref 5–15)
BUN: 10 mg/dL (ref 6–20)
CO2: 21 mmol/L — ABNORMAL LOW (ref 22–32)
Calcium: 8.8 mg/dL — ABNORMAL LOW (ref 8.9–10.3)
Chloride: 104 mmol/L (ref 98–111)
Creatinine, Ser: 0.65 mg/dL (ref 0.44–1.00)
GFR, Estimated: >60 mL/min (ref >60)
Glucose, Bld: 102 mg/dL — ABNORMAL HIGH (ref 70–99)
Potassium: 3.9 mmol/L (ref 3.5–5.1)
Sodium: 133 mmol/L — ABNORMAL LOW (ref 135–145)

## 2021-11-23 MED ORDER — IOHEXOL 300 MG/ML  SOLN
75.0000 mL | Freq: Once | INTRAMUSCULAR | Status: AC | PRN
  Administered 2021-11-23: 75 mL via INTRAVENOUS

## 2021-11-23 NOTE — Discharge Instructions (Addendum)
Continue your antibiotic continue your follow-up with the oral surgeon and your dentist.  CT scan without evidence of any significant abscess or soft tissue swelling.  Labs without any significant abnormalities.

## 2021-11-23 NOTE — ED Triage Notes (Signed)
Pt arrives pov, steady gait, c/o dental problem, left lower dental pain, currently taking abx.. Also c/o neck stiffness, with HA and dizziness with n/v. Also endorses intermittent CP x 2 weeks.

## 2021-11-23 NOTE — ED Notes (Signed)
Patient states that she is having pain left lower mouth. Alert x4 Reports vomiting x4 in the last 24 hours . States she has a headache.

## 2021-11-23 NOTE — ED Provider Notes (Signed)
[facility] DEPARTMENT Provider Note   CSN: [id number] Arrival date & time: 11/23/21  1236     History  Chief Complaint  Patient presents with   Chest Pain   Dental Pain    Allison Cunningham is a 41 y.o. female who presents with several weeks of tooth pain, now associated with fevers and subjective difficulty swallowing.  Has been evaluated for tooth pain.  Is taking clindamycin at home.  Reports that it is her left lower wisdom tooth.  Reports swelling on that side that makes it difficult for her to swallow.  At home yesterday had fevers as high as 101 F.  Associated with headache.  Also has chest pain associated with cough for 1 week.  Cough is nonproductive.  Also complains of nausea and vomiting.  Reports that she has vomited upwards of 3 times in the last 24 hours.  Review of systems negative for abdominal pain, shortness of breath.   Chest Pain Dental Pain      Home Medications Prior to Admission medications   Medication Sig Start Date End Date Taking? Authorizing Provider  acetaminophen (TYLENOL) 325 MG tablet Take 650 mg by mouth every 6 (six) hours as needed. Patient used this medication for pain.    [provider]  albuterol (VENTOLIN HFA) 108 (90 Base) MCG/ACT inhaler INHALE 2 PUFFS BY MOUTH INTO THE LUNGS EVERY 4 TO 6 HOURS AS NEEDED FOR WHEEZING OR SHORTNESS OF BREATH. 01/30/20 01/29/21  Joneen Boers, MD  albuterol (VENTOLIN HFA) 108 (90 Base) MCG/ACT inhaler Inhale 2 puffs into the lungs every 4 (four) hours as needed. 12/18/20     apixaban (ELIQUIS) 5 MG TABS tablet TAKE 1 TABLET BY MOUTH TWICE DAILY 06/17/20 06/17/21  Margarito Courser, MD  APIXABAN Arne [place]) VTE STARTER PACK ('10MG'$  AND '5MG'$ ) TAKE TWO '5MG'$  TABLETS BY MOUTH 2 TIMES DAILY FOR 1 WEEK THEN ONE '5MG'$  TABLET 2 TIMES DAILY THEREAFTER 05/24/20 05/24/21  Raspet, Derry Skill, PA-C  Armodafinil 150 MG tablet Take one tablet (150 mg dose) by mouth daily. 07/15/21     Armodafinil 50 MG tablet Take two tablets  (100 mg dose) by mouth daily. 09/27/20     atorvastatin (LIPITOR) 10 MG tablet Take one tablet (10 mg dose) by mouth at bedtime. 10/07/21     baclofen (LIORESAL) 10 MG tablet Take one tablet (10 mg dose) by mouth 3 (three) times a day as needed (Muscle spasm, muscle pain). 05/20/21     Biotin 5000 MCG TABS Take 1 tablet by mouth daily.    [provider]  Brexpiprazole 0.25 MG TABS Take by mouth. 04/09/17   [provider]  busPIRone (BUSPAR) 10 MG tablet Take 10 mg by mouth 2 (two) times daily. 04/15/18   [provider]  busPIRone (BUSPAR) 10 MG tablet Take 1 tablet by mouth 3 times a day 11/08/20     busPIRone (BUSPAR) 10 MG tablet Take 1 tablet (10 mg total) by mouth 3 (three) times daily. 02/14/21     busPIRone (BUSPAR) 10 MG tablet Take 1 tablet by mouth 3 times a day 05/21/21     busPIRone (BUSPAR) 10 MG tablet Take 1 tablet by mouth 3 times a day 05/21/21     carvedilol (COREG) 3.125 MG tablet Take 3.125 mg by mouth 2 (two) times daily. 12/25/19   [provider]  carvedilol (COREG) 3.125 MG tablet Take one tablet (3.125 mg dose) by mouth 2 (two) times daily. 10/07/21  cefdinir (OMNICEF) 300 MG capsule Take 1 capsule (300 mg total) by mouth 2 (two) times daily. 08/20/21   Carlisle Cater, PA-C  cetirizine (ZYRTEC) 10 MG tablet Take 10 mg by mouth daily.    [provider]  chlorhexidine (PERIDEX) 0.12 % solution Swish and spit 15 mLs 2 (two) times daily for 7 days. 05/02/21     chlorzoxazone (PARAFON) 500 MG tablet One tablet as needed up to four times daily for headache 02/19/20   [provider]  chlorzoxazone (PARAFON) 500 MG tablet Take 1 tablet by mouth up to four times daily for headache 07/29/20     chlorzoxazone (PARAFON) 500 MG tablet Take 1 tablet by mouth as needed up to four times daily for headache 10/06/21     citalopram (CELEXA) 10 MG tablet Take 10 mg by mouth daily. 05/16/18   [provider]  citalopram (CELEXA) 20 MG tablet  TAKE 1 TABLET BY MOUTH AT BEDTIME 07/09/20 07/09/21  Elsie Stain, PA  citalopram (CELEXA) 20 MG tablet TAKE 1 TABLET BY MOUTH EVERY NIGHT AT BEDTIME 04/09/20 04/09/21  Elsie Stain, PA  citalopram (CELEXA) 20 MG tablet TAKE 1 TABLET BY MOUTH AT BEDTIME 12/20/19 12/19/20  Elsie Stain, PA  citalopram (CELEXA) 20 MG tablet Take 1 tablet by mouth at bedtime 01/14/21     citalopram (CELEXA) 20 MG tablet Take 1 tablet by mouth at bedtime 07/15/21     citalopram (CELEXA) 20 MG tablet Take 1 tablet (20 mg total) by mouth at bedtime. 07/15/21     clindamycin (CLEOCIN) 300 MG capsule Take 1 capsule by mouth every 8 hours until finished 11/20/21   Marta Lamas, DMD  clonazePAM (KLONOPIN) 0.5 MG tablet Take 0.5 mg by mouth 2 (two) times daily.    [provider]  clonazePAM (KLONOPIN) 0.5 MG tablet TAKE 1/2 TABLET BY MOUTH DAILY 04/09/20 10/06/20  Elsie Stain, PA  clonazePAM (KLONOPIN) 0.5 MG tablet Take 1/2 tablet (0.25 mg total) by mouth daily. 02/14/21     clonazePAM (KLONOPIN) 0.5 MG tablet Take 1 tablet by mouth daily 07/15/21     clonazePAM (KLONOPIN) 0.5 MG tablet Take 1 po daily 10/31/21     cyclobenzaprine (FLEXERIL) 10 MG tablet Take one tablet (10 mg dose) by mouth 2 (two) times a day as needed for Muscle spasms for up to 10 days. 05/14/21     diclofenac (VOLTAREN) 75 MG EC tablet Take 75 mg by mouth 2 (two) times daily. 10/09/19   [provider]  diclofenac (VOLTAREN) 75 MG EC tablet Take 1 tablet (75 mg total) by mouth 2 (two) times daily. 04/18/20   Wallene Huh, DPM  dicyclomine (BENTYL) 20 MG tablet Take one tablet (20 mg dose) by mouth 2 (two) times daily. 07/31/20     Erenumab-aooe (AIMOVIG) 140 MG/ML SOAJ Inject 140 mg into the skin every 30 (thirty) days. 05/21/21     Erenumab-aooe (AIMOVIG) 140 MG/ML SOAJ Inject 140 mg into the skin every 30 (thirty) days. 06/16/21     Erenumab-aooe (AIMOVIG) 140 MG/ML SOAJ Inject 140 mg into the skin every 30 (thirty) days. 10/17/21     fenofibrate 160  MG tablet TAKE ONE TABLET (160 MG DOSE) BY MOUTH DAILY. 05/20/20 05/20/21  Margarito Courser, MD  fenofibrate 160 MG tablet Take one tablet (160 mg dose) by mouth daily. 08/01/21     fluticasone (FLONASE) 50 MCG/ACT nasal spray Place 2 sprays into both nostrils daily. 09/05/20   Brunetta Jeans, PA-C  fluticasone (  FLONASE) 50 MCG/ACT nasal spray Place 1 spray into the nose daily 10/08/20     fluticasone (FLONASE) 50 MCG/ACT nasal spray Place 1 spray into both nostrils daily. 12/18/20     gabapentin (NEURONTIN) 100 MG capsule Take one capsule (100 mg dose) by mouth at bedtime for 1 week and then increase to 2 capsules at bedtime for 1 week and then increase to 3 capsules at bedtime to continue 05/20/21     gabapentin (NEURONTIN) 300 MG capsule Take one capsule (300 mg dose) by mouth at bedtime. 09/30/21     HYDROcodone-acetaminophen (NORCO/VICODIN) 5-325 MG tablet Take one tablet by mouth every 4 (four) hours as needed for Pain for up to 3 days. 07/17/20     hydrOXYzine (ATARAX) 50 MG tablet Take 1 by mouth at bedtime 05/21/21     hydrOXYzine (ATARAX/VISTARIL) 25 MG tablet Take by mouth. 09/08/19   [provider]  hydrOXYzine (ATARAX/VISTARIL) 50 MG tablet Take 1 tablet by mouth at bedtime 11/08/20     hydrOXYzine (ATARAX/VISTARIL) 50 MG tablet Take 1 tablet (50 mg total) by mouth at bedtime. 02/14/21     ibuprofen (ADVIL) 800 MG tablet Take 1 tablet (800 mg total) by mouth every 8 (eight) hours with food as needed for pain 11/21/21   Mar Daring, PA-C  ketorolac (TORADOL) 10 MG tablet Take one tablet (10 mg dose) by mouth every 6 (six) hours as needed. 07/17/20     meloxicam (MOBIC) 15 MG tablet Take one tablet (15 mg dose) by mouth daily. Take with meals 05/20/21     metFORMIN (GLUCOPHAGE-XR) 500 MG 24 hr tablet TAKE 1 TABLET BY MOUTH DAILY WITH FOOD 04/09/20 04/09/21  Elsie Stain, PA  metFORMIN (GLUCOPHAGE-XR) 500 MG 24 hr tablet TAKE 1 TABLET BY MOUTH ONCE DAILY WITH FOOD 03/18/20 03/18/21  Elsie Stain,  PA  metFORMIN (GLUCOPHAGE-XR) 500 MG 24 hr tablet TAKE 1 TABLET BY MOUTH ONCE DAILY WITH FOOD 01/19/20 01/18/21  Elsie Stain, PA  metFORMIN (GLUCOPHAGE-XR) 750 MG 24 hr tablet TAKE 1 TABLET BY MOUTH ONCE DAILY 06/10/20 06/10/21  Elsie Stain, PA  metFORMIN (GLUCOPHAGE-XR) 750 MG 24 hr tablet Take 1 tablet by mouth daily 05/21/21     modafinil (PROVIGIL) 200 MG tablet Take 300 mg by mouth every morning. 09/17/19   [provider]  modafinil (PROVIGIL) 200 MG tablet TAKE 1 & 1/2 TABLETS BY MOUTH EVERY MORNING 05/20/20 11/16/20  Rose, Crystal, PA-C  modafinil (PROVIGIL) 200 MG tablet TAKE 1 & 1/2 TABLETS BY MOUTH EVERY MORNING **NEEDS OFFICE VISIT FOR FURTHER REFILLS** 03/12/20 09/08/20  Rose, Crystal, PA-C  modafinil (PROVIGIL) 200 MG tablet TAKE 1 & 1/2 TABLETS BY MOUTH EVERY MORNING **NEEDS OFFICE VISIT FOR FURTHER REFILLS** 07/18/20     nitrofurantoin, macrocrystal-monohydrate, (MACROBID) 100 MG capsule Take 1 capsule (100 mg total) by mouth 2 (two) times daily. 09/16/21   Brunetta Jeans, PA-C  Norgestimate-Ethinyl Estradiol Triphasic 0.18/0.215/0.25 MG-25 MCG tab TAKE 1 TABLET BY MOUTH ONCE DAILY 01/16/20 01/15/21  Kang-Oh, Denny Peon E, DO  omeprazole (PRILOSEC) 40 MG capsule TAKE 1 CAPSULE (40 MG TOTAL) BY MOUTH DAILY. 01/15/20 01/14/21  Evelina Dun A, FNP  omeprazole (PRILOSEC) 40 MG capsule Take 1 capsule by mouth daily. 08/19/21     ondansetron (ZOFRAN) 4 MG tablet Take 1 tablet (4 mg total) by mouth every 8 (eight) hours as needed for nausea or vomiting. 03/13/20   Wallene Huh, DPM  ondansetron (ZOFRAN-ODT) 4 MG disintegrating tablet Take 1 tablet (4 mg  total) by mouth every 8 (eight) hours as needed for nausea or vomiting. 11/07/21   Fransico Meadow, PA-C  Oxcarbazepine (TRILEPTAL) 300 MG tablet TAKE 1 TABLET BY MOUTH AT BEDTIME 06/10/20 06/10/21  Elsie Stain, PA  oxcarbazepine (TRILEPTAL) 600 MG tablet Take 1 tablet by mouth daily every night at bedtime 08/05/20     oxcarbazepine (TRILEPTAL) 600 MG  tablet Take 1 tablet by mouth every night at bedtime 08/05/20     oxcarbazepine (TRILEPTAL) 600 MG tablet Take 1 tablet by mouth once every night at bedtime 02/11/21     oxcarbazepine (TRILEPTAL) 600 MG tablet Take 1 tablet (600 mg total) by mouth at bedtime. 05/21/21     paliperidone (INVEGA SUSTENNA) 156 MG/ML SUSY injection USE ONCE A MONTH INTRAMUSCULAR INJECTION 06/10/20 06/10/21  Elsie Stain, PA  paliperidone (INVEGA SUSTENNA) 156 MG/ML SUSY injection INJECT INTO THE MUSCLE ONCE MONTHLY 05/06/20 05/06/21  Elsie Stain, PA  paliperidone (INVEGA SUSTENNA) 156 MG/ML SUSY injection INJECT 1 INJECTION ONCE A MONTH INTRAMUSCULARLY 04/02/20 04/02/21  Elsie Stain, PA  paliperidone (INVEGA SUSTENNA) 156 MG/ML SUSY injection INJECT ONCE A MONTH 02/29/20 02/28/21  Elsie Stain, PA  paliperidone (INVEGA SUSTENNA) 156 MG/ML SUSY injection USE AS DIRECTED ONCE A MONTH INJECTION INTO THE MUSCLE 01/02/20 01/01/21  Elsie Stain, PA  paliperidone (INVEGA SUSTENNA) 156 MG/ML SUSY injection Inject 16m once monthly as directed 08/05/20     paliperidone (INVEGA SUSTENNA) 156 MG/ML SUSY injection Inject into the muscle once monthly 08/05/20     paliperidone (INVEGA SUSTENNA) 156 MG/ML SUSY injection Use as directed once a month into the muscle injection 07/15/21     paliperidone (INVEGA) 3 MG 24 hr tablet Take by mouth. 03/29/17   [provider]  paliperidone (INVEGA) 3 MG 24 hr tablet Take 1 tablet (3 mg total) by mouth at bedtime. 11/11/21     Paliperidone Palmitate ER (INVEGA TRINZA) 546 MG/1.75ML SUSY Inject once into the muscle every 3 months 02/10/21     Paliperidone Palmitate ER (INVEGA TRINZA) 546 MG/1.75ML SUSY Take 1 injection Intramuscularly every 3 months 05/12/21     prazosin (MINIPRESS) 2 MG capsule Take 1 tablet by mouth at bedtime 05/21/21     prazosin (MINIPRESS) 5 MG capsule Take 1 tablet by mouth at bedtime 06/19/21     predniSONE (DELTASONE) 20 MG tablet Take two tablets (40 mg dose) by mouth daily  for 5 days. 10/18/20     prochlorperazine (COMPAZINE) 10 MG tablet Take one tablet (10 mg dose) by mouth every 8 (eight) hours as needed. 09/30/21     prochlorperazine (COMPAZINE) 10 MG tablet Take one tablet (10 mg dose) by mouth every 8 (eight) hours as needed. 10/06/21     promethazine-dextromethorphan (PROMETHAZINE-DM) 6.25-15 MG/5ML syrup Take 5 mLs by mouth 4 (four) times a day as needed for up to 7 days. 03/05/21     SUMAtriptan (IMITREX) 100 MG tablet Take 1 tablet by mouth at onset of headache. May repeat in 2 hours if headache pain persists. **Max of 2 tablets in 24 hours** 07/29/20     SUMAtriptan (IMITREX) 100 MG tablet One tablet at the onset of headache.  May repeat in two hours if the headache pain persists.  No more than 2 tablets in a 24 hour period. 05/21/21     tiZANidine (ZANAFLEX) 2 MG tablet Take one tablet (2 mg dose) by mouth at bedtime. 10/07/21     topiramate (TOPAMAX) 100 MG tablet TAKE 1 TABLET BY MOUTH EVERY NIGHT AT  BEDTIME 11/02/19 11/01/20  Preston Fleeting, FNP  topiramate (TOPAMAX) 100 MG tablet TAKE 1 TABLET BY MOUTH EVERY NIGHT AT BEDTIME 10/30/20     topiramate (TOPAMAX) 100 MG tablet Take 1 tablet (100 mg total) by mouth at bedtime. 10/30/20     topiramate (TOPAMAX) 25 MG tablet Take one tablet at bedtime for one week, then two tablets at bedtime for one week, then three tablets at bedtime 07/07/19   [provider]  topiramate (TOPAMAX) 50 MG tablet Take three tablets (150 mg dose) by mouth at bedtime. 09/30/21     TRI-LO-MARZIA 0.18/0.215/0.25 MG-25 MCG tab Take 1 tablet by mouth daily. 01/17/18   [provider]  triamcinolone cream (KENALOG) 0.1 % Apply on to the skin 2 times daily as needed 09/16/20     valbenazine (INGREZZA) 80 MG capsule Take one capsule by mouth daily 04/25/21     Valbenazine Tosylate 80 MG CAPS Take by mouth. 01/07/17   [provider]  viloxazine ER (QELBREE) 200 MG 24 hr capsule Take 2 capsules (400 mg total) by mouth daily.  04/25/21     viloxazine ER (QELBREE) 200 MG 24 hr capsule Take 2 capsules (400 mg total) by mouth daily. 10/31/21     viloxazine ER (QELBREE) 200 MG 24 hr capsule Take 2 capsules (400 mg total) by mouth daily. 10/31/21         Allergies    Lamotrigine, Cephalexin, Clarithromycin, Amoxicillin-pot clavulanate, and Penicillins    Review of Systems   Review of Systems  Cardiovascular:  Positive for chest pain.    Physical Exam Updated Vital Signs BP 106/65   Pulse 80   Temp 98.5 F (36.9 C) (Oral)   Resp 19   Ht '5\' 4"'$  (1.626 m)   Wt 94.3 kg   LMP  (LMP Unknown)   SpO2 97%   BMI 35.70 kg/m  Physical Exam Vitals and nursing note reviewed.  Constitutional:      General: She is not in acute distress.    Appearance: She is well-developed.  HENT:     Head: Normocephalic and atraumatic.  Eyes:     Conjunctiva/sclera: Conjunctivae normal.  Cardiovascular:     Rate and Rhythm: Normal rate and regular rhythm.     Heart sounds: No murmur heard. Pulmonary:     Effort: Pulmonary effort is normal. No respiratory distress.     Breath sounds: Normal breath sounds.  Abdominal:     Palpations: Abdomen is soft.     Tenderness: There is no abdominal tenderness.  Musculoskeletal:        General: No swelling.     Cervical back: Neck supple.  Lymphadenopathy:     Cervical: Cervical adenopathy present.  Skin:    General: Skin is warm and dry.     Capillary Refill: Capillary refill takes less than 2 seconds.  Neurological:     Mental Status: She is alert.  Psychiatric:        Mood and Affect: Mood normal.     ED Results / Procedures / Treatments   Labs (all labs ordered are listed, but only abnormal results are displayed) Labs Reviewed  CBC WITH DIFFERENTIAL/PLATELET - Abnormal; Notable for the following components:      Result Value   HCT 35.1 (*)    All other components within normal limits  BASIC METABOLIC PANEL - Abnormal; Notable for the following components:   Sodium 133 (*)     CO2 21 (*)    Glucose,  Bld 102 (*)    Calcium 8.8 (*)    All other components within normal limits    EKG EKG Interpretation  Date/Time:  Sunday November 23 2021 12:50:47 EDT Ventricular Rate:  90 PR Interval:  180 QRS Duration: 98 QT Interval:  362 QTC Calculation: 441 R Axis:   36 Text Interpretation: Sinus rhythm Low voltage, extremity leads No previous tracing Confirmed by Blanchie Dessert [id number]) on 11/23/2021 1:02:48 PM  Radiology DG Chest 2 View  Result Date: 11/23/2021 CLINICAL DATA:  Chest pain and cough for 2 weeks. Nausea and vomiting. Headache and dizziness. EXAM: CHEST - 2 VIEW COMPARISON:  None Available. FINDINGS: The heart size and mediastinal contours are within normal limits. Both lungs are clear. The visualized skeletal structures are unremarkable. IMPRESSION: No active cardiopulmonary disease. Electronically Signed   By: Marlaine Hind M.D.   On: 11/23/2021 13:52    Procedures Procedures    Medications Ordered in ED Medications  iohexol (OMNIPAQUE) 300 MG/ML solution 75 mL (75 mLs Intravenous Contrast Given 11/23/21 1520)    ED Course/ Medical Decision Making/ A&P                           Medical Decision Making Amount and/or Complexity of Data Reviewed Labs: ordered. Radiology: ordered.  Risk Prescription drug management.   Allison Cunningham is a 41 year old female who presents with high fevers, neck pain, difficulty swallowing in the setting of dental infection of left mandibular molar.  Also with chest pain and cough.  Afebrile and hemodynamically stable on presentation today.  Patient is protecting her airway just fine.  No trismus.  Some unilateral swelling on left side of neck.  No obvious target for drainage.  Lungs clear.  Laboratory work-up unremarkable.  Will order CT soft tissue neck with contrast to look for infection.   Co morbidities that complicate the patient evaluation  Anxiety  Additional history obtained:  Additional history and/or  information obtained from chart review External records from outside source obtained and reviewed including charts from prior healthcare encounters   Lab Tests:  I Ordered (or co-signed), and personally interpreted labs.  The pertinent results include: CBC and BMP within normal limits   Imaging Studies ordered:  I ordered (or co-signed) imaging studies including CT soft tissue neck with contrast Pending at handoff  Reevaluation:  After the interventions noted above, I reevaluated the patient and found that they have :stayed the same.    Handoff completed with CT soft tissue neck pending.         Final Clinical Impression(s) / ED Diagnoses Final diagnoses:  None    Rx / DC Orders ED Discharge Orders     None         Nani Gasser, MD 11/23/21 [place], MD 11/26/21 1538

## 2021-11-23 NOTE — ED Notes (Signed)
Patient transported to CT 

## 2021-11-26 ENCOUNTER — Other Ambulatory Visit (HOSPITAL_BASED_OUTPATIENT_CLINIC_OR_DEPARTMENT_OTHER): Payer: Self-pay

## 2021-11-26 DIAGNOSIS — U071 COVID-19: Secondary | ICD-10-CM | POA: Diagnosis not present

## 2021-11-26 MED ORDER — OMEPRAZOLE 40 MG PO CPDR
40.0000 mg | DELAYED_RELEASE_CAPSULE | Freq: Every day | ORAL | 0 refills | Status: DC
Start: 2021-11-26 — End: 2022-02-18
  Filled 2021-11-26: qty 90, 90d supply, fill #0

## 2021-11-27 ENCOUNTER — Other Ambulatory Visit (HOSPITAL_BASED_OUTPATIENT_CLINIC_OR_DEPARTMENT_OTHER): Payer: Self-pay

## 2021-11-27 MED ORDER — FLUTICASONE PROPIONATE 50 MCG/ACT NA SUSP
2.0000 | Freq: Every day | NASAL | 0 refills | Status: DC
Start: 2021-11-26 — End: 2022-10-05
  Filled 2021-11-27: qty 16, 30d supply, fill #0

## 2021-11-27 MED ORDER — IPRATROPIUM BROMIDE 0.03 % NA SOLN
2.0000 | Freq: Three times a day (TID) | NASAL | 0 refills | Status: DC | PRN
  Filled 2021-11-27: qty 30, 50d supply, fill #0

## 2021-11-27 MED ORDER — BENZONATATE 100 MG PO CAPS
100.0000 mg | ORAL_CAPSULE | Freq: Three times a day (TID) | ORAL | 0 refills | Status: DC | PRN
  Filled 2021-11-27: qty 21, 7d supply, fill #0

## 2021-11-27 MED ORDER — LAGEVRIO 200 MG PO CAPS
4.0000 | ORAL_CAPSULE | Freq: Two times a day (BID) | ORAL | 0 refills | Status: DC
  Filled 2021-11-27: qty 40, 5d supply, fill #0

## 2021-12-08 DIAGNOSIS — F250 Schizoaffective disorder, bipolar type: Secondary | ICD-10-CM | POA: Diagnosis not present

## 2021-12-08 DIAGNOSIS — F902 Attention-deficit hyperactivity disorder, combined type: Secondary | ICD-10-CM | POA: Diagnosis not present

## 2021-12-08 DIAGNOSIS — F410 Panic disorder [episodic paroxysmal anxiety] without agoraphobia: Secondary | ICD-10-CM | POA: Diagnosis not present

## 2021-12-08 DIAGNOSIS — F4310 Post-traumatic stress disorder, unspecified: Secondary | ICD-10-CM | POA: Diagnosis not present

## 2021-12-09 ENCOUNTER — Other Ambulatory Visit (HOSPITAL_BASED_OUTPATIENT_CLINIC_OR_DEPARTMENT_OTHER): Payer: Self-pay

## 2021-12-12 ENCOUNTER — Other Ambulatory Visit (HOSPITAL_BASED_OUTPATIENT_CLINIC_OR_DEPARTMENT_OTHER): Payer: Self-pay

## 2021-12-12 MED ORDER — OXCARBAZEPINE 600 MG PO TABS
ORAL_TABLET | ORAL | 0 refills | Status: DC
  Filled 2021-12-16: qty 90, 90d supply, fill #0

## 2021-12-12 MED ORDER — PRAZOSIN HCL 5 MG PO CAPS
ORAL_CAPSULE | ORAL | 0 refills | Status: DC
  Filled 2021-12-12: qty 90, 90d supply, fill #0

## 2021-12-16 ENCOUNTER — Other Ambulatory Visit (HOSPITAL_BASED_OUTPATIENT_CLINIC_OR_DEPARTMENT_OTHER): Payer: Self-pay

## 2021-12-17 ENCOUNTER — Other Ambulatory Visit (HOSPITAL_BASED_OUTPATIENT_CLINIC_OR_DEPARTMENT_OTHER): Payer: Self-pay

## 2021-12-19 ENCOUNTER — Other Ambulatory Visit (HOSPITAL_BASED_OUTPATIENT_CLINIC_OR_DEPARTMENT_OTHER): Payer: Self-pay

## 2021-12-22 ENCOUNTER — Other Ambulatory Visit (HOSPITAL_BASED_OUTPATIENT_CLINIC_OR_DEPARTMENT_OTHER): Payer: Self-pay

## 2021-12-23 ENCOUNTER — Other Ambulatory Visit (HOSPITAL_BASED_OUTPATIENT_CLINIC_OR_DEPARTMENT_OTHER): Payer: Self-pay

## 2021-12-24 ENCOUNTER — Other Ambulatory Visit (HOSPITAL_BASED_OUTPATIENT_CLINIC_OR_DEPARTMENT_OTHER): Payer: Self-pay

## 2021-12-24 MED ORDER — METFORMIN HCL ER 750 MG PO TB24
750.0000 mg | ORAL_TABLET | Freq: Every day | ORAL | 2 refills | Status: DC
  Filled 2021-12-24: qty 30, 30d supply, fill #0
  Filled 2022-01-14 – 2022-01-19 (×2): qty 30, 30d supply, fill #1
  Filled 2022-02-18: qty 30, 30d supply, fill #2

## 2021-12-30 ENCOUNTER — Telehealth: Payer: 59 | Admitting: Physician Assistant

## 2021-12-30 ENCOUNTER — Other Ambulatory Visit (HOSPITAL_BASED_OUTPATIENT_CLINIC_OR_DEPARTMENT_OTHER): Payer: Self-pay

## 2021-12-30 DIAGNOSIS — J019 Acute sinusitis, unspecified: Secondary | ICD-10-CM

## 2021-12-30 DIAGNOSIS — B9689 Other specified bacterial agents as the cause of diseases classified elsewhere: Secondary | ICD-10-CM | POA: Diagnosis not present

## 2021-12-30 MED ORDER — TOPIRAMATE 50 MG PO TABS
150.0000 mg | ORAL_TABLET | Freq: Every evening | ORAL | 3 refills | Status: AC
  Filled 2021-12-30: qty 270, 90d supply, fill #0

## 2021-12-30 MED ORDER — DOXYCYCLINE HYCLATE 100 MG PO TABS
100.0000 mg | ORAL_TABLET | Freq: Two times a day (BID) | ORAL | 0 refills | Status: DC
  Filled 2021-12-30: qty 20, 10d supply, fill #0

## 2021-12-30 NOTE — Progress Notes (Signed)

## 2021-12-30 NOTE — Progress Notes (Signed)
I have spent 5 minutes in review of e-visit questionnaire, review and updating patient chart, medical decision making and response to patient.   Royalti Schauf Cody Nyomie Ehrlich, PA-C    

## 2021-12-31 ENCOUNTER — Other Ambulatory Visit (HOSPITAL_BASED_OUTPATIENT_CLINIC_OR_DEPARTMENT_OTHER): Payer: Self-pay

## 2022-01-02 DIAGNOSIS — E7849 Other hyperlipidemia: Secondary | ICD-10-CM | POA: Diagnosis not present

## 2022-01-02 DIAGNOSIS — Z23 Encounter for immunization: Secondary | ICD-10-CM | POA: Diagnosis not present

## 2022-01-12 ENCOUNTER — Other Ambulatory Visit (HOSPITAL_BASED_OUTPATIENT_CLINIC_OR_DEPARTMENT_OTHER): Payer: Self-pay

## 2022-01-12 MED ORDER — CARVEDILOL 3.125 MG PO TABS
3.1250 mg | ORAL_TABLET | Freq: Two times a day (BID) | ORAL | 1 refills | Status: DC
  Filled 2022-01-12: qty 180, 90d supply, fill #0

## 2022-01-12 MED ORDER — ATORVASTATIN CALCIUM 10 MG PO TABS
10.0000 mg | ORAL_TABLET | Freq: Every day | ORAL | 1 refills | Status: AC
  Filled 2022-01-14: qty 90, 90d supply, fill #0

## 2022-01-12 MED ORDER — GABAPENTIN 300 MG PO CAPS
300.0000 mg | ORAL_CAPSULE | Freq: Every day | ORAL | 0 refills | Status: AC
Start: 2022-01-12 — End: ?
  Filled 2022-01-12: qty 90, 90d supply, fill #0

## 2022-01-14 ENCOUNTER — Other Ambulatory Visit (HOSPITAL_BASED_OUTPATIENT_CLINIC_OR_DEPARTMENT_OTHER): Payer: Self-pay

## 2022-01-15 DIAGNOSIS — R002 Palpitations: Secondary | ICD-10-CM | POA: Diagnosis not present

## 2022-01-15 DIAGNOSIS — R079 Chest pain, unspecified: Secondary | ICD-10-CM | POA: Diagnosis not present

## 2022-01-16 ENCOUNTER — Other Ambulatory Visit (HOSPITAL_BASED_OUTPATIENT_CLINIC_OR_DEPARTMENT_OTHER): Payer: Self-pay

## 2022-01-16 MED ORDER — CLONAZEPAM 0.5 MG PO TABS
0.5000 mg | ORAL_TABLET | Freq: Every day | ORAL | 0 refills | Status: DC
  Filled 2022-01-29: qty 30, 30d supply, fill #0

## 2022-01-19 ENCOUNTER — Other Ambulatory Visit (HOSPITAL_BASED_OUTPATIENT_CLINIC_OR_DEPARTMENT_OTHER): Payer: Self-pay

## 2022-01-21 ENCOUNTER — Telehealth: Payer: 59 | Admitting: Physician Assistant

## 2022-01-21 DIAGNOSIS — R300 Dysuria: Secondary | ICD-10-CM

## 2022-01-21 NOTE — Progress Notes (Signed)

## 2022-01-22 ENCOUNTER — Other Ambulatory Visit (HOSPITAL_BASED_OUTPATIENT_CLINIC_OR_DEPARTMENT_OTHER): Payer: Self-pay

## 2022-01-23 MED ORDER — CEPHALEXIN 500 MG PO CAPS: 500.0000 mg | ORAL_CAPSULE | Freq: Two times a day (BID) | ORAL | 0 refills | Status: DC

## 2022-01-23 NOTE — Addendum Note (Signed)
Addended by: Mar Daring on: 01/23/2022 07:09 PM   Modules accepted: Orders

## 2022-01-26 ENCOUNTER — Other Ambulatory Visit (HOSPITAL_COMMUNITY): Payer: Self-pay

## 2022-01-26 ENCOUNTER — Other Ambulatory Visit (HOSPITAL_BASED_OUTPATIENT_CLINIC_OR_DEPARTMENT_OTHER): Payer: Self-pay

## 2022-01-28 ENCOUNTER — Other Ambulatory Visit (HOSPITAL_BASED_OUTPATIENT_CLINIC_OR_DEPARTMENT_OTHER): Payer: Self-pay

## 2022-01-28 MED ORDER — ARMODAFINIL 150 MG PO TABS
ORAL_TABLET | ORAL | 0 refills | Status: DC
Start: 2022-01-28 — End: 2022-02-24
  Filled 2022-01-28: qty 30, 30d supply, fill #0

## 2022-01-29 ENCOUNTER — Other Ambulatory Visit (HOSPITAL_BASED_OUTPATIENT_CLINIC_OR_DEPARTMENT_OTHER): Payer: Self-pay

## 2022-02-09 ENCOUNTER — Other Ambulatory Visit (HOSPITAL_BASED_OUTPATIENT_CLINIC_OR_DEPARTMENT_OTHER): Payer: Self-pay

## 2022-02-09 ENCOUNTER — Telehealth: Payer: 59 | Admitting: Physician Assistant

## 2022-02-09 DIAGNOSIS — L2084 Intrinsic (allergic) eczema: Secondary | ICD-10-CM | POA: Diagnosis not present

## 2022-02-09 MED ORDER — TRIAMCINOLONE ACETONIDE 0.1 % EX CREA
TOPICAL_CREAM | CUTANEOUS | 0 refills | Status: AC
  Filled 2022-02-09: qty 90, 30d supply, fill #0

## 2022-02-09 NOTE — Progress Notes (Signed)

## 2022-02-18 ENCOUNTER — Other Ambulatory Visit (HOSPITAL_BASED_OUTPATIENT_CLINIC_OR_DEPARTMENT_OTHER): Payer: Self-pay

## 2022-02-18 MED ORDER — FENOFIBRATE 160 MG PO TABS
160.0000 mg | ORAL_TABLET | Freq: Every day | ORAL | 1 refills | Status: DC
  Filled 2022-02-18: qty 90, 90d supply, fill #0

## 2022-02-18 MED ORDER — OMEPRAZOLE 40 MG PO CPDR
40.0000 mg | DELAYED_RELEASE_CAPSULE | Freq: Every day | ORAL | 1 refills | Status: AC
Start: 2022-02-18 — End: ?
  Filled 2022-03-18: qty 90, 90d supply, fill #0

## 2022-02-19 ENCOUNTER — Other Ambulatory Visit (HOSPITAL_BASED_OUTPATIENT_CLINIC_OR_DEPARTMENT_OTHER): Payer: Self-pay

## 2022-02-19 DIAGNOSIS — R079 Chest pain, unspecified: Secondary | ICD-10-CM | POA: Diagnosis not present

## 2022-02-19 DIAGNOSIS — R002 Palpitations: Secondary | ICD-10-CM | POA: Diagnosis not present

## 2022-02-23 ENCOUNTER — Other Ambulatory Visit (HOSPITAL_BASED_OUTPATIENT_CLINIC_OR_DEPARTMENT_OTHER): Payer: Self-pay

## 2022-02-24 ENCOUNTER — Other Ambulatory Visit (HOSPITAL_BASED_OUTPATIENT_CLINIC_OR_DEPARTMENT_OTHER): Payer: Self-pay

## 2022-02-24 MED ORDER — ARMODAFINIL 150 MG PO TABS
150.0000 mg | ORAL_TABLET | Freq: Every day | ORAL | 0 refills | Status: DC
  Filled 2022-02-24 – 2022-02-25 (×2): qty 30, 30d supply, fill #0

## 2022-02-25 ENCOUNTER — Other Ambulatory Visit (HOSPITAL_BASED_OUTPATIENT_CLINIC_OR_DEPARTMENT_OTHER): Payer: Self-pay

## 2022-02-27 DIAGNOSIS — R55 Syncope and collapse: Secondary | ICD-10-CM | POA: Diagnosis not present

## 2022-02-27 DIAGNOSIS — R079 Chest pain, unspecified: Secondary | ICD-10-CM | POA: Diagnosis not present

## 2022-02-27 DIAGNOSIS — R002 Palpitations: Secondary | ICD-10-CM | POA: Diagnosis not present

## 2022-02-28 ENCOUNTER — Other Ambulatory Visit: Payer: Self-pay

## 2022-02-28 ENCOUNTER — Emergency Department (HOSPITAL_BASED_OUTPATIENT_CLINIC_OR_DEPARTMENT_OTHER)
Admission: EM | Admit: 2022-02-28 | Discharge: 2022-02-28 | Disposition: A | Discharge status: Home / Self Care | Payer: 59 | Attending: Emergency Medicine | Admitting: Emergency Medicine

## 2022-02-28 ENCOUNTER — Encounter (HOSPITAL_BASED_OUTPATIENT_CLINIC_OR_DEPARTMENT_OTHER): Payer: Self-pay | Admitting: Emergency Medicine

## 2022-02-28 DIAGNOSIS — E876 Hypokalemia: Secondary | ICD-10-CM | POA: Diagnosis not present

## 2022-02-28 DIAGNOSIS — R1013 Epigastric pain: Secondary | ICD-10-CM | POA: Diagnosis not present

## 2022-02-28 DIAGNOSIS — I959 Hypotension, unspecified: Secondary | ICD-10-CM | POA: Diagnosis not present

## 2022-02-28 DIAGNOSIS — R42 Dizziness and giddiness: Secondary | ICD-10-CM | POA: Diagnosis not present

## 2022-02-28 DIAGNOSIS — I10 Essential (primary) hypertension: Secondary | ICD-10-CM | POA: Insufficient documentation

## 2022-02-28 DIAGNOSIS — Z7901 Long term (current) use of anticoagulants: Secondary | ICD-10-CM | POA: Diagnosis not present

## 2022-02-28 LAB — CBC WITH DIFFERENTIAL/PLATELET
Abs Immature Granulocytes: 0.02 10*3/uL (ref 0.00–0.07)
Basophils Absolute: 0.1 10*3/uL (ref 0.0–0.1)
Basophils Relative: 1 %
Eosinophils Absolute: 1 10*3/uL — ABNORMAL HIGH (ref 0.0–0.5)
Eosinophils Relative: 9 %
HCT: 35.7 % — ABNORMAL LOW (ref 36.0–46.0)
Hemoglobin: 12 g/dL (ref 12.0–15.0)
Immature Granulocytes: 0 %
Lymphocytes Relative: 29 %
Lymphs Abs: 3.3 10*3/uL (ref 0.7–4.0)
MCH: 29 pg (ref 26.0–34.0)
MCHC: 33.6 g/dL (ref 30.0–36.0)
MCV: 86.2 fL (ref 80.0–100.0)
Monocytes Absolute: 0.7 10*3/uL (ref 0.1–1.0)
Monocytes Relative: 6 %
Neutro Abs: 6.1 10*3/uL (ref 1.7–7.7)
Neutrophils Relative %: 55 %
Platelets: 399 10*3/uL (ref 150–400)
RBC: 4.14 MIL/uL (ref 3.87–5.11)
RDW: 13.2 % (ref 11.5–15.5)
WBC: 11.2 10*3/uL — ABNORMAL HIGH (ref 4.0–10.5)
nRBC: 0 % (ref 0.0–0.2)

## 2022-02-28 LAB — URINALYSIS, ROUTINE W REFLEX MICROSCOPIC
Glucose, UA: NEGATIVE mg/dL
Ketones, ur: NEGATIVE mg/dL
Leukocytes,Ua: NEGATIVE
Nitrite: NEGATIVE
Protein, ur: 30 mg/dL — AB
Specific Gravity, Urine: >=1.03 (ref 1.005–1.030)
pH: 5.5 (ref 5.0–8.0)

## 2022-02-28 LAB — COMPREHENSIVE METABOLIC PANEL
ALT: 13 U/L (ref 0–44)
AST: 18 U/L (ref 15–41)
Albumin: 3.4 g/dL — ABNORMAL LOW (ref 3.5–5.0)
Alkaline Phosphatase: 29 U/L — ABNORMAL LOW (ref 38–126)
Anion gap: 8 (ref 5–15)
BUN: 13 mg/dL (ref 6–20)
CO2: 21 mmol/L — ABNORMAL LOW (ref 22–32)
Calcium: 8.6 mg/dL — ABNORMAL LOW (ref 8.9–10.3)
Chloride: 105 mmol/L (ref 98–111)
Creatinine, Ser: 0.75 mg/dL (ref 0.44–1.00)
GFR, Estimated: >60 mL/min (ref >60)
Glucose, Bld: 113 mg/dL — ABNORMAL HIGH (ref 70–99)
Potassium: 3.2 mmol/L — ABNORMAL LOW (ref 3.5–5.1)
Sodium: 134 mmol/L — ABNORMAL LOW (ref 135–145)
Total Bilirubin: 0.3 mg/dL (ref 0.3–1.2)
Total Protein: 6.7 g/dL (ref 6.5–8.1)

## 2022-02-28 LAB — URINALYSIS, MICROSCOPIC (REFLEX)

## 2022-02-28 LAB — PREGNANCY, URINE: Preg Test, Ur: NEGATIVE

## 2022-02-28 MED ORDER — SODIUM CHLORIDE 0.9 % IV BOLUS
1000.0000 mL | Freq: Once | INTRAVENOUS | Status: AC
  Administered 2022-02-28: 1000 mL via INTRAVENOUS

## 2022-02-28 MED ORDER — POTASSIUM CHLORIDE CRYS ER 20 MEQ PO TBCR
40.0000 meq | EXTENDED_RELEASE_TABLET | Freq: Once | ORAL | Status: AC
  Administered 2022-02-28: 40 meq via ORAL
  Filled 2022-02-28: qty 2

## 2022-02-28 NOTE — ED Triage Notes (Signed)
Patient arrived via POV c/o dizziness x months. Patient states feeling "like she's going to pass out". Patient recently seen for palpitations and dizziness by cardiology. Patient is AO x 4, VS WDL, normal gait.

## 2022-02-28 NOTE — ED Provider Notes (Signed)
[facility]PART[facility] Provider Note   CSN: [id number] Arrival date & time: 02/28/22  1955     History  Chief Complaint  Patient presents with   Dizziness    Allison Cunningham is a 41 y.o. female with a past medical history of migraines, palpitations on carvedilol, schizoaffective disorder and recurrent dizziness/lightheadedness since being diagnosed with COVID in 2021 presenting today due to hypertension.  She reports that she took her blood pressure multiple times today and got all hypotensive readings.  Readings were: 76/42, 89/49 and 89/57.  She takes carvedilol for tachycardia but did not take it yesterday or today due to the hypotension.  She says that during these times she felt lightheaded and presyncopal.  No shortness of breath, chest pain, leg swelling, abdominal pain, nausea or vomiting.  Yesterday she saw [facility] cardiology who cleared her from a cardiac standpoint.  She does say that she has been having these type of hypotensive/lightheaded episodes for many months to a year intermittently.  Dizziness Associated symptoms: no chest pain, no diarrhea, no nausea, no palpitations, no shortness of breath and no vomiting        Home Medications Prior to Admission medications   Medication Sig Start Date End Date Taking? Authorizing Provider  albuterol (VENTOLIN HFA) 108 (90 Base) MCG/ACT inhaler Inhale 2 puffs into the lungs every 4 (four) hours as needed. 12/18/20     apixaban (ELIQUIS) 5 MG TABS tablet TAKE 1 TABLET BY MOUTH TWICE DAILY 06/17/20 06/17/21  Margarito Courser, MD  Armodafinil 150 MG tablet Take 1 tablet (150 mg total) by mouth daily. 02/24/22     Armodafinil 50 MG tablet Take two tablets (100 mg dose) by mouth daily. 09/27/20     atorvastatin (LIPITOR) 10 MG tablet Take one tablet (10 mg dose) by mouth at bedtime. 10/07/21     atorvastatin (LIPITOR) 10 MG tablet Take 1 tablet (10 mg total) by mouth at bedtime. 01/12/22     busPIRone (BUSPAR) 10 MG  tablet Take 10 mg by mouth 2 (two) times daily. 04/15/18   [provider]  busPIRone (BUSPAR) 10 MG tablet Take 1 tablet by mouth 3 times a day 11/08/20     busPIRone (BUSPAR) 10 MG tablet Take 1 tablet (10 mg total) by mouth 3 (three) times daily. 02/14/21     busPIRone (BUSPAR) 10 MG tablet Take 1 tablet by mouth 3 times a day 05/21/21     busPIRone (BUSPAR) 10 MG tablet Take 1 tablet by mouth 3 times a day 05/21/21     carvedilol (COREG) 3.125 MG tablet Take 3.125 mg by mouth 2 (two) times daily. 12/25/19   [provider]  carvedilol (COREG) 3.125 MG tablet Take one tablet (3.125 mg dose) by mouth 2 (two) times daily. 10/07/21     carvedilol (COREG) 3.125 MG tablet Take 1 tablet (3.125 mg total) by mouth 2 (two) times daily. 01/12/22     cephALEXin (KEFLEX) 500 MG capsule Take 1 capsule (500 mg total) by mouth 2 (two) times daily. 01/23/22   Mar Daring, PA-C  chlorzoxazone (PARAFON) 500 MG tablet One tablet as needed up to four times daily for headache 02/19/20   [provider]  chlorzoxazone (PARAFON) 500 MG tablet Take 1 tablet by mouth up to four times daily for headache 07/29/20     chlorzoxazone (PARAFON) 500 MG tablet Take 1 tablet by mouth as needed up to four times daily for headache 10/06/21  citalopram (CELEXA) 10 MG tablet Take 10 mg by mouth daily. 05/16/18   [provider]  citalopram (CELEXA) 20 MG tablet Take 1 tablet by mouth at bedtime 01/14/21     citalopram (CELEXA) 20 MG tablet Take 1 tablet (20 mg total) by mouth at bedtime. 07/15/21     clonazePAM (KLONOPIN) 0.5 MG tablet Take 0.5 mg by mouth 2 (two) times daily.    [provider]  clonazePAM (KLONOPIN) 0.5 MG tablet Take 1/2 tablet (0.25 mg total) by mouth daily. 02/14/21     clonazePAM (KLONOPIN) 0.5 MG tablet Take 1 tablet by mouth daily 07/15/21     clonazePAM (KLONOPIN) 0.5 MG tablet Take 1 tablet (0.5 mg total) by mouth daily. 01/16/22     diclofenac (VOLTAREN) 75 MG EC tablet  Take 75 mg by mouth 2 (two) times daily. 10/09/19   [provider]  diclofenac (VOLTAREN) 75 MG EC tablet Take 1 tablet (75 mg total) by mouth 2 (two) times daily. 04/18/20   Wallene Huh, DPM  dicyclomine (BENTYL) 20 MG tablet Take one tablet (20 mg dose) by mouth 2 (two) times daily. 07/31/20     doxycycline (VIBRA-TABS) 100 MG tablet Take 1 tablet (100 mg total) by mouth 2 (two) times daily. 12/30/21   Brunetta Jeans, PA-C  Erenumab-aooe (AIMOVIG) 140 MG/ML SOAJ Inject 140 mg into the skin every 30 (thirty) days. 05/21/21     Erenumab-aooe (AIMOVIG) 140 MG/ML SOAJ Inject 140 mg into the skin every 30 (thirty) days. 06/16/21     Erenumab-aooe (AIMOVIG) 140 MG/ML SOAJ Inject 140 mg into the skin every 30 (thirty) days. 10/17/21     fenofibrate 160 MG tablet TAKE ONE TABLET (160 MG DOSE) BY MOUTH DAILY. 05/20/20 05/20/21  Margarito Courser, MD  fenofibrate 160 MG tablet Take 1 tablet (160 mg total) by mouth daily. 02/18/22     fluticasone (FLONASE) 50 MCG/ACT nasal spray Place 2 sprays into both nostrils daily. 11/26/21     gabapentin (NEURONTIN) 100 MG capsule Take one capsule (100 mg dose) by mouth at bedtime for 1 week and then increase to 2 capsules at bedtime for 1 week and then increase to 3 capsules at bedtime to continue 05/20/21     gabapentin (NEURONTIN) 300 MG capsule Take one capsule (300 mg dose) by mouth at bedtime. 09/30/21     gabapentin (NEURONTIN) 300 MG capsule Take one capsule (300 mg dose) by mouth at bedtime. 01/12/22     HYDROcodone-acetaminophen (NORCO/VICODIN) 5-325 MG tablet Take one tablet by mouth every 4 (four) hours as needed for Pain for up to 3 days. 07/17/20     ibuprofen (ADVIL) 800 MG tablet Take 1 tablet (800 mg total) by mouth every 8 (eight) hours with food as needed for pain 11/21/21   Mar Daring, PA-C  ipratropium (ATROVENT) 0.03 % nasal spray Place 2 sprays into both nostrils 3 (three) times daily as needed. 11/26/21     ketorolac (TORADOL) 10 MG tablet Take one  tablet (10 mg dose) by mouth every 6 (six) hours as needed. 07/17/20     metFORMIN (GLUCOPHAGE-XR) 750 MG 24 hr tablet Take 1 tablet (750 mg total) by mouth daily. 05/21/21     modafinil (PROVIGIL) 200 MG tablet TAKE 1 & 1/2 TABLETS BY MOUTH EVERY MORNING **NEEDS OFFICE VISIT FOR FURTHER REFILLS** 07/18/20     Norgestimate-Ethinyl Estradiol Triphasic 0.18/0.215/0.25 MG-25 MCG tab TAKE 1 TABLET BY MOUTH ONCE DAILY 01/16/20 01/15/21  Susanne Greenhouse, DO  omeprazole (PRILOSEC) 40 MG capsule TAKE 1 CAPSULE (40 MG TOTAL) BY MOUTH DAILY. 01/15/20 01/14/21  Evelina Dun A, FNP  omeprazole (PRILOSEC) 40 MG capsule Take 1 capsule by mouth daily. 02/18/22     ondansetron (ZOFRAN-ODT) 4 MG disintegrating tablet Take 1 tablet (4 mg total) by mouth every 8 (eight) hours as needed for nausea or vomiting. 11/07/21   Fransico Meadow, PA-C  oxcarbazepine (TRILEPTAL) 600 MG tablet Take 1 tablet by mouth daily every night at bedtime 08/05/20     oxcarbazepine (TRILEPTAL) 600 MG tablet Take 1 tablet by mouth every night at bedtime 08/05/20     oxcarbazepine (TRILEPTAL) 600 MG tablet Take 1 tablet by mouth once every night at bedtime 02/11/21     oxcarbazepine (TRILEPTAL) 600 MG tablet Take 1 tablet by mouth daily at bedtime 12/12/21     paliperidone (INVEGA SUSTENNA) 156 MG/ML SUSY injection Inject 4m once monthly as directed 08/05/20     paliperidone (INVEGA) 3 MG 24 hr tablet Take by mouth. 03/29/17   [provider]  paliperidone (INVEGA) 3 MG 24 hr tablet Take 1 tablet (3 mg total) by mouth at bedtime. 11/11/21     prazosin (MINIPRESS) 5 MG capsule Take 1 tablet by mouth at bedtime 06/19/21     prazosin (MINIPRESS) 5 MG capsule Take 1 capsule by mouth at bedtime 12/12/21     prochlorperazine (COMPAZINE) 10 MG tablet Take one tablet (10 mg dose) by mouth every 8 (eight) hours as needed. 09/30/21     prochlorperazine (COMPAZINE) 10 MG tablet Take one tablet (10 mg dose) by mouth every 8 (eight) hours as needed. 10/06/21      SUMAtriptan (IMITREX) 100 MG tablet Take 1 tablet by mouth at onset of headache. May repeat in 2 hours if headache pain persists. **Max of 2 tablets in 24 hours** 07/29/20     SUMAtriptan (IMITREX) 100 MG tablet One tablet at the onset of headache.  May repeat in two hours if the headache pain persists.  No more than 2 tablets in a 24 hour period. 05/21/21     topiramate (TOPAMAX) 100 MG tablet TAKE 1 TABLET BY MOUTH EVERY NIGHT AT BEDTIME 11/02/19 11/01/20  CPreston Fleeting FNP  topiramate (TOPAMAX) 100 MG tablet Take 1 tablet (100 mg total) by mouth at bedtime. 10/30/20     topiramate (TOPAMAX) 25 MG tablet Take one tablet at bedtime for one week, then two tablets at bedtime for one week, then three tablets at bedtime 07/07/19   [provider]  topiramate (TOPAMAX) 50 MG tablet Take three tablets (150 mg dose) by mouth at bedtime. 09/30/21     topiramate (TOPAMAX) 50 MG tablet Take 3 tablets (150 mg total) by mouth at bedtime. 12/30/21     TRI-LO-MARZIA 0.18/0.215/0.25 MG-25 MCG tab Take 1 tablet by mouth daily. 01/17/18   [provider]  triamcinolone cream (KENALOG) 0.1 % Apply on to the skin 2 times daily as needed 02/09/22   BMar Daring PA-C  valbenazine ([facility] 80 MG capsule Take one capsule by mouth daily 04/25/21     Valbenazine Tosylate 80 MG CAPS Take by mouth. 01/07/17   [provider]  viloxazine ER (QELBREE) 200 MG 24 hr capsule Take 2 capsules (400 mg total) by mouth daily. 04/25/21     viloxazine ER (QELBREE) 200 MG 24 hr capsule Take 2 capsules (400 mg total) by mouth daily. 10/31/21     viloxazine ER (QELBREE) 200 MG 24 hr capsule  Take 2 capsules (400 mg total) by mouth daily. 10/31/21         Allergies    Lamotrigine, Cephalexin, Clarithromycin, Amoxicillin-pot clavulanate, and Penicillins    Review of Systems   Review of Systems  Constitutional:  Positive for fatigue.  Respiratory:  Negative for chest tightness and shortness of breath.    Cardiovascular:  Negative for chest pain, palpitations and leg swelling.  Gastrointestinal:  Negative for abdominal pain, diarrhea, nausea and vomiting.  Neurological:  Positive for light-headedness.    Physical Exam Updated Vital Signs BP 102/63 (BP Location: Left Arm)   Pulse 80   Temp 98.2 F (36.8 C) (Oral)   Resp 17   Ht '5\' 4"'$  (1.626 m)   Wt 84.4 kg   SpO2 100%   BMI 31.93 kg/m  Physical Exam Vitals and nursing note reviewed.  Constitutional:      General: She is not in acute distress.    Appearance: Normal appearance. She is not ill-appearing.  HENT:     Head: Normocephalic and atraumatic.  Eyes:     General: No scleral icterus.    Conjunctiva/sclera: Conjunctivae normal.     Pupils: Pupils are equal, round, and reactive to light.  Cardiovascular:     Rate and Rhythm: Normal rate and regular rhythm.  Pulmonary:     Effort: Pulmonary effort is normal. No respiratory distress.     Breath sounds: No wheezing.  Abdominal:     General: Abdomen is flat.     Palpations: Abdomen is soft.     Tenderness: There is abdominal tenderness (Mild epigastric tenderness to palpation, none at rest).  Musculoskeletal:     Cervical back: Normal range of motion.     Right lower leg: No edema.     Left lower leg: No edema.  Skin:    General: Skin is warm and dry.     Findings: No rash.  Neurological:     Mental Status: She is alert and oriented to person, place, and time.     Gait: Gait normal.  Psychiatric:        Mood and Affect: Mood normal.     ED Results / Procedures / Treatments   Labs (all labs ordered are listed, but only abnormal results are displayed) Labs Reviewed  CBC WITH DIFFERENTIAL/PLATELET  COMPREHENSIVE METABOLIC PANEL  LIPASE, BLOOD  URINALYSIS, ROUTINE W REFLEX MICROSCOPIC  PREGNANCY, URINE    EKG None  Radiology No results found.  Procedures Procedures   Medications Ordered in ED Medications  sodium chloride 0.9 % bolus 1,000 mL (has no  administration in time range)    ED Course/ Medical Decision Making/ A&P                           Medical Decision Making Amount and/or Complexity of Data Reviewed Labs: ordered.  Risk Prescription drug management.   41 year old female presenting today with concern for hypotension and associated lightheadedness.  Differential includes but is not limited to orthostasis, dehydration and medication side effect.  This is not an exhaustive differential.    Past Medical History / Co-morbidities / Social History: Palpitations, ongoing lightheadedness since COVID in 2021   Additional history: Per external chart review patient saw cardiology yesterday.  Cardiology is scheduling her for an echo however because she says her symptoms have been going on for over a year they believe it is likely not cardiac.  They put in a referral to neurology.  Physical Exam: Pertinent physical exam findings include No pertinent findings  Lab Tests: I ordered, and personally interpreted labs.  The pertinent results include: Potassium 3.2 Trace hematuria     Medications: I ordered medication including fluid bolus. Reevaluation of the patient after these medicines showed that the patient improved. I have reviewed the patients home medicines and have made adjustments as needed.   Calcium also replaced  MDM/Disposition: This is a 41 year old female presenting today due to hypotension.  Had multiple episodes of hypotension.  When she was in our department today she had normal blood pressure on arrival.  She did become hypotensive when going from laying down to sitting and sitting to standing.  Orthostatics were negative by official measurements.  She endorsed some lightheadedness with these positional changes.  Also found to be mildly hypokalemic.  This was replaced.  She has a normal physical exam otherwise.  She denies any current symptoms and many of her complaints seem to be ongoing for multiple  months.  She was most concerned about the low blood pressures today.  I also have some suspicion that her older blood pressure machine is inaccurate.  She was instructed to take her blood pressure cuff to her PCP appointment next week for comparison.  She already has follow-up.  Instructed to hold her carvedilol until she is able to see PCP early next week.  All questions were answered and patient was discharged in stable and ambulatory condition    Final Clinical Impression(s) / ED Diagnoses Final diagnoses:  Hypokalemia  Hypotension, unspecified hypotension type    Rx / DC Orders ED Discharge Orders     None      Results and diagnoses were explained to the patient. Return precautions discussed in full. Patient had no additional questions and expressed complete understanding.   This chart was dictated using voice recognition software.  Despite best efforts to proofread,  errors can occur which can change the documentation meaning.    Rhae Hammock, PA-C 02/28/22 2121    Lennice Sites, DO 02/28/22 2129

## 2022-02-28 NOTE — Discharge Instructions (Addendum)
You came in today due to your low blood pressure and lightheadedness.  You were given a liter of fluid which should help bring your blood pressure up.  I also would like you to stop taking your carvedilol for the next 3 days.  On Monday you can call your doctor and touch base with them for recommendations.  Keep your follow-up appointment with them on Wednesday.  Your potassium was also low so you are given a supplement.  Read the information about low potassium attached to these discharge papers so that you can increase potassium in your diet.  Return with any worsening symptoms, it was a pleasure to meet you and we hope you feel better!

## 2022-03-04 ENCOUNTER — Other Ambulatory Visit (HOSPITAL_BASED_OUTPATIENT_CLINIC_OR_DEPARTMENT_OTHER): Payer: Self-pay

## 2022-03-04 DIAGNOSIS — E876 Hypokalemia: Secondary | ICD-10-CM | POA: Diagnosis not present

## 2022-03-04 DIAGNOSIS — E785 Hyperlipidemia, unspecified: Secondary | ICD-10-CM | POA: Diagnosis not present

## 2022-03-04 DIAGNOSIS — Z2821 Immunization not carried out because of patient refusal: Secondary | ICD-10-CM | POA: Diagnosis not present

## 2022-03-04 DIAGNOSIS — I9589 Other hypotension: Secondary | ICD-10-CM | POA: Diagnosis not present

## 2022-03-04 DIAGNOSIS — E221 Hyperprolactinemia: Secondary | ICD-10-CM | POA: Diagnosis not present

## 2022-03-04 MED ORDER — CLONAZEPAM 0.5 MG PO TABS
0.5000 mg | ORAL_TABLET | Freq: Every day | ORAL | 0 refills | Status: DC
  Filled 2022-03-04: qty 30, 30d supply, fill #0

## 2022-03-04 MED ORDER — QELBREE 200 MG PO CP24
400.0000 mg | ORAL_CAPSULE | Freq: Every day | ORAL | 2 refills | Status: DC
  Filled 2022-03-04 (×2): qty 60, 30d supply, fill #0

## 2022-03-04 MED ORDER — PRAZOSIN HCL 5 MG PO CAPS
5.0000 mg | ORAL_CAPSULE | Freq: Every day | ORAL | 0 refills | Status: AC
  Filled 2022-03-04: qty 90, 90d supply, fill #0

## 2022-03-06 ENCOUNTER — Other Ambulatory Visit (HOSPITAL_BASED_OUTPATIENT_CLINIC_OR_DEPARTMENT_OTHER): Payer: Self-pay

## 2022-03-09 ENCOUNTER — Other Ambulatory Visit (HOSPITAL_BASED_OUTPATIENT_CLINIC_OR_DEPARTMENT_OTHER): Payer: Self-pay

## 2022-03-10 ENCOUNTER — Other Ambulatory Visit (HOSPITAL_BASED_OUTPATIENT_CLINIC_OR_DEPARTMENT_OTHER): Payer: Self-pay

## 2022-03-12 ENCOUNTER — Other Ambulatory Visit (HOSPITAL_BASED_OUTPATIENT_CLINIC_OR_DEPARTMENT_OTHER): Payer: Self-pay

## 2022-03-18 ENCOUNTER — Other Ambulatory Visit (HOSPITAL_BASED_OUTPATIENT_CLINIC_OR_DEPARTMENT_OTHER): Payer: Self-pay

## 2022-03-20 ENCOUNTER — Other Ambulatory Visit (HOSPITAL_BASED_OUTPATIENT_CLINIC_OR_DEPARTMENT_OTHER): Payer: Self-pay

## 2022-03-20 MED ORDER — METFORMIN HCL ER 750 MG PO TB24: 750.0000 mg | ORAL_TABLET | Freq: Every day | ORAL | 2 refills | Status: AC

## 2022-03-20 MED ORDER — PALIPERIDONE ER 3 MG PO TB24
3.0000 mg | ORAL_TABLET | Freq: Every day | ORAL | 1 refills | Status: AC
  Filled 2022-03-20: qty 30, 30d supply, fill #0

## 2022-03-20 MED ORDER — OXCARBAZEPINE 600 MG PO TABS
600.0000 mg | ORAL_TABLET | Freq: Every day | ORAL | 0 refills | Status: DC
  Filled 2022-03-20: qty 90, 90d supply, fill #0

## 2022-03-23 ENCOUNTER — Other Ambulatory Visit (HOSPITAL_BASED_OUTPATIENT_CLINIC_OR_DEPARTMENT_OTHER): Payer: Self-pay

## 2022-03-24 ENCOUNTER — Other Ambulatory Visit: Payer: Self-pay

## 2022-03-24 ENCOUNTER — Emergency Department (HOSPITAL_BASED_OUTPATIENT_CLINIC_OR_DEPARTMENT_OTHER)
Admission: EM | Admit: 2022-03-24 | Discharge: 2022-03-24 | Disposition: A | Discharge status: Home / Self Care | Payer: 59 | Attending: Emergency Medicine | Admitting: Emergency Medicine

## 2022-03-24 ENCOUNTER — Emergency Department (HOSPITAL_BASED_OUTPATIENT_CLINIC_OR_DEPARTMENT_OTHER): Payer: 59

## 2022-03-24 ENCOUNTER — Other Ambulatory Visit (HOSPITAL_BASED_OUTPATIENT_CLINIC_OR_DEPARTMENT_OTHER): Payer: Self-pay

## 2022-03-24 DIAGNOSIS — R110 Nausea: Secondary | ICD-10-CM | POA: Diagnosis not present

## 2022-03-24 DIAGNOSIS — Y92410 Unspecified street and highway as the place of occurrence of the external cause: Secondary | ICD-10-CM | POA: Diagnosis not present

## 2022-03-24 DIAGNOSIS — R519 Headache, unspecified: Secondary | ICD-10-CM | POA: Insufficient documentation

## 2022-03-24 DIAGNOSIS — S0990XA Unspecified injury of head, initial encounter: Secondary | ICD-10-CM | POA: Diagnosis not present

## 2022-03-24 DIAGNOSIS — M542 Cervicalgia: Secondary | ICD-10-CM | POA: Insufficient documentation

## 2022-03-24 DIAGNOSIS — Z7901 Long term (current) use of anticoagulants: Secondary | ICD-10-CM | POA: Insufficient documentation

## 2022-03-24 DIAGNOSIS — S199XXA Unspecified injury of neck, initial encounter: Secondary | ICD-10-CM | POA: Diagnosis not present

## 2022-03-24 MED ORDER — METHOCARBAMOL 500 MG PO TABS
500.0000 mg | ORAL_TABLET | Freq: Two times a day (BID) | ORAL | 0 refills | Status: DC
  Filled 2022-03-24: qty 20, 10d supply, fill #0

## 2022-03-24 MED ORDER — ARMODAFINIL 150 MG PO TABS
150.0000 mg | ORAL_TABLET | Freq: Every day | ORAL | 0 refills | Status: DC
  Filled 2022-03-24 – 2022-04-01 (×2): qty 30, 30d supply, fill #0

## 2022-03-24 MED ORDER — PROCHLORPERAZINE EDISYLATE 10 MG/2ML IJ SOLN
10.0000 mg | Freq: Once | INTRAMUSCULAR | Status: AC
  Administered 2022-03-24: 10 mg via INTRAMUSCULAR
  Filled 2022-03-24: qty 2

## 2022-03-24 MED ORDER — KETOROLAC TROMETHAMINE 15 MG/ML IJ SOLN
15.0000 mg | Freq: Once | INTRAMUSCULAR | Status: AC
  Administered 2022-03-24: 15 mg via INTRAMUSCULAR
  Filled 2022-03-24: qty 1
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
## 2022-03-24 NOTE — Discharge Instructions (Signed)
Your workup today was overall reassuring.  CT scan of the head and neck did not show any concerning findings.  Your headache could be your typical migraine headache.  You had some improvement after the medicine since you were given in the emergency room.  It could also be a concussion.  I have given information regarding concussion.  This typically last for a week.  You may notice things that require focus may cause headache.  You may also have sensitivity to light.  She had improvement after a week.  For any significant nausea or unrelated vomiting with inability to keep anything down, severe headache please return for evaluation.  Otherwise follow-up with your primary care provider.  Continue to take the medicine as you typically take at home for migraine headaches.

## 2022-03-24 NOTE — ED Triage Notes (Signed)
Pt states she feels nauseated and head feels like a migraine.

## 2022-04-01 ENCOUNTER — Other Ambulatory Visit (HOSPITAL_BASED_OUTPATIENT_CLINIC_OR_DEPARTMENT_OTHER): Payer: Self-pay

## 2022-04-07 ENCOUNTER — Other Ambulatory Visit (HOSPITAL_BASED_OUTPATIENT_CLINIC_OR_DEPARTMENT_OTHER): Payer: Self-pay

## 2022-04-07 DIAGNOSIS — F410 Panic disorder [episodic paroxysmal anxiety] without agoraphobia: Secondary | ICD-10-CM | POA: Diagnosis not present

## 2022-04-07 DIAGNOSIS — F902 Attention-deficit hyperactivity disorder, combined type: Secondary | ICD-10-CM | POA: Diagnosis not present

## 2022-04-07 DIAGNOSIS — F250 Schizoaffective disorder, bipolar type: Secondary | ICD-10-CM | POA: Diagnosis not present

## 2022-04-07 DIAGNOSIS — F429 Obsessive-compulsive disorder, unspecified: Secondary | ICD-10-CM | POA: Diagnosis not present

## 2022-04-07 DIAGNOSIS — Z79899 Other long term (current) drug therapy: Secondary | ICD-10-CM | POA: Diagnosis not present

## 2022-04-07 DIAGNOSIS — F4310 Post-traumatic stress disorder, unspecified: Secondary | ICD-10-CM | POA: Diagnosis not present

## 2022-04-07 MED ORDER — BUSPIRONE HCL 10 MG PO TABS
10.0000 mg | ORAL_TABLET | Freq: Three times a day (TID) | ORAL | 0 refills | Status: AC
  Filled 2022-04-07: qty 270, 90d supply, fill #0

## 2022-04-07 MED ORDER — CLONAZEPAM 0.5 MG PO TABS
0.5000 mg | ORAL_TABLET | Freq: Every day | ORAL | 2 refills | Status: AC
  Filled 2022-04-07: qty 30, 30d supply, fill #0

## 2022-04-07 MED ORDER — INGREZZA 80 MG PO CAPS
80.0000 mg | ORAL_CAPSULE | Freq: Every day | ORAL | 3 refills | Status: DC
  Filled 2022-04-07: qty 90, 90d supply, fill #0

## 2022-04-07 MED ORDER — ATOMOXETINE HCL 40 MG PO CAPS
40.0000 mg | ORAL_CAPSULE | Freq: Every day | ORAL | 1 refills | Status: AC
  Filled 2022-04-07: qty 30, 30d supply, fill #0

## 2022-04-14 ENCOUNTER — Other Ambulatory Visit (HOSPITAL_BASED_OUTPATIENT_CLINIC_OR_DEPARTMENT_OTHER): Payer: Self-pay

## 2022-04-15 ENCOUNTER — Other Ambulatory Visit (HOSPITAL_BASED_OUTPATIENT_CLINIC_OR_DEPARTMENT_OTHER): Payer: Self-pay

## 2022-04-21 ENCOUNTER — Other Ambulatory Visit (HOSPITAL_BASED_OUTPATIENT_CLINIC_OR_DEPARTMENT_OTHER): Payer: Self-pay

## 2022-04-21 MED ORDER — ARMODAFINIL 150 MG PO TABS
150.0000 mg | ORAL_TABLET | Freq: Every day | ORAL | 0 refills | Status: AC
  Filled 2022-04-21: qty 30, 30d supply, fill #0

## 2022-04-22 ENCOUNTER — Other Ambulatory Visit (HOSPITAL_BASED_OUTPATIENT_CLINIC_OR_DEPARTMENT_OTHER): Payer: Self-pay

## 2022-04-23 ENCOUNTER — Other Ambulatory Visit (HOSPITAL_BASED_OUTPATIENT_CLINIC_OR_DEPARTMENT_OTHER): Payer: Self-pay

## 2022-04-24 ENCOUNTER — Other Ambulatory Visit (HOSPITAL_BASED_OUTPATIENT_CLINIC_OR_DEPARTMENT_OTHER): Payer: Self-pay

## 2022-04-27 ENCOUNTER — Other Ambulatory Visit (HOSPITAL_BASED_OUTPATIENT_CLINIC_OR_DEPARTMENT_OTHER): Payer: Self-pay

## 2022-04-28 ENCOUNTER — Other Ambulatory Visit (HOSPITAL_BASED_OUTPATIENT_CLINIC_OR_DEPARTMENT_OTHER): Payer: Self-pay

## 2022-04-29 ENCOUNTER — Other Ambulatory Visit (HOSPITAL_BASED_OUTPATIENT_CLINIC_OR_DEPARTMENT_OTHER): Payer: Self-pay

## 2022-04-29 MED ORDER — TOPIRAMATE 50 MG PO TABS
150.0000 mg | ORAL_TABLET | Freq: Every day | ORAL | 3 refills | Status: DC
  Filled 2022-04-29: qty 270, 90d supply, fill #0

## 2022-04-29 MED ORDER — AIMOVIG 140 MG/ML ~~LOC~~ SOAJ
140.0000 mg | SUBCUTANEOUS | 3 refills | Status: DC
  Filled 2022-04-29: qty 1, 30d supply, fill #0

## 2022-04-30 ENCOUNTER — Other Ambulatory Visit (HOSPITAL_BASED_OUTPATIENT_CLINIC_OR_DEPARTMENT_OTHER): Payer: Self-pay

## 2022-05-01 ENCOUNTER — Other Ambulatory Visit (HOSPITAL_BASED_OUTPATIENT_CLINIC_OR_DEPARTMENT_OTHER): Payer: Self-pay

## 2022-05-05 ENCOUNTER — Other Ambulatory Visit (HOSPITAL_BASED_OUTPATIENT_CLINIC_OR_DEPARTMENT_OTHER): Payer: Self-pay

## 2022-05-07 ENCOUNTER — Other Ambulatory Visit (HOSPITAL_BASED_OUTPATIENT_CLINIC_OR_DEPARTMENT_OTHER): Payer: Self-pay

## 2022-05-08 ENCOUNTER — Other Ambulatory Visit (HOSPITAL_BASED_OUTPATIENT_CLINIC_OR_DEPARTMENT_OTHER): Payer: Self-pay

## 2022-05-11 ENCOUNTER — Other Ambulatory Visit (HOSPITAL_BASED_OUTPATIENT_CLINIC_OR_DEPARTMENT_OTHER): Payer: Self-pay

## 2022-05-14 ENCOUNTER — Other Ambulatory Visit: Payer: Self-pay

## 2022-05-14 ENCOUNTER — Other Ambulatory Visit (HOSPITAL_BASED_OUTPATIENT_CLINIC_OR_DEPARTMENT_OTHER): Payer: Self-pay

## 2022-05-15 ENCOUNTER — Other Ambulatory Visit (HOSPITAL_BASED_OUTPATIENT_CLINIC_OR_DEPARTMENT_OTHER): Payer: Self-pay

## 2022-05-15 MED ORDER — CLINDAMYCIN HCL 300 MG PO CAPS
300.0000 mg | ORAL_CAPSULE | Freq: Four times a day (QID) | ORAL | 0 refills | Status: DC
  Filled 2022-05-15: qty 28, 7d supply, fill #0

## 2022-05-15 MED ORDER — PROMETHAZINE HCL 25 MG PO TABS
ORAL_TABLET | ORAL | 0 refills | Status: DC
  Filled 2022-05-15: qty 12, 3d supply, fill #0

## 2022-05-15 MED ORDER — OXYCODONE-ACETAMINOPHEN 5-325 MG PO TABS
ORAL_TABLET | ORAL | 0 refills | Status: DC
  Filled 2022-05-15: qty 15, 3d supply, fill #0

## 2022-05-18 ENCOUNTER — Other Ambulatory Visit (HOSPITAL_BASED_OUTPATIENT_CLINIC_OR_DEPARTMENT_OTHER): Payer: Self-pay

## 2022-05-19 ENCOUNTER — Other Ambulatory Visit (HOSPITAL_BASED_OUTPATIENT_CLINIC_OR_DEPARTMENT_OTHER): Payer: Self-pay

## 2022-05-20 ENCOUNTER — Other Ambulatory Visit (HOSPITAL_BASED_OUTPATIENT_CLINIC_OR_DEPARTMENT_OTHER): Payer: Self-pay

## 2022-05-21 ENCOUNTER — Other Ambulatory Visit (HOSPITAL_BASED_OUTPATIENT_CLINIC_OR_DEPARTMENT_OTHER): Payer: Self-pay

## 2022-05-21 MED ORDER — SULFAMETHOXAZOLE-TRIMETHOPRIM 800-160 MG PO TABS
1.0000 | ORAL_TABLET | Freq: Two times a day (BID) | ORAL | 0 refills | Status: DC
  Filled 2022-05-21: qty 14, 7d supply, fill #0

## 2022-05-21 MED ORDER — CLINDAMYCIN HCL 300 MG PO CAPS
300.0000 mg | ORAL_CAPSULE | Freq: Four times a day (QID) | ORAL | 0 refills | Status: DC
  Filled 2022-05-21: qty 28, 7d supply, fill #0

## 2022-05-25 ENCOUNTER — Other Ambulatory Visit (HOSPITAL_BASED_OUTPATIENT_CLINIC_OR_DEPARTMENT_OTHER): Payer: Self-pay

## 2022-05-28 ENCOUNTER — Other Ambulatory Visit (HOSPITAL_BASED_OUTPATIENT_CLINIC_OR_DEPARTMENT_OTHER): Payer: Self-pay

## 2022-05-29 ENCOUNTER — Other Ambulatory Visit (HOSPITAL_BASED_OUTPATIENT_CLINIC_OR_DEPARTMENT_OTHER): Payer: Self-pay

## 2022-06-01 ENCOUNTER — Other Ambulatory Visit (HOSPITAL_BASED_OUTPATIENT_CLINIC_OR_DEPARTMENT_OTHER): Payer: Self-pay

## 2022-06-02 ENCOUNTER — Other Ambulatory Visit (HOSPITAL_BASED_OUTPATIENT_CLINIC_OR_DEPARTMENT_OTHER): Payer: Self-pay

## 2022-06-05 ENCOUNTER — Other Ambulatory Visit (HOSPITAL_BASED_OUTPATIENT_CLINIC_OR_DEPARTMENT_OTHER): Payer: Self-pay

## 2022-06-05 MED ORDER — FENOFIBRATE 160 MG PO TABS
160.0000 mg | ORAL_TABLET | Freq: Every day | ORAL | 1 refills | Status: DC
  Filled 2022-06-05: qty 90, 90d supply, fill #0

## 2022-06-08 ENCOUNTER — Other Ambulatory Visit (HOSPITAL_BASED_OUTPATIENT_CLINIC_OR_DEPARTMENT_OTHER): Payer: Self-pay

## 2022-06-16 ENCOUNTER — Other Ambulatory Visit (HOSPITAL_BASED_OUTPATIENT_CLINIC_OR_DEPARTMENT_OTHER): Payer: Self-pay

## 2022-06-18 ENCOUNTER — Other Ambulatory Visit (HOSPITAL_BASED_OUTPATIENT_CLINIC_OR_DEPARTMENT_OTHER): Payer: Self-pay

## 2022-06-19 ENCOUNTER — Other Ambulatory Visit (HOSPITAL_BASED_OUTPATIENT_CLINIC_OR_DEPARTMENT_OTHER): Payer: Self-pay

## 2022-06-23 ENCOUNTER — Other Ambulatory Visit (HOSPITAL_BASED_OUTPATIENT_CLINIC_OR_DEPARTMENT_OTHER): Payer: Self-pay

## 2022-06-25 ENCOUNTER — Other Ambulatory Visit (HOSPITAL_BASED_OUTPATIENT_CLINIC_OR_DEPARTMENT_OTHER): Payer: Self-pay

## 2022-06-26 ENCOUNTER — Other Ambulatory Visit (HOSPITAL_BASED_OUTPATIENT_CLINIC_OR_DEPARTMENT_OTHER): Payer: Self-pay

## 2022-06-29 ENCOUNTER — Other Ambulatory Visit (HOSPITAL_BASED_OUTPATIENT_CLINIC_OR_DEPARTMENT_OTHER): Payer: Self-pay

## 2022-06-30 ENCOUNTER — Other Ambulatory Visit (HOSPITAL_BASED_OUTPATIENT_CLINIC_OR_DEPARTMENT_OTHER): Payer: Self-pay

## 2022-07-01 ENCOUNTER — Other Ambulatory Visit (HOSPITAL_BASED_OUTPATIENT_CLINIC_OR_DEPARTMENT_OTHER): Payer: Self-pay

## 2022-07-02 ENCOUNTER — Other Ambulatory Visit (HOSPITAL_BASED_OUTPATIENT_CLINIC_OR_DEPARTMENT_OTHER): Payer: Self-pay

## 2022-07-02 MED ORDER — FLUCONAZOLE 150 MG PO TABS
150.0000 mg | ORAL_TABLET | Freq: Once | ORAL | 0 refills | Status: AC
  Filled 2022-07-02: qty 1, 1d supply, fill #0

## 2022-07-02 MED ORDER — NITROFURANTOIN MONOHYD MACRO 100 MG PO CAPS
100.0000 mg | ORAL_CAPSULE | Freq: Two times a day (BID) | ORAL | 0 refills | Status: DC
  Filled 2022-07-02: qty 10, 5d supply, fill #0

## 2022-07-06 ENCOUNTER — Other Ambulatory Visit (HOSPITAL_BASED_OUTPATIENT_CLINIC_OR_DEPARTMENT_OTHER): Payer: Self-pay

## 2022-07-06 MED ORDER — AIMOVIG 140 MG/ML ~~LOC~~ SOAJ
140.0000 mg | SUBCUTANEOUS | 11 refills | Status: AC
  Filled 2022-07-06: qty 1, 30d supply, fill #0

## 2022-07-23 ENCOUNTER — Other Ambulatory Visit (HOSPITAL_BASED_OUTPATIENT_CLINIC_OR_DEPARTMENT_OTHER): Payer: Self-pay

## 2022-08-23 ENCOUNTER — Telehealth: Payer: Self-pay | Admitting: Family

## 2022-08-23 DIAGNOSIS — J019 Acute sinusitis, unspecified: Secondary | ICD-10-CM

## 2022-08-23 MED ORDER — DOXYCYCLINE HYCLATE 100 MG PO TABS: 100.0000 mg | ORAL_TABLET | Freq: Two times a day (BID) | ORAL | 0 refills | Status: DC

## 2022-08-23 NOTE — Progress Notes (Signed)

## 2022-10-04 ENCOUNTER — Observation Stay (HOSPITAL_BASED_OUTPATIENT_CLINIC_OR_DEPARTMENT_OTHER)
Admission: EM | Admit: 2022-10-04 | Discharge: 2022-10-05 | Disposition: A | Discharge status: Home / Self Care | Payer: BC Managed Care – PPO | Attending: Emergency Medicine | Admitting: Emergency Medicine

## 2022-10-04 ENCOUNTER — Other Ambulatory Visit: Payer: Self-pay

## 2022-10-04 ENCOUNTER — Emergency Department (HOSPITAL_BASED_OUTPATIENT_CLINIC_OR_DEPARTMENT_OTHER): Payer: BC Managed Care – PPO

## 2022-10-04 ENCOUNTER — Encounter (HOSPITAL_BASED_OUTPATIENT_CLINIC_OR_DEPARTMENT_OTHER): Payer: Self-pay

## 2022-10-04 DIAGNOSIS — Z823 Family history of stroke: Secondary | ICD-10-CM | POA: Diagnosis not present

## 2022-10-04 DIAGNOSIS — Z880 Allergy status to penicillin: Secondary | ICD-10-CM | POA: Diagnosis not present

## 2022-10-04 DIAGNOSIS — G43909 Migraine, unspecified, not intractable, without status migrainosus: Secondary | ICD-10-CM | POA: Diagnosis present

## 2022-10-04 DIAGNOSIS — K8000 Calculus of gallbladder with acute cholecystitis without obstruction: Secondary | ICD-10-CM | POA: Diagnosis present

## 2022-10-04 DIAGNOSIS — Z7901 Long term (current) use of anticoagulants: Secondary | ICD-10-CM | POA: Diagnosis not present

## 2022-10-04 DIAGNOSIS — Z87440 Personal history of urinary (tract) infections: Secondary | ICD-10-CM

## 2022-10-04 DIAGNOSIS — R1011 Right upper quadrant pain: Secondary | ICD-10-CM | POA: Diagnosis present

## 2022-10-04 DIAGNOSIS — Z8049 Family history of malignant neoplasm of other genital organs: Secondary | ICD-10-CM | POA: Diagnosis not present

## 2022-10-04 DIAGNOSIS — F259 Schizoaffective disorder, unspecified: Secondary | ICD-10-CM | POA: Diagnosis present

## 2022-10-04 DIAGNOSIS — K810 Acute cholecystitis: Principal | ICD-10-CM

## 2022-10-04 DIAGNOSIS — Z7984 Long term (current) use of oral hypoglycemic drugs: Secondary | ICD-10-CM | POA: Diagnosis not present

## 2022-10-04 DIAGNOSIS — Z888 Allergy status to other drugs, medicaments and biological substances status: Secondary | ICD-10-CM

## 2022-10-04 DIAGNOSIS — Z79899 Other long term (current) drug therapy: Secondary | ICD-10-CM | POA: Diagnosis not present

## 2022-10-04 DIAGNOSIS — Z881 Allergy status to other antibiotic agents status: Secondary | ICD-10-CM | POA: Diagnosis not present

## 2022-10-04 DIAGNOSIS — K8012 Calculus of gallbladder with acute and chronic cholecystitis without obstruction: Principal | ICD-10-CM | POA: Insufficient documentation

## 2022-10-04 DIAGNOSIS — F419 Anxiety disorder, unspecified: Secondary | ICD-10-CM | POA: Diagnosis present

## 2022-10-04 DIAGNOSIS — K8010 Calculus of gallbladder with chronic cholecystitis without obstruction: Secondary | ICD-10-CM | POA: Diagnosis present

## 2022-10-04 DIAGNOSIS — F17210 Nicotine dependence, cigarettes, uncomplicated: Secondary | ICD-10-CM | POA: Insufficient documentation

## 2022-10-04 DIAGNOSIS — R112 Nausea with vomiting, unspecified: Secondary | ICD-10-CM | POA: Diagnosis present

## 2022-10-04 DIAGNOSIS — Z8249 Family history of ischemic heart disease and other diseases of the circulatory system: Secondary | ICD-10-CM

## 2022-10-04 LAB — CBC
HCT: 39.7 % (ref 36.0–46.0)
HCT: 40 % (ref 36.0–46.0)
Hemoglobin: 13.3 g/dL (ref 12.0–15.0)
Hemoglobin: 13.3 g/dL (ref 12.0–15.0)
MCH: 28.8 pg (ref 26.0–34.0)
MCH: 29.1 pg (ref 26.0–34.0)
MCHC: 33.3 g/dL (ref 30.0–36.0)
MCHC: 33.5 g/dL (ref 30.0–36.0)
MCV: 86.6 fL (ref 80.0–100.0)
MCV: 86.9 fL (ref 80.0–100.0)
Platelets: 384 10*3/uL (ref 150–400)
Platelets: 389 10*3/uL (ref 150–400)
RBC: 4.57 MIL/uL (ref 3.87–5.11)
RBC: 4.62 MIL/uL (ref 3.87–5.11)
RDW: 13.1 % (ref 11.5–15.5)
RDW: 13.1 % (ref 11.5–15.5)
WBC: 23.8 10*3/uL — ABNORMAL HIGH (ref 4.0–10.5)
WBC: 24 10*3/uL — ABNORMAL HIGH (ref 4.0–10.5)
nRBC: 0 % (ref 0.0–0.2)
nRBC: 0 % (ref 0.0–0.2)

## 2022-10-04 LAB — COMPREHENSIVE METABOLIC PANEL
ALT: 16 U/L (ref 0–44)
AST: 20 U/L (ref 15–41)
Albumin: 3.9 g/dL (ref 3.5–5.0)
Alkaline Phosphatase: 28 U/L — ABNORMAL LOW (ref 38–126)
Anion gap: 11 (ref 5–15)
BUN: 21 mg/dL — ABNORMAL HIGH (ref 6–20)
CO2: 19 mmol/L — ABNORMAL LOW (ref 22–32)
Calcium: 9.2 mg/dL (ref 8.9–10.3)
Chloride: 107 mmol/L (ref 98–111)
Creatinine, Ser: 0.78 mg/dL (ref 0.44–1.00)
GFR, Estimated: >60 mL/min (ref >60)
Glucose, Bld: 110 mg/dL — ABNORMAL HIGH (ref 70–99)
Potassium: 3.8 mmol/L (ref 3.5–5.1)
Sodium: 137 mmol/L (ref 135–145)
Total Bilirubin: 0.4 mg/dL (ref 0.3–1.2)
Total Protein: 7.4 g/dL (ref 6.5–8.1)

## 2022-10-04 LAB — DIFFERENTIAL
Abs Immature Granulocytes: 0.09 10*3/uL — ABNORMAL HIGH (ref 0.00–0.07)
Basophils Absolute: 0.1 10*3/uL (ref 0.0–0.1)
Basophils Relative: 1 %
Eosinophils Absolute: 0.8 10*3/uL — ABNORMAL HIGH (ref 0.0–0.5)
Eosinophils Relative: 3 %
Immature Granulocytes: 0 %
Lymphocytes Relative: 10 %
Lymphs Abs: 2.5 10*3/uL (ref 0.7–4.0)
Monocytes Absolute: 1.4 10*3/uL — ABNORMAL HIGH (ref 0.1–1.0)
Monocytes Relative: 6 %
Neutro Abs: 19.1 10*3/uL — ABNORMAL HIGH (ref 1.7–7.7)
Neutrophils Relative %: 80 %

## 2022-10-04 LAB — LIPASE, BLOOD: Lipase: 31 U/L (ref 11–51)

## 2022-10-04 LAB — URINALYSIS, ROUTINE W REFLEX MICROSCOPIC
Glucose, UA: NEGATIVE mg/dL
Hgb urine dipstick: NEGATIVE
Ketones, ur: NEGATIVE mg/dL
Leukocytes,Ua: NEGATIVE
Nitrite: NEGATIVE
Protein, ur: NEGATIVE mg/dL
Specific Gravity, Urine: >=1.03 (ref 1.005–1.030)
pH: 5 (ref 5.0–8.0)

## 2022-10-04 LAB — PREGNANCY, URINE: Preg Test, Ur: NEGATIVE

## 2022-10-04 MED ORDER — MORPHINE SULFATE (PF) 2 MG/ML IV SOLN
1.0000 mg | INTRAVENOUS | Status: DC | PRN
  Administered 2022-10-04 – 2022-10-05 (×3): 4 mg via INTRAVENOUS
  Filled 2022-10-04 (×3): qty 2

## 2022-10-04 MED ORDER — ONDANSETRON 4 MG PO TBDP: 4.0000 mg | ORAL_TABLET | Freq: Four times a day (QID) | ORAL | Status: DC | PRN

## 2022-10-04 MED ORDER — VALBENAZINE TOSYLATE 40 MG PO CAPS
80.0000 mg | ORAL_CAPSULE | Freq: Every evening | ORAL | Status: DC
  Administered 2022-10-04 – 2022-10-05 (×2): 80 mg via ORAL
  Filled 2022-10-04 (×2): qty 2

## 2022-10-04 MED ORDER — CIPROFLOXACIN IN D5W 400 MG/200ML IV SOLN
400.0000 mg | Freq: Once | INTRAVENOUS | Status: AC
  Administered 2022-10-04: 400 mg via INTRAVENOUS
  Filled 2022-10-04 (×2): qty 200

## 2022-10-04 MED ORDER — POTASSIUM CHLORIDE IN NACL 20-0.9 MEQ/L-% IV SOLN
INTRAVENOUS | Status: DC
  Filled 2022-10-04 (×2): qty 1000

## 2022-10-04 MED ORDER — PANTOPRAZOLE SODIUM 40 MG IV SOLR
40.0000 mg | Freq: Every day | INTRAVENOUS | Status: DC
  Administered 2022-10-04: 40 mg via INTRAVENOUS
  Filled 2022-10-04: qty 10

## 2022-10-04 MED ORDER — ATOMOXETINE HCL 40 MG PO CAPS
40.0000 mg | ORAL_CAPSULE | Freq: Every day | ORAL | Status: DC
  Filled 2022-10-04: qty 1

## 2022-10-04 MED ORDER — ONDANSETRON HCL 4 MG/2ML IJ SOLN: 4.0000 mg | Freq: Four times a day (QID) | INTRAMUSCULAR | Status: DC | PRN

## 2022-10-04 MED ORDER — MORPHINE SULFATE (PF) 4 MG/ML IV SOLN
4.0000 mg | Freq: Once | INTRAVENOUS | Status: AC
  Administered 2022-10-04: 4 mg via INTRAVENOUS
  Filled 2022-10-04: qty 1

## 2022-10-04 MED ORDER — CLONAZEPAM 0.5 MG PO TABS
0.5000 mg | ORAL_TABLET | Freq: Every day | ORAL | Status: DC
  Administered 2022-10-04 – 2022-10-05 (×2): 0.5 mg via ORAL
  Filled 2022-10-04 (×2): qty 1

## 2022-10-04 MED ORDER — ALBUTEROL SULFATE HFA 108 (90 BASE) MCG/ACT IN AERS: 2.0000 | INHALATION_SPRAY | RESPIRATORY_TRACT | Status: DC | PRN

## 2022-10-04 MED ORDER — ARMODAFINIL 150 MG PO TABS: 150.0000 mg | ORAL_TABLET | Freq: Every day | ORAL | Status: DC

## 2022-10-04 MED ORDER — BUSPIRONE HCL 5 MG PO TABS
10.0000 mg | ORAL_TABLET | Freq: Three times a day (TID) | ORAL | Status: DC
  Administered 2022-10-04 – 2022-10-05 (×2): 10 mg via ORAL
  Filled 2022-10-04 (×3): qty 2

## 2022-10-04 MED ORDER — PALIPERIDONE ER 3 MG PO TB24
3.0000 mg | ORAL_TABLET | Freq: Every day | ORAL | Status: DC
  Administered 2022-10-04: 3 mg via ORAL
  Filled 2022-10-04 (×2): qty 1

## 2022-10-04 MED ORDER — ONDANSETRON HCL 4 MG/2ML IJ SOLN
4.0000 mg | Freq: Once | INTRAMUSCULAR | Status: AC
  Administered 2022-10-04: 4 mg via INTRAVENOUS
  Filled 2022-10-04: qty 2

## 2022-10-04 MED ORDER — METRONIDAZOLE 500 MG/100ML IV SOLN
500.0000 mg | Freq: Two times a day (BID) | INTRAVENOUS | Status: DC
  Administered 2022-10-04 – 2022-10-05 (×2): 500 mg via INTRAVENOUS
  Filled 2022-10-04 (×2): qty 100

## 2022-10-04 MED ORDER — OXCARBAZEPINE 300 MG PO TABS
600.0000 mg | ORAL_TABLET | Freq: Every day | ORAL | Status: DC
  Administered 2022-10-04: 600 mg via ORAL
  Filled 2022-10-04 (×2): qty 2

## 2022-10-04 MED ORDER — CIPROFLOXACIN IN D5W 400 MG/200ML IV SOLN
400.0000 mg | Freq: Two times a day (BID) | INTRAVENOUS | Status: DC
  Administered 2022-10-04 – 2022-10-05 (×2): 400 mg via INTRAVENOUS
  Filled 2022-10-04 (×2): qty 200

## 2022-10-04 MED ORDER — TOPIRAMATE 25 MG PO TABS
150.0000 mg | ORAL_TABLET | Freq: Every day | ORAL | Status: DC
  Administered 2022-10-04: 150 mg via ORAL
  Filled 2022-10-04: qty 2

## 2022-10-04 MED ORDER — GABAPENTIN 100 MG PO CAPS
300.0000 mg | ORAL_CAPSULE | Freq: Every day | ORAL | Status: DC
  Administered 2022-10-04: 300 mg via ORAL
  Filled 2022-10-04: qty 3

## 2022-10-04 MED ORDER — SODIUM CHLORIDE 0.9 % IV BOLUS
1000.0000 mL | Freq: Once | INTRAVENOUS | Status: AC
  Administered 2022-10-04: 1000 mL via INTRAVENOUS

## 2022-10-04 MED ORDER — ALBUTEROL SULFATE (2.5 MG/3ML) 0.083% IN NEBU: 2.5000 mg | INHALATION_SOLUTION | RESPIRATORY_TRACT | Status: DC | PRN

## 2022-10-04 NOTE — Discharge Instructions (Addendum)
You will go by personal vehicle over to [facility] emergency department both the charge nurse and physician working, Dr. Suezanne Jacquet will evaluate you in the emergency department.  The surgeon Dr. Carolynne Edouard will see you when you arrive.

## 2022-10-04 NOTE — H&P (Signed)
Allison Cunningham is an 42 y.o. female.   Chief Complaint: Abdominal pain HPI: The patient is a 42 year old white female who presents with right upper quadrant pain that started about 2 weeks ago.  The pain has gradually gotten more severe over the last 2 weeks.  The pain has been associated with significant nausea and vomiting.  The pain seems to radiate into her back.  She finally came to the emergency department where an ultrasound showed some thickening of the gallbladder wall and a gallbladder full of small stones.  Her liver functions were normal.  Her white count was 24,000.  She does smoke and has a history of schizophrenia  Past Medical History:  Diagnosis Date   Anxiety    Dizziness    Palpitations    Pre-syncope    Syncope and collapse    has no happened in last year--last 2013    Past Surgical History:  Procedure Laterality Date   CESAREAN SECTION     x2    Family History  Problem Relation Age of Onset   Heart attack Mother    Hypertension Mother    Heart murmur Mother    Heart disease Father    Cirrhosis Father    Hypertension Father    Heart attack Maternal Grandmother    Cervical cancer Maternal Grandmother    Stroke Maternal Grandmother    Hypertension Maternal Grandmother    Hypertension Maternal Uncle    Stroke Maternal Grandfather    Social History:  reports that she has been smoking cigarettes. She has a 17.00 pack-year smoking history. She has never used smokeless tobacco. She reports that she does not drink alcohol and does not use drugs.  Allergies:  Allergies  Allergen Reactions   Lamotrigine Rash   Cephalexin Nausea And Vomiting   Clarithromycin Hives   Amoxicillin-Pot Clavulanate Rash   Penicillins Rash    (Not in a hospital admission)   Results for orders placed or performed during the hospital encounter of 10/04/22 (from the past 48 hour(s))  Lipase, blood     Status: None   Collection Time: 10/04/22  1:32 PM  Result Value Ref Range    Lipase 31 11 - 51 U/L    Comment: Performed at [facility], [address] [place], [place] [address]  Comprehensive metabolic panel     Status: Abnormal   Collection Time: 10/04/22  1:32 PM  Result Value Ref Range   Sodium 137 135 - 145 mmol/L   Potassium 3.8 3.5 - 5.1 mmol/L   Chloride 107 98 - 111 mmol/L   CO2 19 (L) 22 - 32 mmol/L   Glucose, Bld 110 (H) 70 - 99 mg/dL    Comment: Glucose reference range applies only to samples taken after fasting for at least 8 hours.   BUN 21 (H) 6 - 20 mg/dL   Creatinine, Ser 2.44 0.44 - 1.00 mg/dL   Calcium 9.2 8.9 - 01.0 mg/dL   Total Protein 7.4 6.5 - 8.1 g/dL   Albumin 3.9 3.5 - 5.0 g/dL   AST 20 15 - 41 U/L   ALT 16 0 - 44 U/L   Alkaline Phosphatase 28 (L) 38 - 126 U/L   Total Bilirubin 0.4 0.3 - 1.2 mg/dL   GFR, Estimated >27 >25 mL/min    Comment: (NOTE) Calculated using the CKD-EPI Creatinine Equation (2021)    Anion gap 11 5 - 15    Comment: Performed at [facility], [address] [place], [place] [address]  CBC     Status: Abnormal   Collection Time: 10/04/22  1:32 PM  Result Value Ref Range   WBC 23.8 (H) 4.0 - 10.5 K/uL   RBC 4.62 3.87 - 5.11 MIL/uL   Hemoglobin 13.3 12.0 - 15.0 g/dL   HCT 52.8 41.3 - 24.4 %   MCV 86.6 80.0 - 100.0 fL   MCH 28.8 26.0 - 34.0 pg   MCHC 33.3 30.0 - 36.0 g/dL   RDW 01.0 27.2 - 53.6 %   Platelets 384 150 - 400 K/uL   nRBC 0.0 0.0 - 0.2 %    Comment: Performed at [facility], [address] [place], [place] [address]  Urinalysis, Routine w reflex microscopic -Urine, Clean Catch     Status: Abnormal   Collection Time: 10/04/22  1:32 PM  Result Value Ref Range   Color, Urine YELLOW YELLOW   APPearance CLOUDY (A) CLEAR   Specific Gravity, Urine >=1.030 1.005 - 1.030   pH 5.0 5.0 - 8.0   Glucose, UA NEGATIVE NEGATIVE mg/dL   Hgb urine dipstick NEGATIVE NEGATIVE   Bilirubin Urine SMALL (A) NEGATIVE   Ketones, ur NEGATIVE NEGATIVE mg/dL   Protein, ur  NEGATIVE NEGATIVE mg/dL   Nitrite NEGATIVE NEGATIVE   Leukocytes,Ua NEGATIVE NEGATIVE    Comment: Microscopic not done on urines with negative protein, blood, leukocytes, nitrite, or glucose < 500 mg/dL. Performed at [facility], [address] [place], [place] [address]   Pregnancy, urine     Status: None   Collection Time: 10/04/22  1:32 PM  Result Value Ref Range   Preg Test, Ur NEGATIVE NEGATIVE    Comment:        THE SENSITIVITY OF THIS METHODOLOGY IS >25 mIU/mL. Performed at [facility], [address] [place], [place] [address]   CBC     Status: Abnormal   Collection Time: 10/04/22  1:32 PM  Result Value Ref Range   WBC 24.0 (H) 4.0 - 10.5 K/uL   RBC 4.57 3.87 - 5.11 MIL/uL   Hemoglobin 13.3 12.0 - 15.0 g/dL   HCT 75.6 43.3 - 29.5 %   MCV 86.9 80.0 - 100.0 fL   MCH 29.1 26.0 - 34.0 pg   MCHC 33.5 30.0 - 36.0 g/dL   RDW 18.8 41.6 - 60.6 %   Platelets 389 150 - 400 K/uL   nRBC 0.0 0.0 - 0.2 %    Comment: Performed at [facility], [address] [place], [place] [address]  Differential     Status: Abnormal   Collection Time: 10/04/22  1:32 PM  Result Value Ref Range   Neutrophils Relative % 80 %   Neutro Abs 19.1 (H) 1.7 - 7.7 K/uL   Lymphocytes Relative 10 %   Lymphs Abs 2.5 0.7 - 4.0 K/uL   Monocytes Relative 6 %   Monocytes Absolute 1.4 (H) 0.1 - 1.0 K/uL   Eosinophils Relative 3 %   Eosinophils Absolute 0.8 (H) 0.0 - 0.5 K/uL   Basophils Relative 1 %   Basophils Absolute 0.1 0.0 - 0.1 K/uL   Immature Granulocytes 0 %   Abs Immature Granulocytes 0.09 (H) 0.00 - 0.07 K/uL    Comment: Performed at [facility], [address] [place], [place] [address]   US Abdomen Limited RUQ (LIVER/GB)  Result Date: 10/04/2022 CLINICAL DATA:  Diffuse right upper quadrant  pain, nausea and vomiting for 2 weeks, elevated white blood cell count EXAM: ULTRASOUND ABDOMEN LIMITED RIGHT UPPER QUADRANT COMPARISON:  08/20/2021 FINDINGS:  Gallbladder: Wall-echo-shadow complex within the gallbladder consistent with multiple shadowing gallstones filling the gallbladder lumen. Borderline gallbladder wall thickening measuring 3 mm. Trace pericholecystic fluid. There is a positive sonographic Murphy sign. Common bile duct: Diameter: 2 mm Liver: No focal lesion identified. Within normal limits in parenchymal echogenicity. Portal vein is patent on color Doppler imaging with normal direction of blood flow towards the liver. Other: None. IMPRESSION: 1. Multiple shadowing gallstones filling the gallbladder lumen, with borderline gallbladder wall thickening, trace pericholecystic fluid, and positive sonographic Murphy sign consistent with acute cholecystitis. Electronically Signed   By: Sharlet Salina M.D.   On: 10/04/2022 15:10    Review of Systems  Constitutional:  Positive for chills.  HENT: Negative.    Eyes: Negative.   Respiratory: Negative.    Cardiovascular: Negative.   Gastrointestinal:  Positive for abdominal pain, nausea and vomiting.  Endocrine: Negative.   Genitourinary: Negative.   Musculoskeletal:  Positive for back pain.  Skin: Negative.   Allergic/Immunologic: Negative.   Neurological: Negative.   Hematological: Negative.   Psychiatric/Behavioral: Negative.      Blood pressure 108/76, pulse 76, temperature 98 F (36.7 C), temperature source Oral, resp. rate 18, height 5\' 4"  (1.626 m), weight 77.1 kg, last menstrual period 10/04/2022, SpO2 100 %. Physical Exam Vitals reviewed.  Constitutional:      Appearance: Normal appearance.     Comments: Appears uncomfortable  HENT:     Head: Normocephalic and atraumatic.     Right Ear: External ear normal.     Left Ear: External ear normal.     Nose: Nose normal.     Mouth/Throat:     Mouth: Mucous membranes are dry.     Pharynx: Oropharynx is clear.  Eyes:     General: No scleral icterus.    Extraocular Movements: Extraocular movements intact.     Conjunctiva/sclera:  Conjunctivae normal.     Pupils: Pupils are equal, round, and reactive to light.  Cardiovascular:     Rate and Rhythm: Normal rate and regular rhythm.     Pulses: Normal pulses.     Heart sounds: Normal heart sounds.  Pulmonary:     Effort: Pulmonary effort is normal. No respiratory distress.     Breath sounds: Normal breath sounds.  Abdominal:     General: Abdomen is flat.     Palpations: Abdomen is soft.     Tenderness: There is abdominal tenderness.     Comments: There is moderate to severe right upper quadrant pain.  I cannot palpate the gallbladder at this time  Musculoskeletal:        General: No swelling or deformity. Normal range of motion.     Cervical back: Normal range of motion and neck supple.  Skin:    General: Skin is warm and dry.     Coloration: Skin is not jaundiced.  Neurological:     General: No focal deficit present.     Mental Status: She is alert and oriented to person, place, and time.  Psychiatric:        Mood and Affect: Mood normal.        Behavior: Behavior normal.      Assessment/Plan The patient appears to have symptomatic cholecystitis with cholelithiasis.  Because of the risk of further painful episodes and possible pancreatitis she may benefit from having her gallbladder removed during  this hospitalization.  At this point we will plan to admit her and start her on broad-spectrum antibiotic therapy.  We will get her hydrated through the IV and we will discuss her situation with the primary team in the morning.  Chevis Pretty III, MD 10/04/2022, 6:32 PM

## 2022-10-04 NOTE — ED Notes (Signed)
ED TO INPATIENT HANDOFF REPORT  ED Nurse Name and Phone #: Thamas Jaegers Name/Age/Gender Allison Cunningham 42 y.o. female Room/Bed: WA09/WA09  Code Status   Code Status: Full Code  Home/SNF/Other Home Patient oriented to: self, place, time, and situation Is this baseline? Yes   Triage Complete: Triage complete  Chief Complaint Cholecystitis with cholelithiasis [K80.10]  Triage Note Patient having Vomiting and ABD for two weeks. She denied fever.   Allergies Allergies  Allergen Reactions   Lamotrigine Rash   Cephalexin Nausea And Vomiting   Clarithromycin Hives   Amoxicillin-Pot Clavulanate Rash   Penicillins Rash    Level of Care/Admitting Diagnosis ED Disposition     ED Disposition  Admit   Condition  --   Comment  Hospital Area: [facility] [[id number]]  Level of Care: Med-Surg [16]  May admit patient to [facility] or [facility] if equivalent level of care is available:: No  Covid Evaluation: Asymptomatic - no recent exposure (last 10 days) testing not required  Diagnosis: Cholecystitis with cholelithiasis [132440]  Admitting Physician: CCS, MD [[id number]]  Attending Physician: CCS, MD [[id number]]  Certification:: I certify this patient will need inpatient services for at least 2 midnights  Estimated Length of Stay: 3          B Medical/Surgery History Past Medical History:  Diagnosis Date   Anxiety    Dizziness    Palpitations    Pre-syncope    Syncope and collapse    has no happened in last year--last 2013   Past Surgical History:  Procedure Laterality Date   CESAREAN SECTION     x2     A IV Location/Drains/Wounds Patient Lines/Drains/Airways Status     Active Line/Drains/Airways     Name Placement date Placement time Site Days   Peripheral IV 10/04/22 20 G Left Antecubital 10/04/22  1332  Antecubital  less than 1            Intake/Output Last 24 hours  Intake/Output Summary (Last 24 hours) at 10/04/2022 1917 Last data  filed at 10/04/2022 1815 Gross per 24 hour  Intake 1195.71 ml  Output --  Net 1195.71 ml    Labs/Imaging Results for orders placed or performed during the hospital encounter of 10/04/22 (from the past 48 hour(s))  Lipase, blood     Status: None   Collection Time: 10/04/22  1:32 PM  Result Value Ref Range   Lipase 31 11 - 51 U/L    Comment: Performed at [facility], [address] [place], [place] [address]  Comprehensive metabolic panel     Status: Abnormal   Collection Time: 10/04/22  1:32 PM  Result Value Ref Range   Sodium 137 135 - 145 mmol/L   Potassium 3.8 3.5 - 5.1 mmol/L   Chloride 107 98 - 111 mmol/L   CO2 19 (L) 22 - 32 mmol/L   Glucose, Bld 110 (H) 70 - 99 mg/dL    Comment: Glucose reference range applies only to samples taken after fasting for at least 8 hours.   BUN 21 (H) 6 - 20 mg/dL   Creatinine, Ser 5.36 0.44 - 1.00 mg/dL   Calcium 9.2 8.9 - 64.4 mg/dL   Total Protein 7.4 6.5 - 8.1 g/dL   Albumin 3.9 3.5 - 5.0 g/dL   AST 20 15 - 41 U/L   ALT 16 0 - 44 U/L   Alkaline Phosphatase 28 (L) 38 - 126 U/L   Total Bilirubin  0.4 0.3 - 1.2 mg/dL   GFR, Estimated >16 >10 mL/min    Comment: (NOTE) Calculated using the CKD-EPI Creatinine Equation (2021)    Anion gap 11 5 - 15    Comment: Performed at [facility], [address] [place], [place] [address]  CBC     Status: Abnormal   Collection Time: 10/04/22  1:32 PM  Result Value Ref Range   WBC 23.8 (H) 4.0 - 10.5 K/uL   RBC 4.62 3.87 - 5.11 MIL/uL   Hemoglobin 13.3 12.0 - 15.0 g/dL   HCT 40.9 81.1 - 91.4 %   MCV 86.6 80.0 - 100.0 fL   MCH 28.8 26.0 - 34.0 pg   MCHC 33.3 30.0 - 36.0 g/dL   RDW 78.2 95.6 - 21.3 %   Platelets 384 150 - 400 K/uL   nRBC 0.0 0.0 - 0.2 %    Comment: Performed at [facility], [address] [place], [place] [address]  Urinalysis, Routine w reflex microscopic -Urine, Clean Catch     Status: Abnormal   Collection Time: 10/04/22  1:32 PM  Result  Value Ref Range   Color, Urine YELLOW YELLOW   APPearance CLOUDY (A) CLEAR   Specific Gravity, Urine >=1.030 1.005 - 1.030   pH 5.0 5.0 - 8.0   Glucose, UA NEGATIVE NEGATIVE mg/dL   Hgb urine dipstick NEGATIVE NEGATIVE   Bilirubin Urine SMALL (A) NEGATIVE   Ketones, ur NEGATIVE NEGATIVE mg/dL   Protein, ur NEGATIVE NEGATIVE mg/dL   Nitrite NEGATIVE NEGATIVE   Leukocytes,Ua NEGATIVE NEGATIVE    Comment: Microscopic not done on urines with negative protein, blood, leukocytes, nitrite, or glucose < 500 mg/dL. Performed at [facility], [address] [place], [place] [address]   Pregnancy, urine     Status: None   Collection Time: 10/04/22  1:32 PM  Result Value Ref Range   Preg Test, Ur NEGATIVE NEGATIVE    Comment:        THE SENSITIVITY OF THIS METHODOLOGY IS >25 mIU/mL. Performed at [facility], [address] [place], [place] [address]   CBC     Status: Abnormal   Collection Time: 10/04/22  1:32 PM  Result Value Ref Range   WBC 24.0 (H) 4.0 - 10.5 K/uL   RBC 4.57 3.87 - 5.11 MIL/uL   Hemoglobin 13.3 12.0 - 15.0 g/dL   HCT 41.3 24.4 - 01.0 %   MCV 86.9 80.0 - 100.0 fL   MCH 29.1 26.0 - 34.0 pg   MCHC 33.5 30.0 - 36.0 g/dL   RDW 27.2 53.6 - 64.4 %   Platelets 389 150 - 400 K/uL   nRBC 0.0 0.0 - 0.2 %    Comment: Performed at [facility], [address] [place], [place] [address]  Differential     Status: Abnormal   Collection Time: 10/04/22  1:32 PM  Result Value Ref Range   Neutrophils Relative % 80 %   Neutro Abs 19.1 (H) 1.7 - 7.7 K/uL   Lymphocytes Relative 10 %   Lymphs Abs 2.5 0.7 - 4.0 K/uL   Monocytes Relative 6 %   Monocytes Absolute 1.4 (H) 0.1 - 1.0 K/uL   Eosinophils Relative 3 %   Eosinophils Absolute 0.8 (H) 0.0 - 0.5 K/uL   Basophils Relative 1 %   Basophils Absolute 0.1 0.0 - 0.1 K/uL   Immature Granulocytes 0 %   Abs  Immature Granulocytes 0.09 (H) 0.00 - 0.07 K/uL    Comment: Performed at [facility], [address] [place], [place] [address]   US Abdomen Limited RUQ (LIVER/GB)  Result Date: 10/04/2022 CLINICAL DATA:  Diffuse right upper quadrant pain, nausea and vomiting for 2 weeks, elevated white blood cell count EXAM: ULTRASOUND ABDOMEN LIMITED RIGHT UPPER QUADRANT COMPARISON:  08/20/2021 FINDINGS: Gallbladder: Wall-echo-shadow complex within the gallbladder consistent with multiple shadowing gallstones filling the gallbladder lumen. Borderline gallbladder wall thickening measuring 3 mm. Trace pericholecystic fluid. There is a positive sonographic Murphy sign. Common bile duct: Diameter: 2 mm Liver: No focal lesion identified. Within normal limits in parenchymal echogenicity. Portal vein is patent on color Doppler imaging with normal direction of blood flow towards the liver. Other: None. IMPRESSION: 1. Multiple shadowing gallstones filling the gallbladder lumen, with borderline gallbladder wall thickening, trace pericholecystic fluid, and positive sonographic Murphy sign consistent with acute cholecystitis. Electronically Signed   By: Sharlet Salina M.D.   On: 10/04/2022 15:10    Pending Labs [employer] (From admission, onward)     Start     Ordered   Signed and Held  HIV Antibody (routine testing w rflx)  (HIV Antibody (Routine testing w reflex) panel)  Once,   R        Signed and Held   Signed and Held  Comprehensive metabolic panel  Tomorrow morning,   R        Signed and Held   Signed and Held  CBC  Tomorrow morning,   R        Signed and Held            Vitals/Pain Today's Vitals   10/04/22 1730 10/04/22 1745 10/04/22 1800 10/04/22 1816  BP: 110/80 109/77 108/76   Pulse: 82 77 76   Resp: 18 16 18    Temp:    98 F (36.7 C)  TempSrc:    Oral  SpO2: 97% 99% 100%   Weight:      Height:      PainSc:  6       Isolation Precautions No active isolations  Medications Medications  morphine (PF) 4 MG/ML injection 4 mg (4 mg Intravenous Given 10/04/22 1531)   ondansetron (ZOFRAN) injection 4 mg (4 mg Intravenous Given 10/04/22 1530)  sodium chloride 0.9 % bolus 1,000 mL (0 mLs Intravenous Stopped 10/04/22 1706)  ciprofloxacin (CIPRO) IVPB 400 mg (0 mg Intravenous Stopped 10/04/22 1815)  morphine (PF) 4 MG/ML injection 4 mg (4 mg Intravenous Given 10/04/22 1710)  ondansetron (ZOFRAN) injection 4 mg (4 mg Intravenous Given 10/04/22 1710)    Mobility walks     Focused Assessments     R Recommendations: See Admitting Provider Note  Report given to:   Additional Notes:

## 2022-10-04 NOTE — ED Provider Notes (Signed)
[place] [facility] Provider Note   CSN: [id number] Arrival date & time: 10/04/22  1307     History  Chief Complaint  Patient presents with   Abdominal Pain    Allison Cunningham is a 42 y.o. female with past medical history significant for obesity, anxiety, schizoaffective disorder, migraines who presents with concern for nausea, vomiting, abdominal pain for 2 weeks.  Patient reports that she did have a urinary tract infection at the end of May, received antibiotics, has still had some abnormal coloration of urine.  She denies any dysuria, urinary frequency, vaginal discharge, vaginal bleeding.  She reports decreased appetite, vomiting at least daily.  She has been trying to get into see her doctor but has not been able to.  She denies any fever but does endorse chills.  She denies any diarrhea, constipation.  Remote history of C-section but no other intra-abdominal infections noted.  Rates her pain 5/10, worse with palpation.   Abdominal Pain      Home Medications Prior to Admission medications   Medication Sig Start Date End Date Taking? Authorizing Provider  albuterol (VENTOLIN HFA) 108 (90 Base) MCG/ACT inhaler Inhale 2 puffs into the lungs every 4 (four) hours as needed. 12/18/20     apixaban (ELIQUIS) 5 MG TABS tablet TAKE 1 TABLET BY MOUTH TWICE DAILY 06/17/20 06/17/21  Wilfred Curtis, MD  Armodafinil 150 MG tablet Take 1 tablet (150 mg total) by mouth daily. Max Daily Amount: 150 mg. 04/21/22     Armodafinil 50 MG tablet Take two tablets (100 mg dose) by mouth daily. 09/27/20     atomoxetine (STRATTERA) 40 MG capsule Take 1 capsule (40 mg total) by mouth daily with food. 04/07/22     atorvastatin (LIPITOR) 10 MG tablet Take one tablet (10 mg dose) by mouth at bedtime. 10/07/21     atorvastatin (LIPITOR) 10 MG tablet Take 1 tablet (10 mg total) by mouth at bedtime. 01/12/22     busPIRone (BUSPAR) 10 MG tablet Take 10 mg by mouth 2 (two) times daily. 04/15/18    [provider]  busPIRone (BUSPAR) 10 MG tablet Take 1 tablet by mouth 3 times a day 11/08/20     busPIRone (BUSPAR) 10 MG tablet Take 1 tablet (10 mg total) by mouth 3 (three) times daily. 02/14/21     busPIRone (BUSPAR) 10 MG tablet Take 1 tablet by mouth 3 times a day 05/21/21     busPIRone (BUSPAR) 10 MG tablet Take 1 tablet by mouth 3 times a day 05/21/21     busPIRone (BUSPAR) 10 MG tablet Take 1 tablet (10 mg total) by mouth in the morning, at noon, and at bedtime. 04/07/22     carvedilol (COREG) 3.125 MG tablet Take 3.125 mg by mouth 2 (two) times daily. 12/25/19   [provider]  carvedilol (COREG) 3.125 MG tablet Take one tablet (3.125 mg dose) by mouth 2 (two) times daily. 10/07/21     carvedilol (COREG) 3.125 MG tablet Take 1 tablet (3.125 mg total) by mouth 2 (two) times daily. 01/12/22     cephALEXin (KEFLEX) 500 MG capsule Take 1 capsule (500 mg total) by mouth 2 (two) times daily. 01/23/22   Margaretann Loveless, PA-C  citalopram (CELEXA) 10 MG tablet Take 10 mg by mouth daily. 05/16/18   [provider]  citalopram (CELEXA) 20 MG tablet Take 1 tablet by mouth at bedtime 01/14/21     citalopram (CELEXA) 20 MG tablet Take  1 tablet (20 mg total) by mouth at bedtime. 07/15/21     clindamycin (CLEOCIN) 300 MG capsule Take 1 capsule (300 mg total) by mouth 4 (four) times daily until finished. 05/21/22     clonazePAM (KLONOPIN) 0.5 MG tablet Take 0.5 mg by mouth 2 (two) times daily.    [provider]  clonazePAM (KLONOPIN) 0.5 MG tablet Take 1/2 tablet (0.25 mg total) by mouth daily. 02/14/21     clonazePAM (KLONOPIN) 0.5 MG tablet Take 1 tablet by mouth daily 07/15/21     clonazePAM (KLONOPIN) 0.5 MG tablet Take 1 tablet (0.5 mg total) by mouth daily. 04/07/22     diclofenac (VOLTAREN) 75 MG EC tablet Take 75 mg by mouth 2 (two) times daily. 10/09/19   [provider]  diclofenac (VOLTAREN) 75 MG EC tablet Take 1 tablet (75 mg total) by mouth 2 (two) times  daily. 04/18/20   Lenn Sink, DPM  dicyclomine (BENTYL) 20 MG tablet Take one tablet (20 mg dose) by mouth 2 (two) times daily. 07/31/20     doxycycline (VIBRA-TABS) 100 MG tablet Take 1 tablet (100 mg total) by mouth 2 (two) times daily. 08/23/22   Hawks, Edilia Bo, FNP  Erenumab-aooe (AIMOVIG) 140 MG/ML SOAJ Inject 140 mg into the skin every 30 (thirty) days. 05/21/21     Erenumab-aooe (AIMOVIG) 140 MG/ML SOAJ Inject 140 mg into the skin every 30 (thirty) days. 06/16/21     Erenumab-aooe (AIMOVIG) 140 MG/ML SOAJ Inject 140 mg into the skin every 30 (thirty) days. 10/17/21     Erenumab-aooe (AIMOVIG) 140 MG/ML SOAJ Inject 140 mg into the skin every 30 (thirty) days. 04/29/22     Erenumab-aooe (AIMOVIG) 140 MG/ML SOAJ Inject 140 mg as directed every 30 (thirty) days. 07/06/22     fenofibrate 160 MG tablet TAKE ONE TABLET (160 MG DOSE) BY MOUTH DAILY. 05/20/20 05/20/21  Wilfred Curtis, MD  fenofibrate 160 MG tablet Take 1 tablet (160 mg total) by mouth daily. 02/18/22     fenofibrate 160 MG tablet Take 1 tablet (160 mg total) by mouth daily. 06/05/22     fluticasone (FLONASE) 50 MCG/ACT nasal spray Place 2 sprays into both nostrils daily. 11/26/21     gabapentin (NEURONTIN) 100 MG capsule Take one capsule (100 mg dose) by mouth at bedtime for 1 week and then increase to 2 capsules at bedtime for 1 week and then increase to 3 capsules at bedtime to continue 05/20/21     gabapentin (NEURONTIN) 300 MG capsule Take one capsule (300 mg dose) by mouth at bedtime. 09/30/21     gabapentin (NEURONTIN) 300 MG capsule Take one capsule (300 mg dose) by mouth at bedtime. 01/12/22     HYDROcodone-acetaminophen (NORCO/VICODIN) 5-325 MG tablet Take one tablet by mouth every 4 (four) hours as needed for Pain for up to 3 days. 07/17/20     ibuprofen (ADVIL) 800 MG tablet Take 1 tablet (800 mg total) by mouth every 8 (eight) hours with food as needed for pain 11/21/21   Margaretann Loveless, PA-C  ipratropium (ATROVENT) 0.03 % nasal spray  Place 2 sprays into both nostrils 3 (three) times daily as needed. 11/26/21     ketorolac (TORADOL) 10 MG tablet Take one tablet (10 mg dose) by mouth every 6 (six) hours as needed. 07/17/20     metFORMIN (GLUCOPHAGE-XR) 750 MG 24 hr tablet Take 1 tablet (750 mg total) by mouth daily. 03/20/22     methocarbamol (ROBAXIN) 500 MG tablet Take  1 tablet (500 mg total) by mouth 2 (two) times daily. 03/24/22   Karie Mainland, Amjad, PA-C  modafinil (PROVIGIL) 200 MG tablet TAKE 1 & 1/2 TABLETS BY MOUTH EVERY MORNING **NEEDS OFFICE VISIT FOR FURTHER REFILLS** 07/18/20     nitrofurantoin, macrocrystal-monohydrate, (MACROBID) 100 MG capsule Take 1 capsule (100 mg total) by mouth 2 (two) times daily for 5 days. 07/02/22     Norgestimate-Ethinyl Estradiol Triphasic 0.18/0.215/0.25 MG-25 MCG tab TAKE 1 TABLET BY MOUTH ONCE DAILY 01/16/20 01/15/21  Kang-Oh, Leah E, DO  omeprazole (PRILOSEC) 40 MG capsule TAKE 1 CAPSULE (40 MG TOTAL) BY MOUTH DAILY. 01/15/20 01/14/21  Jannifer Rodney A, FNP  omeprazole (PRILOSEC) 40 MG capsule Take 1 capsule by mouth daily. 02/18/22     ondansetron (ZOFRAN-ODT) 4 MG disintegrating tablet Take 1 tablet (4 mg total) by mouth every 8 (eight) hours as needed for nausea or vomiting. 11/07/21   Elson Areas, PA-C  oxcarbazepine (TRILEPTAL) 600 MG tablet Take 1 tablet by mouth daily every night at bedtime 08/05/20     oxcarbazepine (TRILEPTAL) 600 MG tablet Take 1 tablet by mouth every night at bedtime 08/05/20     oxcarbazepine (TRILEPTAL) 600 MG tablet Take 1 tablet by mouth once every night at bedtime 02/11/21     oxcarbazepine (TRILEPTAL) 600 MG tablet Take 1 tablet by mouth daily at bedtime 12/12/21     oxcarbazepine (TRILEPTAL) 600 MG tablet Take 1 tablet (600 mg total) by mouth at bedtime. 03/20/22     oxyCODONE-acetaminophen (PERCOCET/ROXICET) 5-325 MG tablet Take 1 tablet by mouth every  4 to 6 hours as needed for pain 05/15/22     paliperidone (INVEGA SUSTENNA) 156 MG/ML SUSY injection Inject 1ml once  monthly as directed 08/05/20     paliperidone (INVEGA) 3 MG 24 hr tablet Take by mouth. 03/29/17   [provider]  paliperidone (INVEGA) 3 MG 24 hr tablet Take 1 tablet (3 mg total) by mouth at bedtime. 03/20/22     prazosin (MINIPRESS) 5 MG capsule Take 1 tablet by mouth at bedtime 06/19/21     prazosin (MINIPRESS) 5 MG capsule Take 1 capsule (5 mg total) by mouth at bedtime. 03/04/22     prochlorperazine (COMPAZINE) 10 MG tablet Take one tablet (10 mg dose) by mouth every 8 (eight) hours as needed. 09/30/21     prochlorperazine (COMPAZINE) 10 MG tablet Take one tablet (10 mg dose) by mouth every 8 (eight) hours as needed. 10/06/21     promethazine (PHENERGAN) 25 MG tablet Take 1 tablet by mouth every 6 to 8 hours as needed for nausea or vomiting 05/15/22     sulfamethoxazole-trimethoprim (BACTRIM DS) 800-160 MG tablet Take 1 tablet by mouth 2 (two) times daily until gone. 05/21/22     SUMAtriptan (IMITREX) 100 MG tablet Take 1 tablet by mouth at onset of headache. May repeat in 2 hours if headache pain persists. **Max of 2 tablets in 24 hours** 07/29/20     SUMAtriptan (IMITREX) 100 MG tablet One tablet at the onset of headache.  May repeat in two hours if the headache pain persists.  No more than 2 tablets in a 24 hour period. 05/21/21     topiramate (TOPAMAX) 100 MG tablet TAKE 1 TABLET BY MOUTH EVERY NIGHT AT BEDTIME 11/02/19 11/01/20  Iran Ouch, FNP  topiramate (TOPAMAX) 100 MG tablet Take 1 tablet (100 mg total) by mouth at bedtime. 10/30/20     topiramate (TOPAMAX) 25 MG tablet Take one tablet at bedtime  for one week, then two tablets at bedtime for one week, then three tablets at bedtime 07/07/19   [provider]  topiramate (TOPAMAX) 50 MG tablet Take three tablets (150 mg dose) by mouth at bedtime. 09/30/21     topiramate (TOPAMAX) 50 MG tablet Take 3 tablets (150 mg total) by mouth at bedtime. 12/30/21     topiramate (TOPAMAX) 50 MG tablet Take 3 tablets (150 mg total) by mouth at  bedtime. 04/29/22     TRI-LO-MARZIA 0.18/0.215/0.25 MG-25 MCG tab Take 1 tablet by mouth daily. 01/17/18   [provider]  triamcinolone cream (KENALOG) 0.1 % Apply on to the skin 2 times daily as needed 02/09/22   Margaretann Loveless, PA-C  valbenazine [facility]) 80 MG capsule Take one capsule by mouth daily 04/25/21     valbenazine (INGREZZA) 80 MG capsule Take 1 capsule (80 mg total) by mouth daily. 04/07/22     Valbenazine Tosylate 80 MG CAPS Take by mouth. 01/07/17   [provider]  viloxazine ER (QELBREE) 200 MG 24 hr capsule Take 2 capsules (400 mg total) by mouth daily. 04/25/21     viloxazine ER (QELBREE) 200 MG 24 hr capsule Take 2 capsules (400 mg total) by mouth daily. 10/31/21     viloxazine ER (QELBREE) 200 MG 24 hr capsule Take 2 capsules (400 mg total) by mouth daily. 10/31/21     viloxazine ER (QELBREE) 200 MG 24 hr capsule Take 2 capsules (400 mg total) by mouth daily. 03/04/22         Allergies    Lamotrigine, Cephalexin, Clarithromycin, Amoxicillin-pot clavulanate, and Penicillins    Review of Systems   Review of Systems  Gastrointestinal:  Positive for abdominal pain.  All other systems reviewed and are negative.   Physical Exam Updated Vital Signs BP 113/72 (BP Location: Right Arm)   Pulse (!) 103   Temp 98.4 F (36.9 C) (Oral)   Resp 16   Ht 5\' 4"  (1.626 m)   Wt 77.1 kg   LMP 10/04/2022 (Exact Date)   SpO2 96%   BMI 29.18 kg/m  Physical Exam Vitals and nursing note reviewed.  Constitutional:      General: She is not in acute distress.    Appearance: Normal appearance.  HENT:     Head: Normocephalic and atraumatic.  Eyes:     General:        Right eye: No discharge.        Left eye: No discharge.  Cardiovascular:     Rate and Rhythm: Normal rate and regular rhythm.     Heart sounds: No murmur heard.    No friction rub. No gallop.  Pulmonary:     Effort: Pulmonary effort is normal.     Breath sounds: Normal breath sounds.   Abdominal:     General: Bowel sounds are normal.     Palpations: Abdomen is soft.     Comments: Focally tender in the right upper quadrant but diffusely tender throughout the entire abdomen, no rebound, rigidity, guarding, no distention noted.  No bruising or swelling noted.  Skin:    General: Skin is warm and dry.     Capillary Refill: Capillary refill takes less than 2 seconds.  Neurological:     Mental Status: She is alert and oriented to person, place, and time.  Psychiatric:        Mood and Affect: Mood normal.        Behavior: Behavior normal.  ED Results / Procedures / Treatments   Labs (all labs ordered are listed, but only abnormal results are displayed) Labs Reviewed  COMPREHENSIVE METABOLIC PANEL - Abnormal; Notable for the following components:      Result Value   CO2 19 (*)    Glucose, Bld 110 (*)    BUN 21 (*)    Alkaline Phosphatase 28 (*)    All other components within normal limits  CBC - Abnormal; Notable for the following components:   WBC 23.8 (*)    All other components within normal limits  URINALYSIS, ROUTINE W REFLEX MICROSCOPIC - Abnormal; Notable for the following components:   APPearance CLOUDY (*)    Bilirubin Urine SMALL (*)    All other components within normal limits  CBC - Abnormal; Notable for the following components:   WBC 24.0 (*)    All other components within normal limits  DIFFERENTIAL - Abnormal; Notable for the following components:   Neutro Abs 19.1 (*)    Monocytes Absolute 1.4 (*)    Eosinophils Absolute 0.8 (*)    Abs Immature Granulocytes 0.09 (*)    All other components within normal limits  LIPASE, BLOOD  PREGNANCY, URINE    EKG None  Radiology US Abdomen Limited RUQ (LIVER/GB)  Result Date: 10/04/2022 CLINICAL DATA:  Diffuse right upper quadrant pain, nausea and vomiting for 2 weeks, elevated white blood cell count EXAM: ULTRASOUND ABDOMEN LIMITED RIGHT UPPER QUADRANT COMPARISON:  08/20/2021 FINDINGS:  Gallbladder: Wall-echo-shadow complex within the gallbladder consistent with multiple shadowing gallstones filling the gallbladder lumen. Borderline gallbladder wall thickening measuring 3 mm. Trace pericholecystic fluid. There is a positive sonographic Murphy sign. Common bile duct: Diameter: 2 mm Liver: No focal lesion identified. Within normal limits in parenchymal echogenicity. Portal vein is patent on color Doppler imaging with normal direction of blood flow towards the liver. Other: None. IMPRESSION: 1. Multiple shadowing gallstones filling the gallbladder lumen, with borderline gallbladder wall thickening, trace pericholecystic fluid, and positive sonographic Murphy sign consistent with acute cholecystitis. Electronically Signed   By: Sharlet Salina M.D.   On: 10/04/2022 15:10    Procedures Procedures    Medications Ordered in ED Medications  ciprofloxacin (CIPRO) IVPB 400 mg (has no administration in time range)  morphine (PF) 4 MG/ML injection 4 mg (4 mg Intravenous Given 10/04/22 1531)  ondansetron (ZOFRAN) injection 4 mg (4 mg Intravenous Given 10/04/22 1530)  sodium chloride 0.9 % bolus 1,000 mL (1,000 mLs Intravenous New Bag/Given 10/04/22 1534)    ED Course/ Medical Decision Making/ A&P Clinical Course as of 10/04/22 1626  Sun Oct 04, 2022  1415 Started 2 weeks ago -- had UTI at end of May, never felt quite back to normal self. Pain in left flank now radiating to right side. Taking advil, tylenol at home without much relief. Urine greenish in color? PMHx of C section remotely.  [CP]  1525 Spoke with Dr. Denton Brick who accepts transfer, charge nurse at [facility] informed, patient will go POV with partner [CP]    Clinical Course User Index [CP] Olene Floss, PA-C                             Medical Decision Making Amount and/or Complexity of Data Reviewed Labs: ordered. Radiology: ordered.  Risk Prescription drug management.   This patient is a 42 y.o. female  who  presents to the ED for concern of abdominal pain, nausea, vomiting.  Differential diagnoses prior to evaluation: The emergent differential diagnosis includes, but is not limited to,  esophagitis, gastritis, peptic ulcer disease, esophageal rupture, gastric rupture, Boerhaave's, Mallory-Weiss, pancreatitis, cholecystitis, cholangitis, acute mesenteric ischemia, atypical chest pain or ACS, lower lobar pneumonia versus other . This is not an exhaustive differential.   Past Medical History / Co-morbidities / Social History: Remote C-section, otherwise overall noncontributory past medical history  Physical Exam: Physical exam performed. The pertinent findings include: Diffuse abdominal tenderness throughout, with mild tachycardia on arrival, pulse 103.  She is afebrile, stable oxygen saturation on room air.  She has some focal right upper quadrant tenderness with positive Murphy sign.  No rebound, rigidity, guarding.  Lab Tests/Imaging studies: I personally interpreted labs/imaging and the pertinent results include: UA overall unremarkable, there is a small amount of bilirubin but no nitrates, leukocytes or bacteria.  CMP overall unremarkable, mild bicarb deficit, CO2 19 but without any anion gap.  CBC is significantly elevated with white blood cell count of 24, no clinically significant anemia or platelet dysfunction.  Differential with predominance of neutrophils does suggest acute infectious or surgical process.  Normal lipase, pregnancy test negative..  I independently interpreted right upper quadrant ultrasound which shows gallbladder wall thickening, multiple shadowing gallstones and positive sonographic Murphy sign compatible with acute cholecystitis I agree with the radiologist interpretation.   Medications: I ordered medication including fluid bolus, morphine, Zofran for nausea, pain, Cipro for antibiosis for acute cholecystitis.  I have reviewed the patients home medicines and have made  adjustments as needed.   Consults: I spoke with surgeon, Dr. Carolynne Edouard who after reviewing patient's lab work, imaging would like patient to be transferred ED to ED to [facility] so that he can personally evaluate before agreeing to admit the patient to his service.  I spoke with ED physician Dr. Suezanne Jacquet who agrees to transfer.  Patient's IV line is secured and she was transported POV by her partner to [facility] emergency department.  Charge nurse Mickie Bail informed  Disposition: After consideration of the diagnostic results and the patients response to treatment, I feel that patient would benefit from hospital admission, surgery, for acute cholecystitis, she will go ED to ED for evaluation by the surgeon prior to admission.Marland Kitchen   emergency department workup does not suggest an emergent condition requiring admission or immediate intervention beyond what has been performed at this time. The plan is: as above. The patient is safe for discharge and has been instructed to return immediately for worsening symptoms, change in symptoms or any other concerns.  Final Clinical Impression(s) / ED Diagnoses Final diagnoses:  Acute cholecystitis    Rx / DC Orders ED Discharge Orders     None         [place] 10/04/22 1626    Alvira Monday, MD 10/05/22 1045

## 2022-10-04 NOTE — ED Notes (Signed)
ED Provider at bedside. Patient to transport POV to [facility] per provider, Charge RN aware.

## 2022-10-04 NOTE — ED Provider Notes (Signed)
  Physical Exam  BP 108/76   Pulse 76   Temp 98 F (36.7 C) (Oral)   Resp 18   Ht 5\' 4"  (1.626 m)   Wt 77.1 kg   LMP 10/04/2022 (Exact Date)   SpO2 100%   BMI 29.18 kg/m   Physical Exam Vitals and nursing note reviewed.  Constitutional:      Appearance: Normal appearance.  HENT:     Head: Normocephalic and atraumatic.     Mouth/Throat:     Mouth: Mucous membranes are moist.  Eyes:     Conjunctiva/sclera: Conjunctivae normal.  Cardiovascular:     Rate and Rhythm: Normal rate.  Pulmonary:     Effort: Pulmonary effort is normal. No respiratory distress.  Abdominal:     General: Abdomen is flat.     Tenderness: There is abdominal tenderness in the right upper quadrant. There is guarding.  Musculoskeletal:        General: No deformity.  Skin:    General: Skin is warm and dry.     Capillary Refill: Capillary refill takes less than 2 seconds.  Neurological:     General: No focal deficit present.     Mental Status: She is alert. Mental status is at baseline.  Psychiatric:        Mood and Affect: Mood normal.        Behavior: Behavior normal.     Procedures  Procedures  ED Course / MDM   Clinical Course as of 10/04/22 1837  Sun Oct 04, 2022  1415 Started 2 weeks ago -- had UTI at end of May, never felt quite back to normal self. Pain in left flank now radiating to right side. Taking advil, tylenol at home without much relief. Urine greenish in color? PMHx of C section remotely.  [CP]  1525 Spoke with Dr. Denton Brick who accepts transfer, charge nurse at [facility] informed, patient will go POV with partner [CP]  1654 Evaluated patient, she was a transfer from [facility], reports around 2 weeks of worsening right upper quadrant pain, vomiting.  She does have significant right upper quadrant tenderness to palpation.  Discussed with Dr. Marvene Staff will come and see the patient. [WS]  1836 Patient was admitted to the surgical service.  [WS]    Clinical Course User  Index [CP] Prosperi, Harrel Carina, PA-C [WS] Lonell Grandchild, MD   Medical Decision Making Amount and/or Complexity of Data Reviewed Labs: ordered. Radiology: ordered.  Risk Prescription drug management. Decision regarding hospitalization.     ICD-10-CM   1. Acute cholecystitis  K81.0             Lonell Grandchild, MD 10/04/22 [phone]

## 2022-10-04 NOTE — ED Triage Notes (Signed)
Patient having Vomiting and ABD for two weeks. She denied fever.

## 2022-10-05 ENCOUNTER — Encounter (HOSPITAL_COMMUNITY)
Admission: EM | Disposition: A | Discharge status: Home / Self Care | Payer: Self-pay | Source: Home / Self Care | Attending: Emergency Medicine

## 2022-10-05 ENCOUNTER — Inpatient Hospital Stay (HOSPITAL_COMMUNITY): Payer: BC Managed Care – PPO | Admitting: Certified Registered"

## 2022-10-05 ENCOUNTER — Encounter (HOSPITAL_COMMUNITY): Payer: Self-pay

## 2022-10-05 ENCOUNTER — Other Ambulatory Visit: Payer: Self-pay

## 2022-10-05 HISTORY — PX: CHOLECYSTECTOMY: SHX55

## 2022-10-05 LAB — COMPREHENSIVE METABOLIC PANEL
ALT: 21 U/L (ref 0–44)
AST: 30 U/L (ref 15–41)
Albumin: 3.2 g/dL — ABNORMAL LOW (ref 3.5–5.0)
Alkaline Phosphatase: 25 U/L — ABNORMAL LOW (ref 38–126)
Anion gap: 5 (ref 5–15)
BUN: 16 mg/dL (ref 6–20)
CO2: 20 mmol/L — ABNORMAL LOW (ref 22–32)
Calcium: 8.4 mg/dL — ABNORMAL LOW (ref 8.9–10.3)
Chloride: 113 mmol/L — ABNORMAL HIGH (ref 98–111)
Creatinine, Ser: 0.74 mg/dL (ref 0.44–1.00)
GFR, Estimated: >60 mL/min (ref >60)
Glucose, Bld: 104 mg/dL — ABNORMAL HIGH (ref 70–99)
Potassium: 3.8 mmol/L (ref 3.5–5.1)
Sodium: 138 mmol/L (ref 135–145)
Total Bilirubin: 0.3 mg/dL (ref 0.3–1.2)
Total Protein: 6.1 g/dL — ABNORMAL LOW (ref 6.5–8.1)

## 2022-10-05 LAB — CBC
HCT: 36.1 % (ref 36.0–46.0)
Hemoglobin: 11.7 g/dL — ABNORMAL LOW (ref 12.0–15.0)
MCH: 29 pg (ref 26.0–34.0)
MCHC: 32.4 g/dL (ref 30.0–36.0)
MCV: 89.4 fL (ref 80.0–100.0)
Platelets: 335 10*3/uL (ref 150–400)
RBC: 4.04 MIL/uL (ref 3.87–5.11)
RDW: 13.1 % (ref 11.5–15.5)
WBC: 10.1 10*3/uL (ref 4.0–10.5)
nRBC: 0 % (ref 0.0–0.2)

## 2022-10-05 LAB — HIV ANTIBODY (ROUTINE TESTING W REFLEX): HIV Screen 4th Generation wRfx: NONREACTIVE

## 2022-10-05 SURGERY — LAPAROSCOPIC CHOLECYSTECTOMY WITH INTRAOPERATIVE CHOLANGIOGRAM
Anesthesia: General

## 2022-10-05 MED ORDER — FENTANYL CITRATE (PF) 250 MCG/5ML IJ SOLN
INTRAMUSCULAR | Status: AC
  Filled 2022-10-05: qty 5

## 2022-10-05 MED ORDER — FENTANYL CITRATE PF 50 MCG/ML IJ SOSY
25.0000 ug | PREFILLED_SYRINGE | INTRAMUSCULAR | Status: DC | PRN
  Administered 2022-10-05 (×3): 50 ug via INTRAVENOUS

## 2022-10-05 MED ORDER — ONDANSETRON HCL 4 MG/2ML IJ SOLN
INTRAMUSCULAR | Status: AC
  Filled 2022-10-05: qty 2

## 2022-10-05 MED ORDER — OXYCODONE HCL 5 MG/5ML PO SOLN: 5.0000 mg | ORAL | Status: DC | PRN

## 2022-10-05 MED ORDER — DEXAMETHASONE SODIUM PHOSPHATE 10 MG/ML IJ SOLN
INTRAMUSCULAR | Status: DC | PRN
  Administered 2022-10-05: 10 mg via INTRAVENOUS

## 2022-10-05 MED ORDER — OXYCODONE HCL 5 MG PO TABS: 5.0000 mg | ORAL_TABLET | ORAL | 0 refills | Status: DC | PRN

## 2022-10-05 MED ORDER — LIDOCAINE 2% (20 MG/ML) 5 ML SYRINGE
INTRAMUSCULAR | Status: DC | PRN
  Administered 2022-10-05: 60 mg via INTRAVENOUS

## 2022-10-05 MED ORDER — PROPOFOL 10 MG/ML IV BOLUS
INTRAVENOUS | Status: DC | PRN
  Administered 2022-10-05: 20 mg via INTRAVENOUS
  Administered 2022-10-05: 150 mg via INTRAVENOUS

## 2022-10-05 MED ORDER — LACTATED RINGERS IV SOLN: INTRAVENOUS | Status: DC | PRN

## 2022-10-05 MED ORDER — MIDAZOLAM HCL 5 MG/5ML IJ SOLN
INTRAMUSCULAR | Status: DC | PRN
  Administered 2022-10-05: 2 mg via INTRAVENOUS

## 2022-10-05 MED ORDER — BUPIVACAINE LIPOSOME 1.3 % IJ SUSP
INTRAMUSCULAR | Status: DC | PRN
  Administered 2022-10-05: 20 mL

## 2022-10-05 MED ORDER — IBUPROFEN 600 MG PO TABS: 600.0000 mg | ORAL_TABLET | Freq: Four times a day (QID) | ORAL | Status: AC

## 2022-10-05 MED ORDER — METHOCARBAMOL 500 MG PO TABS
750.0000 mg | ORAL_TABLET | Freq: Four times a day (QID) | ORAL | Status: DC
  Administered 2022-10-05 (×2): 750 mg via ORAL
  Filled 2022-10-05 (×2): qty 2

## 2022-10-05 MED ORDER — FENTANYL CITRATE (PF) 100 MCG/2ML IJ SOLN
INTRAMUSCULAR | Status: DC | PRN
  Administered 2022-10-05 (×3): 50 ug via INTRAVENOUS

## 2022-10-05 MED ORDER — BUPIVACAINE LIPOSOME 1.3 % IJ SUSP
INTRAMUSCULAR | Status: AC
  Filled 2022-10-05: qty 20

## 2022-10-05 MED ORDER — 0.9 % SODIUM CHLORIDE (POUR BTL) OPTIME
TOPICAL | Status: DC | PRN
  Administered 2022-10-05: 1000 mL

## 2022-10-05 MED ORDER — FENTANYL CITRATE PF 50 MCG/ML IJ SOSY
PREFILLED_SYRINGE | INTRAMUSCULAR | Status: AC
  Filled 2022-10-05: qty 1

## 2022-10-05 MED ORDER — ACETAMINOPHEN 500 MG PO TABS
1000.0000 mg | ORAL_TABLET | Freq: Four times a day (QID) | ORAL | Status: DC
  Administered 2022-10-05 (×2): 1000 mg via ORAL
  Filled 2022-10-05 (×2): qty 2

## 2022-10-05 MED ORDER — BUPIVACAINE HCL (PF) 0.25 % IJ SOLN
INTRAMUSCULAR | Status: DC | PRN
  Administered 2022-10-05: 20 mL

## 2022-10-05 MED ORDER — ACETAMINOPHEN 500 MG PO TABS: 1000.0000 mg | ORAL_TABLET | Freq: Four times a day (QID) | ORAL | 3 refills | Status: AC

## 2022-10-05 MED ORDER — ONDANSETRON HCL 4 MG/2ML IJ SOLN
INTRAMUSCULAR | Status: DC | PRN
  Administered 2022-10-05: 4 mg via INTRAVENOUS

## 2022-10-05 MED ORDER — LIDOCAINE HCL (PF) 2 % IJ SOLN
INTRAMUSCULAR | Status: AC
  Filled 2022-10-05: qty 5

## 2022-10-05 MED ORDER — KETOROLAC TROMETHAMINE 30 MG/ML IJ SOLN
30.0000 mg | Freq: Once | INTRAMUSCULAR | Status: AC
  Administered 2022-10-05: 30 mg via INTRAVENOUS

## 2022-10-05 MED ORDER — SUCCINYLCHOLINE CHLORIDE 200 MG/10ML IV SOSY
PREFILLED_SYRINGE | INTRAVENOUS | Status: AC
  Filled 2022-10-05: qty 10

## 2022-10-05 MED ORDER — SUCCINYLCHOLINE CHLORIDE 200 MG/10ML IV SOSY
PREFILLED_SYRINGE | INTRAVENOUS | Status: DC | PRN
  Administered 2022-10-05: 100 mg via INTRAVENOUS

## 2022-10-05 MED ORDER — ROCURONIUM BROMIDE 100 MG/10ML IV SOLN
INTRAVENOUS | Status: DC | PRN
  Administered 2022-10-05: 10 mg via INTRAVENOUS
  Administered 2022-10-05: 40 mg via INTRAVENOUS

## 2022-10-05 MED ORDER — BUPIVACAINE HCL 0.25 % IJ SOLN
INTRAMUSCULAR | Status: AC
  Filled 2022-10-05: qty 1

## 2022-10-05 MED ORDER — LACTATED RINGERS IR SOLN
Status: DC | PRN
  Administered 2022-10-05: 1000 mL

## 2022-10-05 MED ORDER — SUGAMMADEX SODIUM 200 MG/2ML IV SOLN
INTRAVENOUS | Status: DC | PRN
  Administered 2022-10-05: 200 mg via INTRAVENOUS

## 2022-10-05 MED ORDER — KETOROLAC TROMETHAMINE 30 MG/ML IJ SOLN: 30.0000 mg | Freq: Four times a day (QID) | INTRAMUSCULAR | Status: DC

## 2022-10-05 MED ORDER — PROPOFOL 10 MG/ML IV BOLUS
INTRAVENOUS | Status: AC
  Filled 2022-10-05: qty 20

## 2022-10-05 MED ORDER — AMISULPRIDE (ANTIEMETIC) 5 MG/2ML IV SOLN: 10.0000 mg | Freq: Once | INTRAVENOUS | Status: DC | PRN

## 2022-10-05 MED ORDER — ROCURONIUM BROMIDE 10 MG/ML (PF) SYRINGE
PREFILLED_SYRINGE | INTRAVENOUS | Status: AC
  Filled 2022-10-05: qty 10

## 2022-10-05 MED ORDER — GLYCOPYRROLATE 0.2 MG/ML IJ SOLN
INTRAMUSCULAR | Status: DC | PRN
  Administered 2022-10-05: .1 mg via INTRAVENOUS

## 2022-10-05 MED ORDER — MIDAZOLAM HCL 2 MG/2ML IJ SOLN
INTRAMUSCULAR | Status: AC
  Filled 2022-10-05: qty 2

## 2022-10-05 MED ORDER — METHOCARBAMOL 750 MG PO TABS: 750.0000 mg | ORAL_TABLET | Freq: Four times a day (QID) | ORAL | 1 refills | Status: DC

## 2022-10-05 MED ORDER — DEXAMETHASONE SODIUM PHOSPHATE 10 MG/ML IJ SOLN
INTRAMUSCULAR | Status: AC
  Filled 2022-10-05: qty 1

## 2022-10-05 MED ORDER — KETOROLAC TROMETHAMINE 30 MG/ML IJ SOLN
INTRAMUSCULAR | Status: AC
  Filled 2022-10-05: qty 1

## 2022-10-05 MED ORDER — FENTANYL CITRATE PF 50 MCG/ML IJ SOSY
PREFILLED_SYRINGE | INTRAMUSCULAR | Status: AC
  Filled 2022-10-05: qty 2

## 2022-10-05 SURGICAL SUPPLY — 44 items
ADH SKN CLS APL DERMABOND .7 (GAUZE/BANDAGES/DRESSINGS) ×1
ADH SKNCLS APL OCTYL .7 VIOL (GAUZE/BANDAGES/DRESSINGS) ×1
APL PRP STRL LF DISP 70% ISPRP (MISCELLANEOUS) ×1
APPLIER CLIP 5 13 M/L LIGAMAX5 (MISCELLANEOUS) ×1
APR CLP MED LRG 5 ANG JAW (MISCELLANEOUS) ×1
BAG SPEC RTRVL 10 TROC 200 (ENDOMECHANICALS) ×1
CHLORAPREP W/TINT 26 (MISCELLANEOUS) ×1 IMPLANT
CLIP APPLIE 5 13 M/L LIGAMAX5 (MISCELLANEOUS) ×1 IMPLANT
COVER MAYO STAND XLG (MISCELLANEOUS) ×1 IMPLANT
COVER SURGICAL LIGHT HANDLE (MISCELLANEOUS) ×1 IMPLANT
DERMABOND ADVANCED .7 DNX12 (GAUZE/BANDAGES/DRESSINGS) ×1 IMPLANT
DISSECTOR BLUNT TIP ENDO 5MM (MISCELLANEOUS) IMPLANT
DRAPE C-ARM 42X120 X-RAY (DRAPES) IMPLANT
ELECT PENCIL ROCKER SW 15FT (MISCELLANEOUS) ×1 IMPLANT
ENDOLOOP SUT PDS II 0 18 (SUTURE) IMPLANT
GLOVE BIO SURGEON STRL SZ 6.5 (GLOVE) ×1 IMPLANT
GLOVE BIOGEL PI IND STRL 6 (GLOVE) ×1 IMPLANT
GOWN STRL REUS W/ TWL LRG LVL3 (GOWN DISPOSABLE) ×3 IMPLANT
GOWN STRL REUS W/TWL LRG LVL3 (GOWN DISPOSABLE) ×3
IRRIG SUCT STRYKERFLOW 2 WTIP (MISCELLANEOUS) ×1
IRRIGATION SUCT STRKRFLW 2 WTP (MISCELLANEOUS) IMPLANT
KIT BASIN OR (CUSTOM PROCEDURE TRAY) ×1 IMPLANT
L-HOOK LAP DISP 36CM (ELECTROSURGICAL) ×1
LHOOK LAP DISP 36CM (ELECTROSURGICAL) ×1 IMPLANT
NDL INSUFFLATION 14GA 120MM (NEEDLE) IMPLANT
NEEDLE INSUFFLATION 14GA 120MM (NEEDLE) ×1 IMPLANT
NS IRRIG 1000ML POUR BTL (IV SOLUTION) ×1 IMPLANT
PAD ARMBOARD 7.5X6 YLW CONV (MISCELLANEOUS) ×1 IMPLANT
POUCH RETRIEVAL ECOSAC 10 (ENDOMECHANICALS) ×1 IMPLANT
SCISSORS LAP 5X35 DISP (ENDOMECHANICALS) ×1 IMPLANT
SET CHOLANGIOGRAPH MIX (MISCELLANEOUS) IMPLANT
SET TUBE SMOKE EVAC HIGH FLOW (TUBING) ×1 IMPLANT
SLEEVE ADV FIXATION 5X100MM (TROCAR) ×2 IMPLANT
SUT MNCRL AB 4-0 PS2 18 (SUTURE) ×1 IMPLANT
SUT VIC AB 0 UR5 27 (SUTURE) IMPLANT
SUT VICRYL 0 UR6 27IN ABS (SUTURE) IMPLANT
SYS BAG RETRIEVAL 10MM (BASKET)
SYSTEM BAG RETRIEVAL 10MM (BASKET) IMPLANT
TRAY LAPAROSCOPIC (CUSTOM PROCEDURE TRAY) ×1 IMPLANT
TROCAR ADV FIXATION 5X100MM (TROCAR) IMPLANT
TROCAR BALLN 12MMX100 BLUNT (TROCAR) ×1 IMPLANT
TROCAR XCEL NON-BLD 5MMX100MML (ENDOMECHANICALS) IMPLANT
TROCAR Z-THREAD FIOS 5X100MM (TROCAR) ×1 IMPLANT
WATER STERILE IRR 1000ML POUR (IV SOLUTION) ×1 IMPLANT

## 2022-10-05 NOTE — Op Note (Signed)
   Operative Note  Date: 10/05/2022  Procedure: laparoscopic cholecystectomy  Pre-op diagnosis: acute cholecystitis Post-op diagnosis: same  Indication and clinical history: The patient is a 42 y.o. year old female with acute cholecystitis  Surgeon: Diamantina Monks, MD Assistant: Kirstie Mirza, PA  Anesthesiologist: R. Sampson Goon, MD Anesthesia: General  Findings:  Specimen: gallbladder EBL: <5cc Drains/Implants: none  Disposition: PACU - hemodynamically stable.  Description of procedure: The patient was positioned supine on the operating room table. Time-out was performed verifying correct patient, procedure, signature of informed consent, and administration of pre-operative antibiotics. General anesthetic induction and intubation were uneventful. The abdomen was prepped and draped in the usual sterile fashion. An infra-umbilical incision was made using an open technique using zero vicryl stay sutures on either side of the fascia and a 10mm Hassan port inserted. There was difficulty with entry at the umbilicus due to poor visualization, so abdominal entry was obtained in the LUQ via optiview technique. After establishing pneumoperitoneum, which the patient tolerated well, the abdominal cavity was inspected and no injury of any intra-abdominal structures was identified. Additional ports were placed under direct visualization and using local anesthetic: two 5mm ports in the right subcostal region and a 5mm port in the epigastric region. The patient was re-positioned to reverse Trendelenburg and right side up. Adhesed omentum was lysed from the anterior abdominal wall around the umbilicus. Adhesiolysis was performed to expose the gallbladder, which was then retracted cephalad. The infundibulum was identified and retracted toward the right lower quadrant. The peritoneum was incised over the infundibulum and the triangle of Calot dissected to expose the critical view of safety. With clear  identification and isolation of the cystic duct and cystic artery, the cystic artery was doubly clipped and divided. After this, the cystic duct was identified as a single structure entering the gallbladder, and was also doubly clipped and divided. The gallbladder was dissected off the liver bed using electrocautery and hemostasis of the liver bed was confirmed prior to separation of the final peritoneal attachments of the gallbladder to the liver bed. The gallbladder fossa was irrigated and fluid returned clear. After transection of the final peritoneal attachments, the gallbladder was placed in an endoscopic specimen retrieval bag, removed via the umbilical port site, and sent to pathology as a permanent specimen. The gallbladder fossa was inspected confirming hemostasis, the absence of bile leakage from the cystic duct stump, and correct placement of clips on the cystic artery and cystic duct stumps. The abdomen was desufflated and the fascia of the umbilical port site was closed using the previously placed stay sutures. Additional local anesthetic was administered at the umbilical port site.  The skin of all incisions was closed with 4-0 monocryl. Sterile dressings were applied. All sponge and instrument counts were correct at the conclusion of the procedure. The patient was awakened from anesthesia, extubated uneventfully, and transported to the PACU - hemodynamically stable.. There were no complications.    Upon entering the abdomen (organ space), I encountered infection of the gallbladder .  CASE DATA:  Type of patient?: DOW CASE (Surgical Hospitalist [facility])  Status of Case? URGENT Add On  Infection Present At Time Of Surgery (PATOS)?  INFECTION of the gallbladder    Diamantina Monks, MD General and Trauma Surgery [facility] Surgery

## 2022-10-05 NOTE — Progress Notes (Signed)
Patient was given discharge instructions, and all questions were answered. Patient was voiding, tolerating her diet, and walked in the hall with no problems. Patient was stable for discharge and was taken to the main exit by wheelchair.

## 2022-10-05 NOTE — Progress Notes (Signed)
   10/05/22 0936  TOC Brief Assessment  Insurance and Status Reviewed  Patient has primary care physician Yes  Home environment has been reviewed home with spouse  Prior level of function: independent  Prior/Current Home Services No current home services  Social Determinants of Health Reivew SDOH reviewed no interventions necessary  Readmission risk has been reviewed Yes  Transition of care needs no transition of care needs at this time

## 2022-10-05 NOTE — Progress Notes (Signed)
General Surgery Follow Up Note  Subjective:    Overnight Issues:   Objective:  Vital signs for last 24 hours: Temp:  [98 F (36.7 C)-98.7 F (37.1 C)] 98.4 F (36.9 C) (06/24 0448) Pulse Rate:  [66-103] 77 (06/24 0448) Resp:  [14-18] 14 (06/24 0448) BP: (94-120)/(63-81) 97/65 (06/24 0448) SpO2:  [96 %-100 %] 99 % (06/24 0448) Weight:  [77.1 kg-85.3 kg] 77.1 kg (06/23 1324)  Hemodynamic parameters for last 24 hours:    Intake/Output from previous day: 06/23 0701 - 06/24 0700 In: 2506.9 [P.O.:120; I.V.:891.2; IV Piggyback:1495.7] Out: -   Intake/Output this shift: No intake/output data recorded.  Vent settings for last 24 hours:    Physical Exam:  Gen: comfortable, no distress Neuro: follows commands, alert, communicative HEENT: PERRL Neck: supple CV: RRR Pulm: unlabored breathing on RA Abd: soft, RUQ TTP GU: urine clear and yellow, +spontaneous void Extr: wwp, no edema  Results for orders placed or performed during the hospital encounter of 10/04/22 (from the past 24 hour(s))  Lipase, blood     Status: None   Collection Time: 10/04/22  1:32 PM  Result Value Ref Range   Lipase 31 11 - 51 U/L  Comprehensive metabolic panel     Status: Abnormal   Collection Time: 10/04/22  1:32 PM  Result Value Ref Range   Sodium 137 135 - 145 mmol/L   Potassium 3.8 3.5 - 5.1 mmol/L   Chloride 107 98 - 111 mmol/L   CO2 19 (L) 22 - 32 mmol/L   Glucose, Bld 110 (H) 70 - 99 mg/dL   BUN 21 (H) 6 - 20 mg/dL   Creatinine, Ser 4.09 0.44 - 1.00 mg/dL   Calcium 9.2 8.9 - 81.1 mg/dL   Total Protein 7.4 6.5 - 8.1 g/dL   Albumin 3.9 3.5 - 5.0 g/dL   AST 20 15 - 41 U/L   ALT 16 0 - 44 U/L   Alkaline Phosphatase 28 (L) 38 - 126 U/L   Total Bilirubin 0.4 0.3 - 1.2 mg/dL   GFR, Estimated >91 >47 mL/min   Anion gap 11 5 - 15  CBC     Status: Abnormal   Collection Time: 10/04/22  1:32 PM  Result Value Ref Range   WBC 23.8 (H) 4.0 - 10.5 K/uL   RBC 4.62 3.87 - 5.11 MIL/uL    Hemoglobin 13.3 12.0 - 15.0 g/dL   HCT 82.9 56.2 - 13.0 %   MCV 86.6 80.0 - 100.0 fL   MCH 28.8 26.0 - 34.0 pg   MCHC 33.3 30.0 - 36.0 g/dL   RDW 86.5 78.4 - 69.6 %   Platelets 384 150 - 400 K/uL   nRBC 0.0 0.0 - 0.2 %  Urinalysis, Routine w reflex microscopic -Urine, Clean Catch     Status: Abnormal   Collection Time: 10/04/22  1:32 PM  Result Value Ref Range   Color, Urine YELLOW YELLOW   APPearance CLOUDY (A) CLEAR   Specific Gravity, Urine >=1.030 1.005 - 1.030   pH 5.0 5.0 - 8.0   Glucose, UA NEGATIVE NEGATIVE mg/dL   Hgb urine dipstick NEGATIVE NEGATIVE   Bilirubin Urine SMALL (A) NEGATIVE   Ketones, ur NEGATIVE NEGATIVE mg/dL   Protein, ur NEGATIVE NEGATIVE mg/dL   Nitrite NEGATIVE NEGATIVE   Leukocytes,Ua NEGATIVE NEGATIVE  Pregnancy, urine     Status: None   Collection Time: 10/04/22  1:32 PM  Result Value Ref Range   Preg Test, Ur NEGATIVE NEGATIVE  CBC  Status: Abnormal   Collection Time: 10/04/22  1:32 PM  Result Value Ref Range   WBC 24.0 (H) 4.0 - 10.5 K/uL   RBC 4.57 3.87 - 5.11 MIL/uL   Hemoglobin 13.3 12.0 - 15.0 g/dL   HCT 16.1 09.6 - 04.5 %   MCV 86.9 80.0 - 100.0 fL   MCH 29.1 26.0 - 34.0 pg   MCHC 33.5 30.0 - 36.0 g/dL   RDW 40.9 81.1 - 91.4 %   Platelets 389 150 - 400 K/uL   nRBC 0.0 0.0 - 0.2 %  Differential     Status: Abnormal   Collection Time: 10/04/22  1:32 PM  Result Value Ref Range   Neutrophils Relative % 80 %   Neutro Abs 19.1 (H) 1.7 - 7.7 K/uL   Lymphocytes Relative 10 %   Lymphs Abs 2.5 0.7 - 4.0 K/uL   Monocytes Relative 6 %   Monocytes Absolute 1.4 (H) 0.1 - 1.0 K/uL   Eosinophils Relative 3 %   Eosinophils Absolute 0.8 (H) 0.0 - 0.5 K/uL   Basophils Relative 1 %   Basophils Absolute 0.1 0.0 - 0.1 K/uL   Immature Granulocytes 0 %   Abs Immature Granulocytes 0.09 (H) 0.00 - 0.07 K/uL  Comprehensive metabolic panel     Status: Abnormal   Collection Time: 10/05/22  4:11 AM  Result Value Ref Range   Sodium 138 135 - 145  mmol/L   Potassium 3.8 3.5 - 5.1 mmol/L   Chloride 113 (H) 98 - 111 mmol/L   CO2 20 (L) 22 - 32 mmol/L   Glucose, Bld 104 (H) 70 - 99 mg/dL   BUN 16 6 - 20 mg/dL   Creatinine, Ser 7.82 0.44 - 1.00 mg/dL   Calcium 8.4 (L) 8.9 - 10.3 mg/dL   Total Protein 6.1 (L) 6.5 - 8.1 g/dL   Albumin 3.2 (L) 3.5 - 5.0 g/dL   AST 30 15 - 41 U/L   ALT 21 0 - 44 U/L   Alkaline Phosphatase 25 (L) 38 - 126 U/L   Total Bilirubin 0.3 0.3 - 1.2 mg/dL   GFR, Estimated >95 >62 mL/min   Anion gap 5 5 - 15  CBC     Status: Abnormal   Collection Time: 10/05/22  4:11 AM  Result Value Ref Range   WBC 10.1 4.0 - 10.5 K/uL   RBC 4.04 3.87 - 5.11 MIL/uL   Hemoglobin 11.7 (L) 12.0 - 15.0 g/dL   HCT 13.0 86.5 - 78.4 %   MCV 89.4 80.0 - 100.0 fL   MCH 29.0 26.0 - 34.0 pg   MCHC 32.4 30.0 - 36.0 g/dL   RDW 69.6 29.5 - 28.4 %   Platelets 335 150 - 400 K/uL   nRBC 0.0 0.0 - 0.2 %    Assessment & Plan:  Present on Admission:  Cholecystitis with cholelithiasis    LOS: 1 day   Additional comments:I reviewed the patient's new clinical lab test results.   and I reviewed the patients new imaging test results.    Acute cholecystitis - plan lap chole today. Informed consent was obtained after detailed explanation of risks, including bleeding, infection, biloma, hematoma, injury to common bile duct, need for IOC to delineate anatomy, and need for conversion to open procedure. All questions answered to the patient's satisfaction. FEN -  NPO, okay for sips with meds DVT - SCDs, LMWH Dispo - med-surg    Diamantina Monks, MD Trauma & General Surgery Please use [email or web address]  to contact on call provider  10/05/2022  *Care during the described time interval was provided by me. I have reviewed this patient's available data, including medical history, events of note, physical examination and test results as part of my evaluation.

## 2022-10-05 NOTE — Anesthesia Preprocedure Evaluation (Signed)
Anesthesia Evaluation  Patient identified by MRN, date of birth, ID band Patient awake    Reviewed: Allergy & Precautions, NPO status , Patient's Chart, lab work & pertinent test results  Airway Mallampati: II  TM Distance: >3 FB Neck ROM: Full    Dental  (+) Dental Advisory Given   Pulmonary Current Smoker   breath sounds clear to auscultation       Cardiovascular negative cardio ROS  Rhythm:Regular Rate:Normal     Neuro/Psych negative neurological ROS     GI/Hepatic Neg liver ROS,,,  Endo/Other  negative endocrine ROS    Renal/GU negative Renal ROS     Musculoskeletal   Abdominal   Peds  Hematology negative hematology ROS (+)   Anesthesia Other Findings   Reproductive/Obstetrics                             Anesthesia Physical Anesthesia Plan  ASA: 2  Anesthesia Plan: General   Post-op Pain Management: Ofirmev IV (intra-op)*   Induction: Intravenous  PONV Risk Score and Plan: 2 and Dexamethasone, Ondansetron and Treatment may vary due to age or medical condition  Airway Management Planned: Oral ETT  Additional Equipment: None  Intra-op Plan:   Post-operative Plan: Extubation in OR  Informed Consent: I have reviewed the patients History and Physical, chart, labs and discussed the procedure including the risks, benefits and alternatives for the proposed anesthesia with the patient or authorized representative who has indicated his/her understanding and acceptance.     Dental advisory given  Plan Discussed with: CRNA  Anesthesia Plan Comments:        Anesthesia Quick Evaluation

## 2022-10-05 NOTE — Transfer of Care (Signed)
Immediate Anesthesia Transfer of Care Note  Patient: Allison Cunningham  Procedure(s) Performed: LAPAROSCOPIC CHOLECYSTECTOMY  Patient Location: PACU  Anesthesia Type:General  Level of Consciousness: drowsy and patient cooperative  Airway & Oxygen Therapy: Patient Spontanous Breathing and Patient connected to face mask oxygen  Post-op Assessment: Report given to RN and Post -op Vital signs reviewed and stable  Post vital signs: Reviewed and stable  Last Vitals:  Vitals Value Taken Time  BP 122/78   Temp    Pulse 87   Resp 14   SpO2 99%     Last Pain:  Vitals:   10/05/22 1202  TempSrc: Oral  PainSc: 7       Patients Stated Pain Goal: 2 (10/05/22 1202)  Complications: No notable events documented.

## 2022-10-05 NOTE — Progress Notes (Signed)
Pacu RN Report to floor given  Gave report to  [employer]. Room: 1303    Discussed surgery, meds given in OR and Pacu, VS, IV fluids given, EBL, urine output, pain and other pertinent information. Also discussed if pt had any family or friends here or belongings with them.   Pt stable, pain 5/10 did not want anymore pain med here, will ask for something on the floor. She was given Fentanyl 150 mcg and 30 Toradol IV. Abdomen is soft with 4 lapsites/dermabond, CDI, no bleeding or hematoma noted.   Pt may go home this evening per Dr Bedelia Person.   Pt exits my care.

## 2022-10-05 NOTE — Anesthesia Procedure Notes (Signed)
Procedure Name: Intubation Date/Time: 10/05/2022 1:42 PM  Performed by: Marny Lowenstein, CRNAPre-anesthesia Checklist: Patient identified, Emergency Drugs available, Suction available and Patient being monitored Patient Re-evaluated:Patient Re-evaluated prior to induction Oxygen Delivery Method: Circle system utilized Preoxygenation: Pre-oxygenation with 100% oxygen Induction Type: IV induction, Rapid sequence and Cricoid Pressure applied Laryngoscope Size: Glidescope and 3 Grade View: Grade I Tube type: Oral Tube size: 7.0 mm Number of attempts: 1 Airway Equipment and Method: Rigid stylet Placement Confirmation: ETT inserted through vocal cords under direct vision, positive ETCO2 and breath sounds checked- equal and bilateral Secured at: 21 cm Tube secured with: Tape Dental Injury: Teeth and Oropharynx as per pre-operative assessment  Comments: Pt with limited mouth opening/small airway

## 2022-10-06 ENCOUNTER — Encounter (HOSPITAL_COMMUNITY): Payer: Self-pay | Admitting: Surgery

## 2022-10-07 LAB — SURGICAL PATHOLOGY

## 2022-10-07 NOTE — Anesthesia Postprocedure Evaluation (Signed)
Anesthesia Post Note  Patient: Allison Cunningham  Procedure(s) Performed: LAPAROSCOPIC CHOLECYSTECTOMY     Patient location during evaluation: PACU Anesthesia Type: General Level of consciousness: awake and alert Pain management: pain level controlled Vital Signs Assessment: post-procedure vital signs reviewed and stable Respiratory status: spontaneous breathing, nonlabored ventilation, respiratory function stable and patient connected to nasal cannula oxygen Cardiovascular status: blood pressure returned to baseline and stable Postop Assessment: no apparent nausea or vomiting Anesthetic complications: no   No notable events documented.  Last Vitals:  Vitals:   10/05/22 1744 10/05/22 1842  BP: 101/65 114/68  Pulse: 76 89  Resp: 16 16  Temp: 37 C 36.7 C  SpO2: 99% 98%    Last Pain:  Vitals:   10/05/22 1842  TempSrc: Oral  PainSc:    Pain Goal: Patients Stated Pain Goal: 3 (10/05/22 1620)                 Kennieth Rad

## 2022-10-09 NOTE — Discharge Summary (Signed)
Patient ID: Allison Cunningham [id number] [birth year] 42 y.o.  Admit date: 10/04/2022 Discharge date: 10/09/2022  Discharge Diagnosis Acute cholecystitis s/p laparoscopic cholecystectomy by Dr. Bedelia Person 6/24  Consultants None  Reason for Admission: The patient is a 42 year old white female who presents with right upper quadrant pain that started about 2 weeks ago. The pain has gradually gotten more severe over the last 2 weeks. The pain has been associated with significant nausea and vomiting. The pain seems to radiate into her back. She finally came to the emergency department where an ultrasound showed some thickening of the gallbladder wall and a gallbladder full of small stones. Her liver functions were normal. Her white count was 24,000. She does smoke and has a history of schizophrenia   Procedures Dr. Bedelia Person - Laparoscopic Cholecystectomy - 6/24  Hospital Course:  The patient was admitted and underwent a laparoscopic cholecystectomy. She tolerated the procedure well. Patient was discharged POD 0. I was not directly involved in this patient's care and did not see the patient during their hospital stay, therefore the information in this discharge summary was taken entirely from the chart.    Allergies as of 10/05/2022       Reactions   Lamotrigine Rash   Cephalexin Nausea And Vomiting   Clarithromycin Hives   Amoxicillin-pot Clavulanate Rash   Penicillins Rash        Medication List     STOP taking these medications    carvedilol 3.125 MG tablet Commonly known as: COREG   cephALEXin 500 MG capsule Commonly known as: KEFLEX   citalopram 20 MG tablet Commonly known as: CeleXA   clindamycin 300 MG capsule Commonly known as: CLEOCIN   dicyclomine 20 MG tablet Commonly known as: BENTYL   doxycycline 100 MG tablet Commonly known as: VIBRA-TABS   Eliquis 5 MG Tabs tablet Generic drug: apixaban   fenofibrate 160 MG tablet   fluticasone 50 MCG/ACT nasal  spray Commonly known as: FLONASE   HYDROcodone-acetaminophen 5-325 MG tablet Commonly known as: NORCO/VICODIN   Invega Sustenna 156 MG/ML Susy injection Generic drug: paliperidone   ipratropium 0.03 % nasal spray Commonly known as: ATROVENT   ketorolac 10 MG tablet Commonly known as: TORADOL   modafinil 200 MG tablet Commonly known as: PROVIGIL   nitrofurantoin (macrocrystal-monohydrate) 100 MG capsule Commonly known as: MACROBID   oxyCODONE-acetaminophen 5-325 MG tablet Commonly known as: PERCOCET/ROXICET   prochlorperazine 10 MG tablet Commonly known as: COMPAZINE   promethazine 25 MG tablet Commonly known as: PHENERGAN   Qelbree 200 MG 24 hr capsule Generic drug: viloxazine ER   sulfamethoxazole-trimethoprim 800-160 MG tablet Commonly known as: Bactrim DS       TAKE these medications    acetaminophen 500 MG tablet Commonly known as: TYLENOL Take 2 tablets (1,000 mg total) by mouth every 6 (six) hours.   Aimovig 140 MG/ML Soaj Generic drug: Erenumab-aooe Inject 140 mg as directed every 30 (thirty) days. What changed: Another medication with the same name was removed. Continue taking this medication, and follow the directions you see here.   albuterol 108 (90 Base) MCG/ACT inhaler Commonly known as: VENTOLIN HFA Inhale 2 puffs into the lungs every 4 (four) hours as needed. What changed: reasons to take this   Armodafinil 150 MG tablet Take 1 tablet (150 mg total) by mouth daily. Max Daily Amount: 150 mg. What changed: Another medication with the same name was removed. Continue taking this medication, and follow the directions you see here.   aspirin  EC 81 MG tablet Take 81 mg by mouth every evening. Swallow whole.   atomoxetine 40 MG capsule Commonly known as: Strattera Take 1 capsule (40 mg total) by mouth daily with food.   atorvastatin 10 MG tablet Commonly known as: LIPITOR Take 1 tablet (10 mg total) by mouth at bedtime. What changed: Another  medication with the same name was removed. Continue taking this medication, and follow the directions you see here.   busPIRone 10 MG tablet Commonly known as: BUSPAR Take 10 mg by mouth 3 (three) times daily. What changed: Another medication with the same name was removed. Continue taking this medication, and follow the directions you see here.   busPIRone 10 MG tablet Commonly known as: BUSPAR Take 1 tablet (10 mg total) by mouth in the morning, at noon, and at bedtime. What changed: Another medication with the same name was removed. Continue taking this medication, and follow the directions you see here.   clonazePAM 0.5 MG tablet Commonly known as: KlonoPIN Take 1 tablet (0.5 mg total) by mouth daily. What changed: Another medication with the same name was removed. Continue taking this medication, and follow the directions you see here.   diclofenac 75 MG EC tablet Commonly known as: VOLTAREN Take 1 tablet (75 mg total) by mouth 2 (two) times daily.   gabapentin 300 MG capsule Commonly known as: NEURONTIN Take one capsule (300 mg dose) by mouth at bedtime. What changed: Another medication with the same name was removed. Continue taking this medication, and follow the directions you see here.   ibuprofen 600 MG tablet Commonly known as: ADVIL Take 1 tablet (600 mg total) by mouth 4 (four) times daily. What changed:  medication strength how much to take when to take this reasons to take this   Ingrezza 80 MG capsule Generic drug: valbenazine Take one capsule by mouth daily What changed: Another medication with the same name was removed. Continue taking this medication, and follow the directions you see here.   metFORMIN 750 MG 24 hr tablet Commonly known as: GLUCOPHAGE-XR Take 1 tablet (750 mg total) by mouth daily. What changed: when to take this   methocarbamol 750 MG tablet Commonly known as: Robaxin-750 Take 1 tablet (750 mg total) by mouth 4 (four) times  daily. What changed:  medication strength how much to take when to take this   omeprazole 40 MG capsule Commonly known as: PRILOSEC Take 1 capsule by mouth daily. What changed: Another medication with the same name was removed. Continue taking this medication, and follow the directions you see here.   ondansetron 4 MG disintegrating tablet Commonly known as: ZOFRAN-ODT Take 1 tablet (4 mg total) by mouth every 8 (eight) hours as needed for nausea or vomiting.   oxcarbazepine 600 MG tablet Commonly known as: Trileptal Take 1 tablet by mouth once every night at bedtime What changed: Another medication with the same name was removed. Continue taking this medication, and follow the directions you see here.   oxyCODONE 5 MG immediate release tablet Commonly known as: Roxicodone Take 1 tablet (5 mg total) by mouth every 4 (four) hours as needed for severe pain.   paliperidone 3 MG 24 hr tablet Commonly known as: Invega Take 1 tablet (3 mg total) by mouth at bedtime.   prazosin 5 MG capsule Commonly known as: MINIPRESS Take 1 capsule (5 mg total) by mouth at bedtime. What changed: Another medication with the same name was removed. Continue taking this medication, and follow the  directions you see here.   SUMAtriptan 100 MG tablet Commonly known as: IMITREX Take 1 tablet by mouth at onset of headache. May repeat in 2 hours if headache pain persists. **Max of 2 tablets in 24 hours** What changed:  how much to take how to take this when to take this reasons to take this Another medication with the same name was removed. Continue taking this medication, and follow the directions you see here.   topiramate 50 MG tablet Commonly known as: TOPAMAX Take 3 tablets (150 mg total) by mouth at bedtime. What changed: Another medication with the same name was removed. Continue taking this medication, and follow the directions you see here.   Tri-Lo-Sprintec 0.18/0.215/0.25 MG-25 MCG  tab Generic drug: Norgestimate-Ethinyl Estradiol Triphasic TAKE 1 TABLET BY MOUTH ONCE DAILY What changed: when to take this   triamcinolone cream 0.1 % Commonly known as: KENALOG Apply on to the skin 2 times daily as needed What changed:  how much to take when to take this reasons to take this          Follow-up Information     Wilfred Curtis, MD .   Specialty: Internal Medicine Contact information: [address] [place] [address] [phone]         Diamantina Monks, MD Follow up on 10/20/2022.   Specialty: Surgery Why: 7/9 at 10:30am., bring a copy of your photo ID and insurance card, arrive 30 minutes prior to your appointment Contact information: [address] [place] [address] [facility] [place] [facility] [place] [address] [phone]                 Signed: Leary Roca, [facility] Surgery 10/09/2022, 2:41 PM Please see Amion for pager number during day hours 7:00am-4:30pm

## 2022-10-20 ENCOUNTER — Other Ambulatory Visit: Payer: Self-pay | Admitting: Student

## 2022-10-20 DIAGNOSIS — R109 Unspecified abdominal pain: Secondary | ICD-10-CM

## 2022-10-22 ENCOUNTER — Other Ambulatory Visit: Payer: Self-pay | Admitting: Student

## 2022-10-22 DIAGNOSIS — R109 Unspecified abdominal pain: Secondary | ICD-10-CM

## 2022-10-23 ENCOUNTER — Ambulatory Visit
Admission: RE | Admit: 2022-10-23 | Discharge: 2022-10-23 | Disposition: A | Discharge status: Home / Self Care | Payer: BC Managed Care – PPO | Source: Ambulatory Visit | Attending: Student | Admitting: Student

## 2022-10-23 DIAGNOSIS — R109 Unspecified abdominal pain: Secondary | ICD-10-CM

## 2022-10-23 DIAGNOSIS — G8918 Other acute postprocedural pain: Secondary | ICD-10-CM

## 2022-10-23 MED ORDER — IOPAMIDOL (ISOVUE-300) INJECTION 61%
100.0000 mL | Freq: Once | INTRAVENOUS | Status: AC | PRN
  Administered 2022-10-23: 100 mL via INTRAVENOUS

## 2022-11-05 ENCOUNTER — Encounter: Payer: Self-pay | Admitting: Gastroenterology

## 2022-11-21 ENCOUNTER — Emergency Department (HOSPITAL_BASED_OUTPATIENT_CLINIC_OR_DEPARTMENT_OTHER)
Admission: EM | Admit: 2022-11-21 | Discharge: 2022-11-21 | Disposition: A | Discharge status: Home / Self Care | Payer: BC Managed Care – PPO | Attending: Emergency Medicine | Admitting: Emergency Medicine

## 2022-11-21 ENCOUNTER — Encounter (HOSPITAL_BASED_OUTPATIENT_CLINIC_OR_DEPARTMENT_OTHER): Payer: Self-pay

## 2022-11-21 ENCOUNTER — Emergency Department (HOSPITAL_BASED_OUTPATIENT_CLINIC_OR_DEPARTMENT_OTHER): Payer: BC Managed Care – PPO

## 2022-11-21 ENCOUNTER — Other Ambulatory Visit: Payer: Self-pay

## 2022-11-21 DIAGNOSIS — Z7982 Long term (current) use of aspirin: Secondary | ICD-10-CM | POA: Diagnosis not present

## 2022-11-21 DIAGNOSIS — R000 Tachycardia, unspecified: Secondary | ICD-10-CM | POA: Insufficient documentation

## 2022-11-21 DIAGNOSIS — E860 Dehydration: Secondary | ICD-10-CM | POA: Insufficient documentation

## 2022-11-21 DIAGNOSIS — R109 Unspecified abdominal pain: Secondary | ICD-10-CM | POA: Diagnosis not present

## 2022-11-21 DIAGNOSIS — Z1152 Encounter for screening for COVID-19: Secondary | ICD-10-CM | POA: Insufficient documentation

## 2022-11-21 DIAGNOSIS — R42 Dizziness and giddiness: Secondary | ICD-10-CM | POA: Diagnosis present

## 2022-11-21 DIAGNOSIS — B349 Viral infection, unspecified: Secondary | ICD-10-CM

## 2022-11-21 DIAGNOSIS — Z7984 Long term (current) use of oral hypoglycemic drugs: Secondary | ICD-10-CM | POA: Diagnosis not present

## 2022-11-21 DIAGNOSIS — Z79899 Other long term (current) drug therapy: Secondary | ICD-10-CM | POA: Insufficient documentation

## 2022-11-21 LAB — CBC
HCT: 37.9 % (ref 36.0–46.0)
Hemoglobin: 12.7 g/dL (ref 12.0–15.0)
MCH: 29.1 pg (ref 26.0–34.0)
MCHC: 33.5 g/dL (ref 30.0–36.0)
MCV: 86.9 fL (ref 80.0–100.0)
Platelets: 404 10*3/uL — ABNORMAL HIGH (ref 150–400)
RBC: 4.36 MIL/uL (ref 3.87–5.11)
RDW: 13.1 % (ref 11.5–15.5)
WBC: 10 10*3/uL (ref 4.0–10.5)
nRBC: 0 % (ref 0.0–0.2)

## 2022-11-21 LAB — COMPREHENSIVE METABOLIC PANEL
ALT: 13 U/L (ref 0–44)
AST: 19 U/L (ref 15–41)
Albumin: 3.7 g/dL (ref 3.5–5.0)
Alkaline Phosphatase: 33 U/L — ABNORMAL LOW (ref 38–126)
Anion gap: 9 (ref 5–15)
BUN: 12 mg/dL (ref 6–20)
CO2: 21 mmol/L — ABNORMAL LOW (ref 22–32)
Calcium: 8.9 mg/dL (ref 8.9–10.3)
Chloride: 107 mmol/L (ref 98–111)
Creatinine, Ser: 0.6 mg/dL (ref 0.44–1.00)
GFR, Estimated: >60 mL/min (ref >60)
Glucose, Bld: 105 mg/dL — ABNORMAL HIGH (ref 70–99)
Potassium: 4.4 mmol/L (ref 3.5–5.1)
Sodium: 137 mmol/L (ref 135–145)
Total Bilirubin: 0.2 mg/dL — ABNORMAL LOW (ref 0.3–1.2)
Total Protein: 7 g/dL (ref 6.5–8.1)

## 2022-11-21 LAB — URINALYSIS, ROUTINE W REFLEX MICROSCOPIC
Bilirubin Urine: NEGATIVE
Glucose, UA: NEGATIVE mg/dL
Hgb urine dipstick: NEGATIVE
Ketones, ur: NEGATIVE mg/dL
Leukocytes,Ua: NEGATIVE
Nitrite: NEGATIVE
Protein, ur: NEGATIVE mg/dL
Specific Gravity, Urine: 1.02 (ref 1.005–1.030)
pH: 8.5 — ABNORMAL HIGH (ref 5.0–8.0)

## 2022-11-21 LAB — LIPASE, BLOOD: Lipase: 28 U/L (ref 11–51)

## 2022-11-21 LAB — RESP PANEL BY RT-PCR (RSV, FLU A&B, COVID)  RVPGX2
Influenza A by PCR: NEGATIVE
Influenza B by PCR: NEGATIVE
Resp Syncytial Virus by PCR: NEGATIVE
SARS Coronavirus 2 by RT PCR: NEGATIVE

## 2022-11-21 LAB — PREGNANCY, URINE: Preg Test, Ur: NEGATIVE

## 2022-11-21 MED ORDER — IOHEXOL 300 MG/ML  SOLN
100.0000 mL | Freq: Once | INTRAMUSCULAR | Status: AC | PRN
  Administered 2022-11-21: 100 mL via INTRAVENOUS

## 2022-11-21 MED ORDER — ONDANSETRON 4 MG PO TBDP: ORAL_TABLET | ORAL | 0 refills | Status: AC

## 2022-11-21 MED ORDER — SODIUM CHLORIDE 0.9 % IV BOLUS
1000.0000 mL | Freq: Once | INTRAVENOUS | Status: AC
  Administered 2022-11-21: 1000 mL via INTRAVENOUS

## 2022-11-21 MED ORDER — ONDANSETRON HCL 4 MG/2ML IJ SOLN
4.0000 mg | Freq: Once | INTRAMUSCULAR | Status: AC
  Administered 2022-11-21: 4 mg via INTRAVENOUS
  Filled 2022-11-21: qty 2

## 2022-11-21 NOTE — ED Triage Notes (Signed)
Patient has been having ABD pain, Fever, dizziness and nausea for 5 days.

## 2022-11-21 NOTE — Discharge Instructions (Signed)
Please stay hydrated.  As we discussed, you likely have viral infection.  Your CT scan and labs and urinalysis were unremarkable today  I have prescribed Zofran as needed for nausea  See your doctor for follow-up  Return to ER if you have worse abdominal pain or vomiting or fever

## 2022-11-21 NOTE — ED Provider Notes (Signed)
[place] [facility] Provider Note   CSN: [id number] Arrival date & time: 11/21/22  1554     History  Chief Complaint  Patient presents with   Abdominal Pain   Dizziness    Allison Cunningham is a 42 y.o. female history of cholecystectomy who presented abdominal pain and fever and dizziness and nausea.  Patient states that she has been having intermittent fever for several days.  She states that Tmax is 101.  She had at home negative COVID test yesterday.  Patient also has some intermittent abdominal pain as well.  She states that she had previous cholecystectomy and she has some pain around that area.  Denies any nausea or vomiting or diarrhea.  She just states that she just feels weak all over.  Denies any sick contacts.  She had a telemedicine visit today and was recommended to come in the ED for further evaluation  The history is provided by the patient.       Home Medications Prior to Admission medications   Medication Sig Start Date End Date Taking? Authorizing Provider  acetaminophen (TYLENOL) 500 MG tablet Take 2 tablets (1,000 mg total) by mouth every 6 (six) hours. 10/05/22 10/05/23  Diamantina Monks, MD  albuterol (VENTOLIN HFA) 108 (90 Base) MCG/ACT inhaler Inhale 2 puffs into the lungs every 4 (four) hours as needed. Patient taking differently: Inhale 2 puffs into the lungs every 4 (four) hours as needed for shortness of breath or wheezing. 12/18/20     Armodafinil 150 MG tablet Take 1 tablet (150 mg total) by mouth daily. Max Daily Amount: 150 mg. 04/21/22     aspirin EC 81 MG tablet Take 81 mg by mouth every evening. Swallow whole.    [provider]  atomoxetine (STRATTERA) 40 MG capsule Take 1 capsule (40 mg total) by mouth daily with food. 04/07/22     atorvastatin (LIPITOR) 10 MG tablet Take 1 tablet (10 mg total) by mouth at bedtime. Patient not taking: Reported on 10/04/2022 01/12/22     busPIRone (BUSPAR) 10 MG tablet Take 10 mg  by mouth 3 (three) times daily. 04/15/18   [provider]  busPIRone (BUSPAR) 10 MG tablet Take 1 tablet (10 mg total) by mouth in the morning, at noon, and at bedtime. Patient not taking: Reported on 10/04/2022 04/07/22     clonazePAM (KLONOPIN) 0.5 MG tablet Take 1 tablet (0.5 mg total) by mouth daily. 04/07/22     diclofenac (VOLTAREN) 75 MG EC tablet Take 1 tablet (75 mg total) by mouth 2 (two) times daily. 04/18/20   Regal, Kirstie Peri, DPM  Erenumab-aooe (AIMOVIG) 140 MG/ML SOAJ Inject 140 mg as directed every 30 (thirty) days. 07/06/22     gabapentin (NEURONTIN) 300 MG capsule Take one capsule (300 mg dose) by mouth at bedtime. 01/12/22     ibuprofen (ADVIL) 600 MG tablet Take 1 tablet (600 mg total) by mouth 4 (four) times daily. 10/05/22   Diamantina Monks, MD  metFORMIN (GLUCOPHAGE-XR) 750 MG 24 hr tablet Take 1 tablet (750 mg total) by mouth daily. Patient taking differently: Take 750 mg by mouth every evening. 03/20/22     methocarbamol (ROBAXIN-750) 750 MG tablet Take 1 tablet (750 mg total) by mouth 4 (four) times daily. 10/05/22   Diamantina Monks, MD  Norgestimate-Ethinyl Estradiol Triphasic 0.18/0.215/0.25 MG-25 MCG tab TAKE 1 TABLET BY MOUTH ONCE DAILY Patient taking differently: Take 1 tablet by mouth every evening. 01/16/20 10/04/22  Kang-Oh, Leah E, DO  omeprazole (PRILOSEC) 40 MG capsule Take 1 capsule by mouth daily. 02/18/22     ondansetron (ZOFRAN-ODT) 4 MG disintegrating tablet Take 1 tablet (4 mg total) by mouth every 8 (eight) hours as needed for nausea or vomiting. 11/07/21   Elson Areas, PA-C  oxcarbazepine (TRILEPTAL) 600 MG tablet Take 1 tablet by mouth once every night at bedtime Patient not taking: Reported on 10/04/2022 02/11/21     oxyCODONE (ROXICODONE) 5 MG immediate release tablet Take 1 tablet (5 mg total) by mouth every 4 (four) hours as needed for severe pain. 10/05/22 10/05/23  Diamantina Monks, MD  paliperidone (INVEGA) 3 MG 24 hr tablet Take 1 tablet (3 mg  total) by mouth at bedtime. 03/20/22     prazosin (MINIPRESS) 5 MG capsule Take 1 capsule (5 mg total) by mouth at bedtime. Patient not taking: Reported on 10/04/2022 03/04/22     SUMAtriptan (IMITREX) 100 MG tablet Take 1 tablet by mouth at onset of headache. May repeat in 2 hours if headache pain persists. **Max of 2 tablets in 24 hours** Patient taking differently: Take 100 mg by mouth every 2 (two) hours as needed for migraine or headache. 07/29/20     topiramate (TOPAMAX) 50 MG tablet Take 3 tablets (150 mg total) by mouth at bedtime. Patient not taking: Reported on 10/04/2022 12/30/21     triamcinolone cream (KENALOG) 0.1 % Apply on to the skin 2 times daily as needed Patient taking differently: Apply 1 Application topically 2 (two) times daily as needed (itching). 02/09/22   Margaretann Loveless, PA-C  valbenazine [facility]) 80 MG capsule Take one capsule by mouth daily Patient not taking: Reported on 10/04/2022 04/25/21         Allergies    Lamotrigine, Cephalexin, Clarithromycin, Amoxicillin-pot clavulanate, and Penicillins    Review of Systems   Review of Systems  Constitutional:  Positive for fever.  Gastrointestinal:  Positive for abdominal pain.  Neurological:  Positive for dizziness.  All other systems reviewed and are negative.   Physical Exam Updated Vital Signs BP 123/84 (BP Location: Left Arm)   Pulse (!) 103   Temp 98.2 F (36.8 C) (Oral)   Resp 18   Ht 5\' 4"  (1.626 m)   Wt 79.8 kg   LMP 11/01/2022 (Approximate)   SpO2 98%   BMI 30.21 kg/m  Physical Exam Vitals and nursing note reviewed.  Constitutional:      Comments: Slightly dehydrated  HENT:     Head: Normocephalic.     Mouth/Throat:     Pharynx: Oropharynx is clear.  Eyes:     Extraocular Movements: Extraocular movements intact.  Cardiovascular:     Rate and Rhythm: Normal rate and regular rhythm.  Pulmonary:     Effort: Pulmonary effort is normal.     Breath sounds: Normal breath sounds.   Abdominal:     General: Abdomen is flat.     Comments: Mild right-sided abdominal tenderness.  Skin:    General: Skin is warm.     Capillary Refill: Capillary refill takes less than 2 seconds.  Neurological:     General: No focal deficit present.     Mental Status: She is alert and oriented to person, place, and time.  Psychiatric:        Mood and Affect: Mood normal.        Behavior: Behavior normal.     ED Results / Procedures / Treatments   Labs (all labs ordered  are listed, but only abnormal results are displayed) Labs Reviewed  COMPREHENSIVE METABOLIC PANEL - Abnormal; Notable for the following components:      Result Value   CO2 21 (*)    Glucose, Bld 105 (*)    Alkaline Phosphatase 33 (*)    Total Bilirubin 0.2 (*)    All other components within normal limits  CBC - Abnormal; Notable for the following components:   Platelets 404 (*)    All other components within normal limits  URINALYSIS, ROUTINE W REFLEX MICROSCOPIC - Abnormal; Notable for the following components:   pH 8.5 (*)    All other components within normal limits  RESP PANEL BY RT-PCR (RSV, FLU A&B, COVID)  RVPGX2  LIPASE, BLOOD  PREGNANCY, URINE    EKG EKG Interpretation Date/Time:  Saturday November 21 2022 16:04:39 EDT Ventricular Rate:  102 PR Interval:  152 QRS Duration:  95 QT Interval:  331 QTC Calculation: 432 R Axis:   96  Text Interpretation: Sinus tachycardia Low voltage with right axis deviation No significant change since last tracing Confirmed by Richardean Canal [phone]) on 11/21/2022 5:21:58 PM  Radiology No results found.  Procedures Procedures    Medications Ordered in ED Medications  iohexol (OMNIPAQUE) 300 MG/ML solution 100 mL (has no administration in time range)  sodium chloride 0.9 % bolus 1,000 mL (1,000 mLs Intravenous New Bag/Given 11/21/22 1735)  ondansetron (ZOFRAN) injection 4 mg (4 mg Intravenous Given 11/21/22 1735)    ED Course/ Medical Decision Making/ A&P                                  Medical Decision Making Allison Cunningham is a 42 y.o. female here presenting with fever and right-sided abdominal pain.  Consider biliary colic versus viral syndrome versus pyelonephritis versus appendicitis.  Plan to get CBC and CMP and UA and CT abdomen pelvis.  7:47 PM I reviewed patient's labs and white blood cell count is normal.  Chemistry unremarkable and UA is normal.  Patient's COVID and flu and RSV are negative.  CT abdomen pelvis unremarkable.  At this point patient likely have viral syndrome.  Patient was hydrated and felt better.  Stable for discharge.  Problems Addressed: Viral syndrome: acute illness or injury  Amount and/or Complexity of Data Reviewed Labs: ordered. Decision-making details documented in ED Course. Radiology: ordered and independent interpretation performed. Decision-making details documented in ED Course.  Risk Prescription drug management.   Final Clinical Impression(s) / ED Diagnoses Final diagnoses:  None    Rx / DC Orders ED Discharge Orders     None         Charlynne Pander, MD 11/21/22 [phone]

## 2023-02-01 IMAGING — CT CT ABD-PELV W/ CM
2 of 4 series · 16 of 46 positions shown, 18 images · IV contrast (Omnipaque)
Comparison: Abdominal ultrasound 10/02/2011; CT abdomen and pelvis
03/29/2009

CLINICAL DATA: Right lower quadrant abdominal pain. Suspect
appendicitis.

EXAM:
CT ABDOMEN AND PELVIS WITH CONTRAST
TECHNIQUE: Multidetector CT imaging of the abdomen and pelvis was performed
using the standard protocol following bolus administration of
intravenous contrast.

[Series 2: axial st · axial · 0.97mm/px · z∈[-648,-193]mm · 13 of 101 slices shown, 15 images]
[im 5/101  soft-tissue]
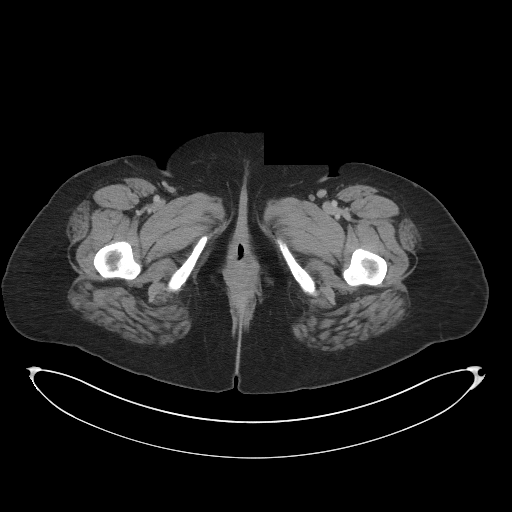
[im 5/101  bone]
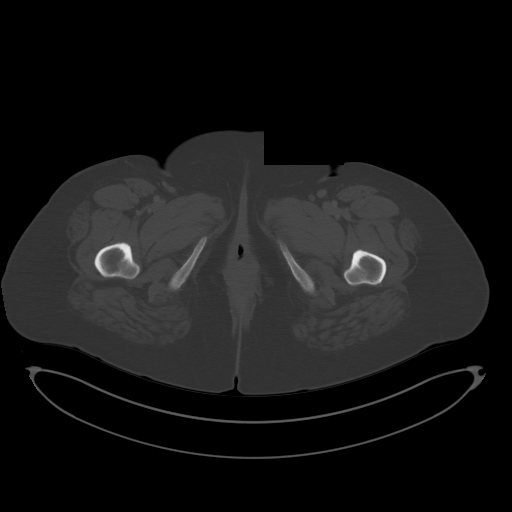
[im 14/101  soft-tissue]
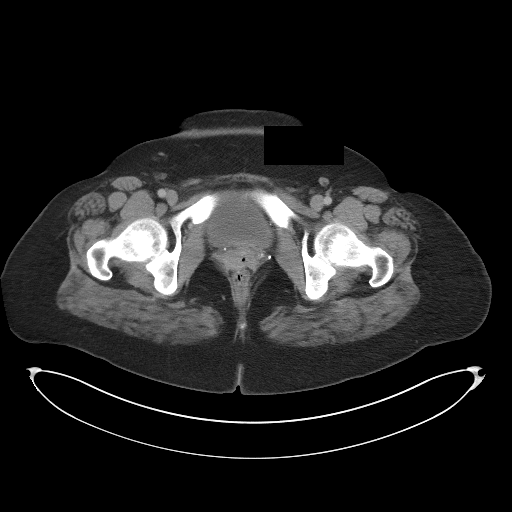
[im 22/101  soft-tissue]
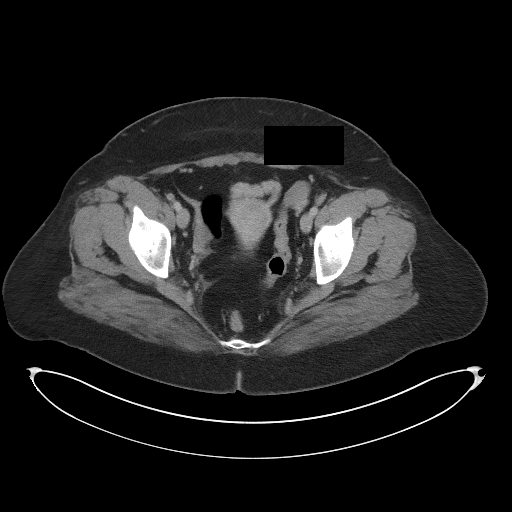
[im 27/101  soft-tissue]
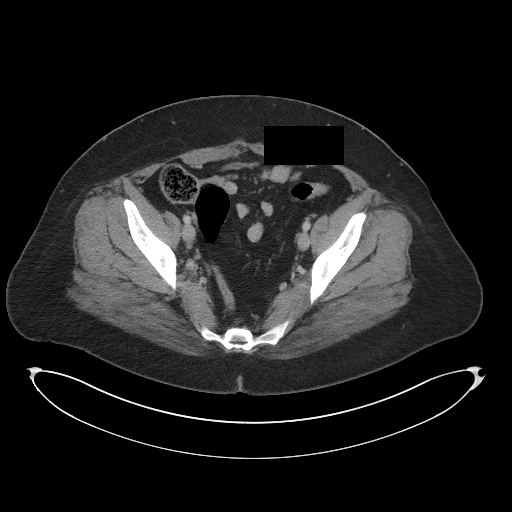
[im 35/101  soft-tissue]
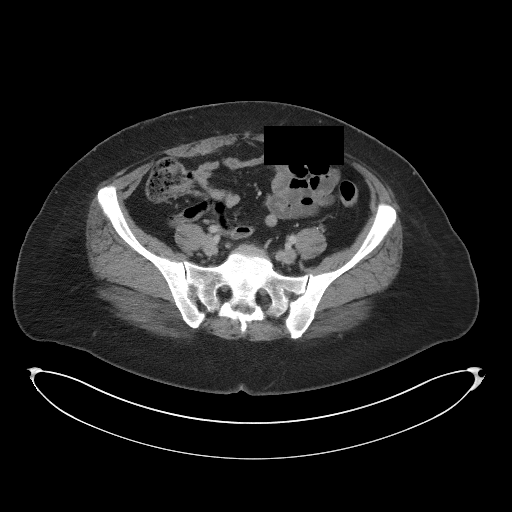
[im 44/101  soft-tissue]
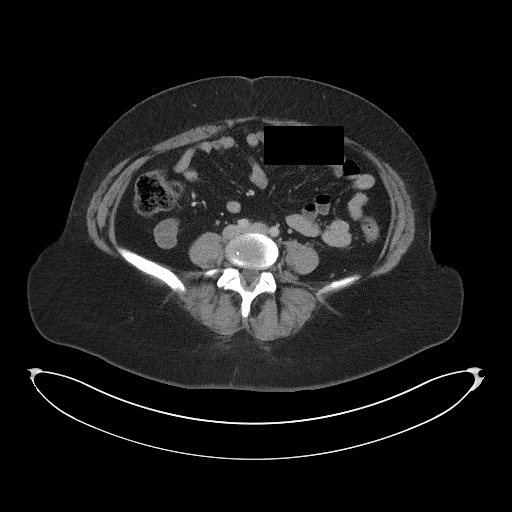
[im 53/101  soft-tissue]
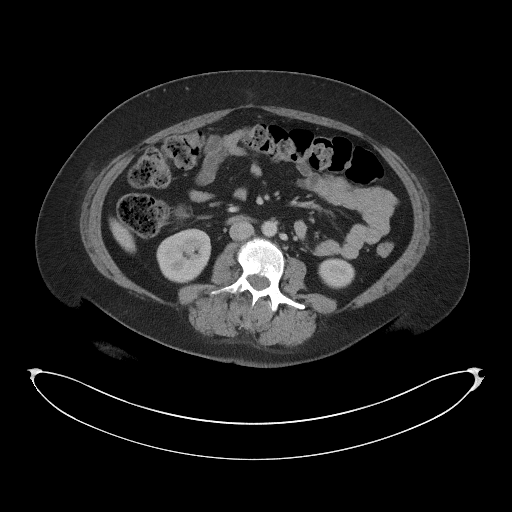
[im 57/101  soft-tissue]
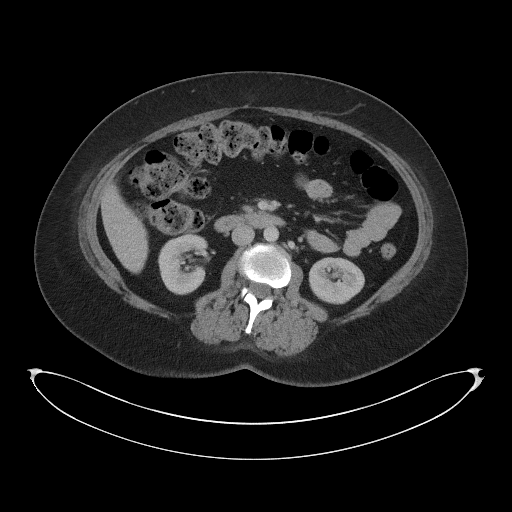
[im 66/101  soft-tissue]
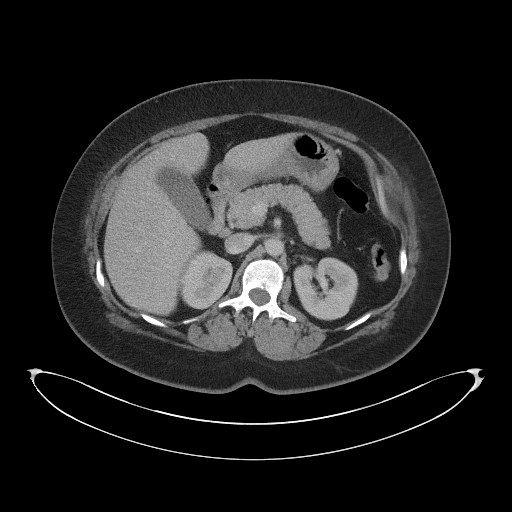
[im 66/101  bone]
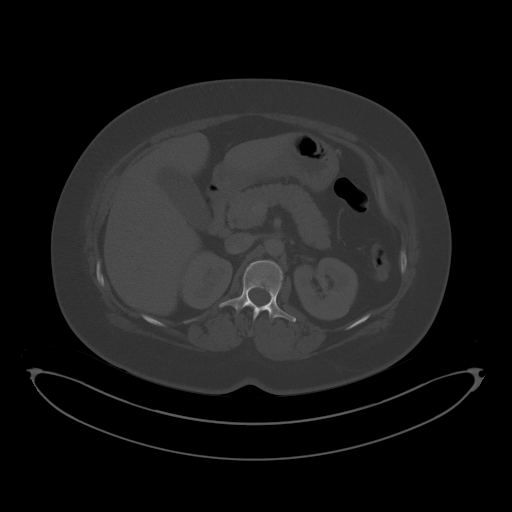
[im 74/101  soft-tissue]
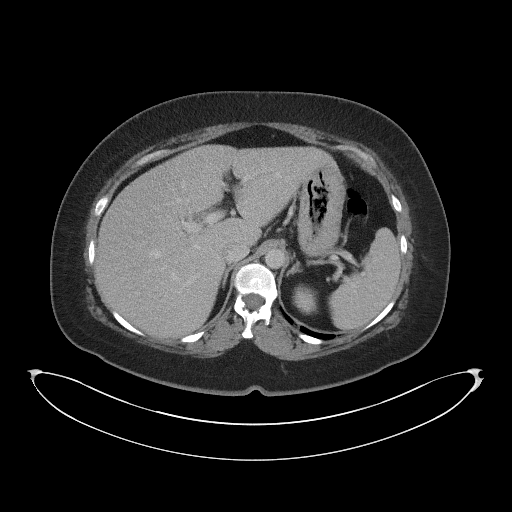
[im 79/101  soft-tissue]
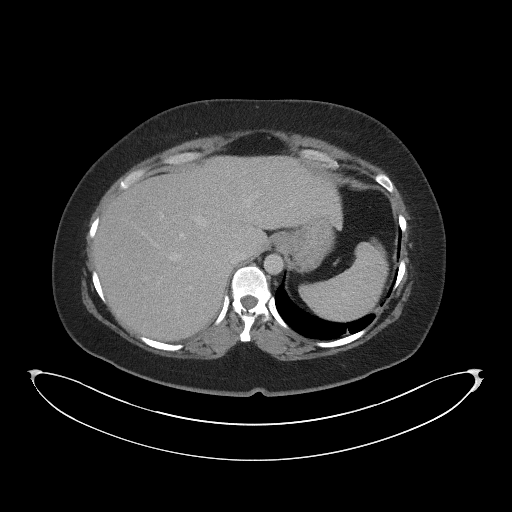
[im 87/101  soft-tissue]
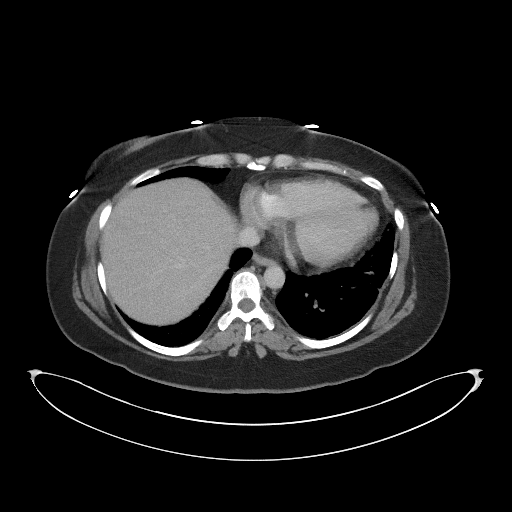
[im 96/101  soft-tissue]
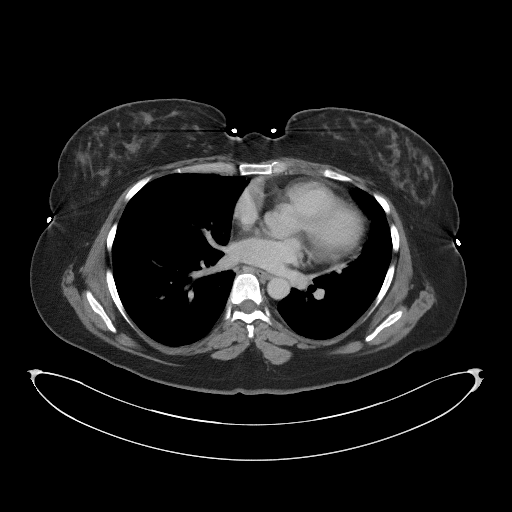

[Series 5: coronal st · coronal · 1.02mm/px · 3 of 125 slices shown]
[im 42/125  soft-tissue]
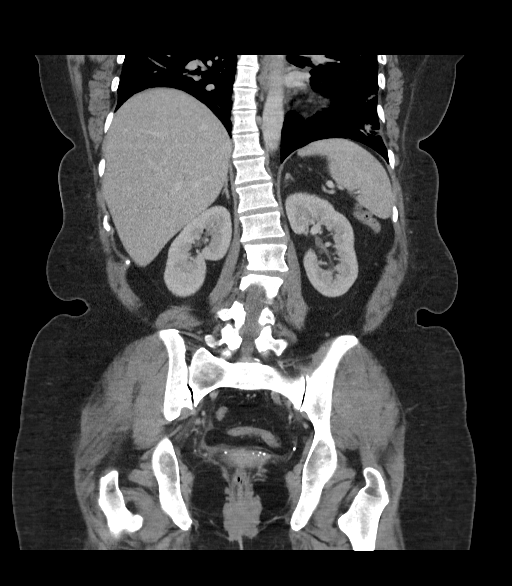
[im 56/125  soft-tissue]
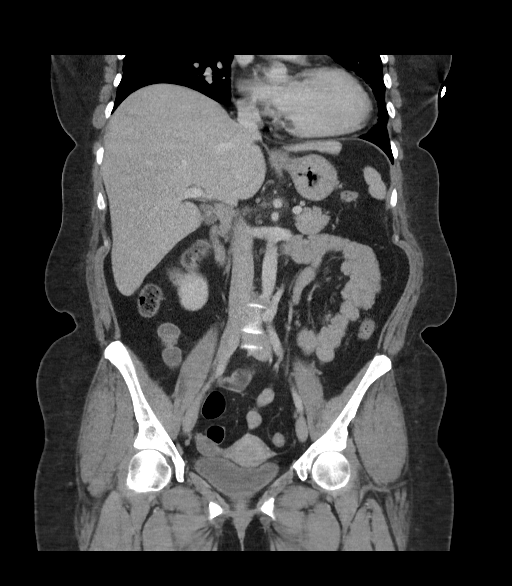
[im 69/125  soft-tissue]
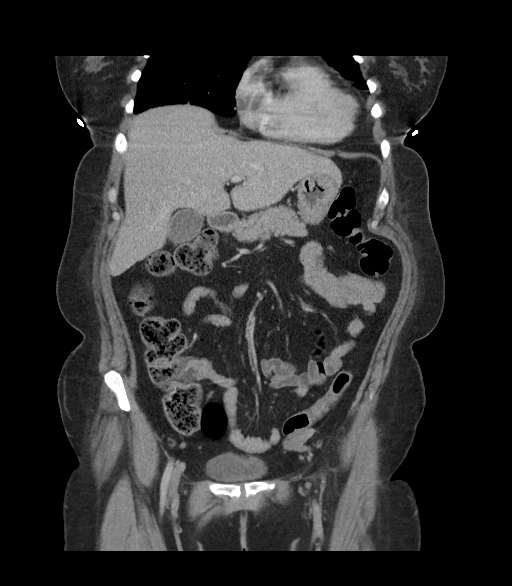

[16 of 46 positions shown; findings below may reference images not displayed]

RADIATION DOSE REDUCTION: This exam was performed according to the
departmental dose-optimization program which includes automated
exposure control, adjustment of the mA and/or kV according to
patient size and/or use of iterative reconstruction technique.

CONTRAST:  100mL OMNIPAQUE IOHEXOL 300 MG/ML  SOLN
FINDINGS: Lower chest: Mild curvilinear subsegmental atelectasis within the
bilateral lower lobes. No pleural effusion. Heart size is mildly
enlarged. No pericardial effusion.

Hepatobiliary: Smooth liver contours. No focal liver mass is
identified. The gallbladder is unremarkable. No intrahepatic or
extrahepatic biliary ductal dilatation.

Pancreas: No mass or inflammatory fat stranding. No pancreatic
ductal dilatation is seen.

Spleen: Normal in size without focal abnormality.

Adrenals/Urinary Tract: Adrenal glands are unremarkable. The kidneys
enhance uniformly and are symmetric in size without hydronephrosis.
No renal stone is seen. No renal mass is seen. The urinary bladder
is only mildly distended limiting evaluation.

Stomach/Bowel: No focal bowel wall thickening. The terminal ileum is
unremarkable. Normal appendix (axial series 2 images 72 through 77
and coronal images 62 through 67). No dilated loops of bowel to
indicate bowel obstruction.

Vascular/Lymphatic: No abdominal aortic aneurysm. No mesenteric,
retroperitoneal, or pelvic lymphadenopathy.

Reproductive: The uterus is anteverted. No gross adnexal
abnormality.

Other: Tiny fat containing umbilical hernia.No abdominopelvic
ascites. No pneumoperitoneum.

Musculoskeletal: Mild levocurvature centered at L2. Mild right L1-2
disc space narrowing. Normal sagittal alignment. Vertebral body
heights are maintained.
IMPRESSION: :
IMPRESSION: 1. Normal appendix.
2. No CT explanation for the patient's right lower quadrant
abdominal pain.

## 2023-02-02 ENCOUNTER — Ambulatory Visit: Payer: BC Managed Care – PPO | Admitting: Gastroenterology

## 2023-02-02 NOTE — Progress Notes (Deleted)
HPI :     CT Abdomen/Pelvis Nov 21, 2022 IMPRESSION: 1. No acute intra-abdominal or pelvic pathology. 2. Punctate nonobstructing right renal inferior pole calculus. No hydronephrosis. 3. No bowel obstruction. Normal appendix.   CT Abdomen/Pelvis October 23, 2022 IMPRESSION: 1. No acute abnormality in the abdomen or pelvis. 2. Moderate volume of formed stool in the colon. 3. 9 mm fluid focus in the anterior vagina adjacent to the urethra, nonspecific but could reflect a urethral diverticulum. Correlate with physical exam   RUQUS October 04, 2022 IMPRESSION: 1. Multiple shadowing gallstones filling the gallbladder lumen, with borderline gallbladder wall thickening, trace pericholecystic fluid, and positive sonographic Murphy sign consistent with acute cholecystitis.  CT Abdomen/Pelvis Aug 20, 2021 IMPRESSION: 1. Normal appendix. 2. No CT explanation for the patient's right lower quadrant abdominal pain  Past Medical History:  Diagnosis Date   Anxiety    Dizziness    Palpitations    Pre-syncope    Syncope and collapse    has no happened in last year--last 2013     Past Surgical History:  Procedure Laterality Date   CESAREAN SECTION     x2   CHOLECYSTECTOMY N/A 10/05/2022   Procedure: LAPAROSCOPIC CHOLECYSTECTOMY;  Surgeon: Diamantina Monks, MD;  Location: [facility];  Service: General;  Laterality: N/A;   Family History  Problem Relation Age of Onset   Heart attack Mother    Hypertension Mother    Heart murmur Mother    Heart disease Father    Cirrhosis Father    Hypertension Father    Heart attack Maternal Grandmother    Cervical cancer Maternal Grandmother    Stroke Maternal Grandmother    Hypertension Maternal Grandmother    Hypertension Maternal Uncle    Stroke Maternal Grandfather    Social History   Tobacco Use   Smoking status: Every Day    Current packs/day: 1.00    Average packs/day: 1 pack/day for 17.0 years (17.0 ttl pk-yrs)    Types:  Cigarettes   Smokeless tobacco: Never  Vaping Use   Vaping status: Never Used  Substance Use Topics   Alcohol use: No   Drug use: No   Current Outpatient Medications  Medication Sig Dispense Refill   acetaminophen (TYLENOL) 500 MG tablet Take 2 tablets (1,000 mg total) by mouth every 6 (six) hours. 120 tablet 3   albuterol (VENTOLIN HFA) 108 (90 Base) MCG/ACT inhaler Inhale 2 puffs into the lungs every 4 (four) hours as needed. (Patient taking differently: Inhale 2 puffs into the lungs every 4 (four) hours as needed for shortness of breath or wheezing.) 8.5 g 0   Armodafinil 150 MG tablet Take 1 tablet (150 mg total) by mouth daily. Max Daily Amount: 150 mg. 30 tablet 0   aspirin EC 81 MG tablet Take 81 mg by mouth every evening. Swallow whole.     atomoxetine (STRATTERA) 40 MG capsule Take 1 capsule (40 mg total) by mouth daily with food. 30 capsule 1   atorvastatin (LIPITOR) 10 MG tablet Take 1 tablet (10 mg total) by mouth at bedtime. (Patient not taking: Reported on 10/04/2022) 90 tablet 1   busPIRone (BUSPAR) 10 MG tablet Take 10 mg by mouth 3 (three) times daily.     busPIRone (BUSPAR) 10 MG tablet Take 1 tablet (10 mg total) by mouth in the morning, at noon, and at bedtime. (Patient not taking: Reported on 10/04/2022) 270 tablet 0   clonazePAM (KLONOPIN) 0.5 MG tablet Take 1 tablet (0.5 mg  total) by mouth daily. 30 tablet 2   diclofenac (VOLTAREN) 75 MG EC tablet Take 1 tablet (75 mg total) by mouth 2 (two) times daily. 50 tablet 2   Erenumab-aooe (AIMOVIG) 140 MG/ML SOAJ Inject 140 mg as directed every 30 (thirty) days. 1 mL 11   gabapentin (NEURONTIN) 300 MG capsule Take one capsule (300 mg dose) by mouth at bedtime. 90 capsule 0   ibuprofen (ADVIL) 600 MG tablet Take 1 tablet (600 mg total) by mouth 4 (four) times daily. 120 tablet 01   metFORMIN (GLUCOPHAGE-XR) 750 MG 24 hr tablet Take 1 tablet (750 mg total) by mouth daily. (Patient taking differently: Take 750 mg by mouth every  evening.) 30 tablet 2   methocarbamol (ROBAXIN-750) 750 MG tablet Take 1 tablet (750 mg total) by mouth 4 (four) times daily. 120 tablet 1   Norgestimate-Ethinyl Estradiol Triphasic 0.18/0.215/0.25 MG-25 MCG tab TAKE 1 TABLET BY MOUTH ONCE DAILY (Patient taking differently: Take 1 tablet by mouth every evening.) 28 tablet 6   omeprazole (PRILOSEC) 40 MG capsule Take 1 capsule by mouth daily. 90 capsule 1   ondansetron (ZOFRAN-ODT) 4 MG disintegrating tablet 4mg  ODT q4 hours prn nausea/vomit 10 tablet 0   oxcarbazepine (TRILEPTAL) 600 MG tablet Take 1 tablet by mouth once every night at bedtime (Patient not taking: Reported on 10/04/2022) 90 tablet 0   oxyCODONE (ROXICODONE) 5 MG immediate release tablet Take 1 tablet (5 mg total) by mouth every 4 (four) hours as needed for severe pain. 15 tablet 0   paliperidone (INVEGA) 3 MG 24 hr tablet Take 1 tablet (3 mg total) by mouth at bedtime. 30 tablet 1   prazosin (MINIPRESS) 5 MG capsule Take 1 capsule (5 mg total) by mouth at bedtime. (Patient not taking: Reported on 10/04/2022) 90 capsule 0   SUMAtriptan (IMITREX) 100 MG tablet Take 1 tablet by mouth at onset of headache. May repeat in 2 hours if headache pain persists. **Max of 2 tablets in 24 hours** (Patient taking differently: Take 100 mg by mouth every 2 (two) hours as needed for migraine or headache.) 12 tablet 5   topiramate (TOPAMAX) 50 MG tablet Take 3 tablets (150 mg total) by mouth at bedtime. (Patient not taking: Reported on 10/04/2022) 270 tablet 3   triamcinolone cream (KENALOG) 0.1 % Apply on to the skin 2 times daily as needed (Patient taking differently: Apply 1 Application topically 2 (two) times daily as needed (itching).) 90 g 0   valbenazine (INGREZZA) 80 MG capsule Take one capsule by mouth daily (Patient not taking: Reported on 10/04/2022) 90 capsule 3   No current facility-administered medications for this visit.   Allergies  Allergen Reactions   Lamotrigine Rash   Cephalexin  Nausea And Vomiting   Clarithromycin Hives   Amoxicillin-Pot Clavulanate Rash   Penicillins Rash     Review of Systems: All systems reviewed and negative except where noted in HPI.    No results found.  Physical Exam: There were no vitals taken for this visit. Constitutional: Pleasant,well-developed, ***female in no acute distress. HEENT: Normocephalic and atraumatic. Conjunctivae are normal. No scleral icterus. Neck supple.  Cardiovascular: Normal rate, regular rhythm.  Pulmonary/chest: Effort normal and breath sounds normal. No wheezing, rales or rhonchi. Abdominal: Soft, nondistended, nontender. Bowel sounds active throughout. There are no masses palpable. No hepatomegaly. Extremities: no edema Lymphadenopathy: No cervical adenopathy noted. Neurological: Alert and oriented to person place and time. Skin: Skin is warm and dry. No rashes noted. Psychiatric: Normal mood  and affect. Behavior is normal.  CBC    Component Value Date/Time   WBC 10.0 11/21/2022 1601   RBC 4.36 11/21/2022 1601   HGB 12.7 11/21/2022 1601   HCT 37.9 11/21/2022 1601   PLT 404 (H) 11/21/2022 1601   MCV 86.9 11/21/2022 1601   MCH 29.1 11/21/2022 1601   MCHC 33.5 11/21/2022 1601   RDW 13.1 11/21/2022 1601   LYMPHSABS 2.5 10/04/2022 1332   MONOABS 1.4 (H) 10/04/2022 1332   EOSABS 0.8 (H) 10/04/2022 1332   BASOSABS 0.1 10/04/2022 1332    CMP     Component Value Date/Time   NA 137 11/21/2022 1601   K 4.4 11/21/2022 1601   CL 107 11/21/2022 1601   CO2 21 (L) 11/21/2022 1601   GLUCOSE 105 (H) 11/21/2022 1601   BUN 12 11/21/2022 1601   CREATININE 0.60 11/21/2022 1601   CREATININE 0.60 06/14/2015 0815   CALCIUM 8.9 11/21/2022 1601   PROT 7.0 11/21/2022 1601   ALBUMIN 3.7 11/21/2022 1601   AST 19 11/21/2022 1601   ALT 13 11/21/2022 1601   ALKPHOS 33 (L) 11/21/2022 1601   BILITOT 0.2 (L) 11/21/2022 1601   GFRNONAA >60 11/21/2022 1601   GFRAA >90 10/02/2011 1440       Latest Ref Rng &  Units 11/21/2022    4:01 PM 10/05/2022    4:11 AM 10/04/2022    1:32 PM  CBC EXTENDED  WBC 4.0 - 10.5 K/uL 10.0  10.1  23.8    24.0   RBC 3.87 - 5.11 MIL/uL 4.36  4.04  4.62    4.57   Hemoglobin 12.0 - 15.0 g/dL 16.1  09.6  04.5    40.9   HCT 36.0 - 46.0 % 37.9  36.1  40.0    39.7   Platelets 150 - 400 K/uL 404  335  384    389   NEUT# 1.7 - 7.7 K/uL   19.1   Lymph# 0.7 - 4.0 K/uL   2.5       ASSESSMENT AND PLAN:  Maczis, Hedda Slade, PA-C

## 2023-04-13 ENCOUNTER — Ambulatory Visit
Admission: EM | Admit: 2023-04-13 | Discharge: 2023-04-13 | Disposition: A | Discharge status: Home / Self Care | Payer: BC Managed Care – PPO | Attending: Family Medicine | Admitting: Family Medicine

## 2023-04-13 DIAGNOSIS — J020 Streptococcal pharyngitis: Secondary | ICD-10-CM

## 2023-04-13 DIAGNOSIS — J039 Acute tonsillitis, unspecified: Secondary | ICD-10-CM

## 2023-04-13 LAB — POCT RAPID STREP A (OFFICE): Rapid Strep A Screen: POSITIVE — AB

## 2023-04-13 MED ORDER — CEFDINIR 300 MG PO CAPS: 300.0000 mg | ORAL_CAPSULE | Freq: Two times a day (BID) | ORAL | 0 refills | Status: AC

## 2023-04-13 MED ORDER — KETOROLAC TROMETHAMINE 30 MG/ML IJ SOLN
30.0000 mg | Freq: Once | INTRAMUSCULAR | Status: AC
  Administered 2023-04-13: 30 mg via INTRAMUSCULAR

## 2023-04-13 MED ORDER — PREDNISOLONE SODIUM PHOSPHATE 15 MG/5ML PO SOLN
30.0000 mg | Freq: Every day | ORAL | Status: DC
  Administered 2023-04-13: 30 mg via ORAL

## 2023-04-13 NOTE — ED Provider Notes (Signed)
 [facility]    CSN: [id number] Arrival date & time: 04/13/23  9170      History   Chief Complaint Chief Complaint  Patient presents with   Sore Throat    HPI Allison Cunningham is a 42 y.o. female.   Severe sore throat.  Severe headache.  Could hardly speak or swallow.  Very tired.  No sweats chills or fever.  No known exposure to strep.    Past Medical History:  Diagnosis Date   Anxiety    Dizziness    Palpitations    Pre-syncope    Syncope and collapse    has no happened in last year--last 2013    Patient Active Problem List   Diagnosis Date Noted   Cholecystitis with cholelithiasis 10/04/2022   Right shoulder injury 08/14/2019   Obesity (BMI 35.0-39.9 without comorbidity) 06/08/2016   Panic disorder 06/06/2015   Schizoaffective disorder, bipolar type (HCC) 06/06/2015   Fibroadenoma of left breast 04/17/2014   Other hyperlipidemia 04/18/2013   Chest pain 04/11/2013    Past Surgical History:  Procedure Laterality Date   CESAREAN SECTION     x2   CHOLECYSTECTOMY N/A 10/05/2022   Procedure: LAPAROSCOPIC CHOLECYSTECTOMY;  Surgeon: Paola Dreama SAILOR, MD;  Location: [facility];  Service: General;  Laterality: N/A;    OB History     Gravida  3   Para  2   Term  2   Preterm      AB      Living  1      SAB      IAB      Ectopic      Multiple      Live Births  2            Home Medications    Prior to Admission medications   Medication Sig Start Date End Date Taking? Authorizing Provider  cefdinir  (OMNICEF ) 300 MG capsule Take 1 capsule (300 mg total) by mouth 2 (two) times daily. 04/13/23  Yes Maranda Jamee Jacob, MD  acetaminophen  (TYLENOL ) 500 MG tablet Take 2 tablets (1,000 mg total) by mouth every 6 (six) hours. 10/05/22 10/05/23  Paola Dreama SAILOR, MD  albuterol  (VENTOLIN  HFA) 108 (90 Base) MCG/ACT inhaler Inhale 2 puffs into the lungs every 4 (four) hours as needed. Patient taking differently: Inhale 2 puffs into the lungs every  4 (four) hours as needed for shortness of breath or wheezing. 12/18/20     Armodafinil  150 MG tablet Take 1 tablet (150 mg total) by mouth daily. Max Daily Amount: 150 mg. 04/21/22     aspirin EC 81 MG tablet Take 81 mg by mouth every evening. Swallow whole.    [provider]  atomoxetine  (STRATTERA ) 40 MG capsule Take 1 capsule (40 mg total) by mouth daily with food. 04/07/22     atorvastatin  (LIPITOR) 10 MG tablet Take 1 tablet (10 mg total) by mouth at bedtime. Patient not taking: Reported on 10/04/2022 01/12/22     busPIRone  (BUSPAR ) 10 MG tablet Take 10 mg by mouth 3 (three) times daily. 04/15/18   [provider]  busPIRone  (BUSPAR ) 10 MG tablet Take 1 tablet (10 mg total) by mouth in the morning, at noon, and at bedtime. Patient not taking: Reported on 10/04/2022 04/07/22     clonazePAM  (KLONOPIN ) 0.5 MG tablet Take 1 tablet (0.5 mg total) by mouth daily. 04/07/22     diclofenac  (VOLTAREN ) 75 MG EC tablet Take 1 tablet (75 mg total) by  mouth 2 (two) times daily. 04/18/20   Magdalen Pasco RAMAN, DPM  Erenumab -aooe (AIMOVIG ) 140 MG/ML SOAJ Inject 140 mg as directed every 30 (thirty) days. 07/06/22     gabapentin  (NEURONTIN ) 300 MG capsule Take one capsule (300 mg dose) by mouth at bedtime. 01/12/22     ibuprofen  (ADVIL ) 600 MG tablet Take 1 tablet (600 mg total) by mouth 4 (four) times daily. 10/05/22   Paola Dreama SAILOR, MD  metFORMIN  (GLUCOPHAGE -XR) 750 MG 24 hr tablet Take 1 tablet (750 mg total) by mouth daily. Patient taking differently: Take 750 mg by mouth every evening. 03/20/22     Norgestimate-Ethinyl Estradiol Triphasic 0.18/0.215/0.25 MG-25 MCG tab TAKE 1 TABLET BY MOUTH ONCE DAILY Patient taking differently: Take 1 tablet by mouth every evening. 01/16/20 10/04/22  Kang-Oh, Rea E, DO  omeprazole  (PRILOSEC) 40 MG capsule Take 1 capsule by mouth daily. 02/18/22     ondansetron  (ZOFRAN -ODT) 4 MG disintegrating tablet 4mg  ODT q4 hours prn nausea/vomit 11/21/22   Patt Alm Macho, MD   oxcarbazepine  (TRILEPTAL ) 600 MG tablet Take 1 tablet by mouth once every night at bedtime Patient not taking: Reported on 10/04/2022 02/11/21     paliperidone  (INVEGA ) 3 MG 24 hr tablet Take 1 tablet (3 mg total) by mouth at bedtime. 03/20/22     prazosin  (MINIPRESS ) 5 MG capsule Take 1 capsule (5 mg total) by mouth at bedtime. Patient not taking: Reported on 10/04/2022 03/04/22     SUMAtriptan  (IMITREX ) 100 MG tablet Take 1 tablet by mouth at onset of headache. May repeat in 2 hours if headache pain persists. **Max of 2 tablets in 24 hours** Patient taking differently: Take 100 mg by mouth every 2 (two) hours as needed for migraine or headache. 07/29/20     topiramate  (TOPAMAX ) 50 MG tablet Take 3 tablets (150 mg total) by mouth at bedtime. Patient not taking: Reported on 10/04/2022 12/30/21     triamcinolone  cream (KENALOG ) 0.1 % Apply on to the skin 2 times daily as needed Patient taking differently: Apply 1 Application topically 2 (two) times daily as needed (itching). 02/09/22   Vivienne Delon HERO, PA-C  valbenazine  (INGREZZA ) 80 MG capsule Take one capsule by mouth daily Patient not taking: Reported on 10/04/2022 04/25/21       Family History Family History  Problem Relation Age of Onset   Heart attack Mother    Hypertension Mother    Heart murmur Mother    Heart disease Father    Cirrhosis Father    Hypertension Father    Heart attack Maternal Grandmother    Cervical cancer Maternal Grandmother    Stroke Maternal Grandmother    Hypertension Maternal Grandmother    Hypertension Maternal Uncle    Stroke Maternal Grandfather     Social History Social History   Tobacco Use   Smoking status: Every Day    Current packs/day: 1.00    Average packs/day: 1 pack/day for 17.0 years (17.0 ttl pk-yrs)    Types: Cigarettes   Smokeless tobacco: Never  Vaping Use   Vaping status: Never Used  Substance Use Topics   Alcohol use: No   Drug use: No     Allergies   Lamotrigine,  Cephalexin , Clarithromycin, Amoxicillin-pot clavulanate, and Penicillins   Review of Systems Review of Systems See HPI  Physical Exam Triage Vital Signs ED Triage Vitals  Encounter Vitals Group     BP 04/13/23 0837 (!) 93/54     Systolic BP Percentile --  Diastolic BP Percentile --      Pulse Rate 04/13/23 0837 93     Resp 04/13/23 0837 16     Temp 04/13/23 0837 98.6 F (37 C)     Temp src --      SpO2 04/13/23 0837 98 %     Weight --      Height --      Head Circumference --      Peak Flow --      Pain Score 04/13/23 0835 5     Pain Loc --      Pain Education --      Exclude from Growth Chart --    No data found.  Updated Vital Signs BP (!) 93/54   Pulse 93   Temp 98.6 F (37 C)   Resp 16   LMP 03/30/2023 (Approximate)   SpO2 98%       Physical Exam Constitutional:      General: She is not in acute distress.    Appearance: She is well-developed. She is ill-appearing.  HENT:     Head: Normocephalic and atraumatic.     Right Ear: Tympanic membrane and ear canal normal.     Left Ear: Tympanic membrane and ear canal normal.     Nose: No congestion.     Mouth/Throat:     Pharynx: Uvula midline. Pharyngeal swelling, posterior oropharyngeal erythema and uvula swelling present.     Tonsils: Tonsillar exudate present. 3+ on the right. 3+ on the left.  Eyes:     Conjunctiva/sclera: Conjunctivae normal.     Pupils: Pupils are equal, round, and reactive to light.  Cardiovascular:     Rate and Rhythm: Normal rate.  Pulmonary:     Effort: Pulmonary effort is normal. No respiratory distress.  Abdominal:     General: There is no distension.     Palpations: Abdomen is soft.  Musculoskeletal:        General: Normal range of motion.     Cervical back: Normal range of motion.  Lymphadenopathy:     Cervical: Cervical adenopathy present.  Skin:    General: Skin is warm and dry.  Neurological:     Mental Status: She is alert.      UC Treatments / Results   Labs (all labs ordered are listed, but only abnormal results are displayed) Labs Reviewed  POCT RAPID STREP A (OFFICE) - Abnormal; Notable for the following components:      Result Value   Rapid Strep A Screen Positive (*)    All other components within normal limits    EKG   Radiology No results found.  Procedures Procedures (including critical care time)  Medications Ordered in UC Medications  prednisoLONE  (ORAPRED ) 15 MG/5ML solution 30 mg (30 mg Oral Given 04/13/23 0909)  ketorolac  (TORADOL ) 30 MG/ML injection 30 mg (30 mg Intramuscular Given 04/13/23 0909)    Initial Impression / Assessment and Plan / UC Course  I have reviewed the triage vital signs and the nursing notes.  Pertinent labs & imaging results that were available during my care of the patient were reviewed by me and considered in my medical decision making (see chart for details).     Patient has an acute tonsillitis.  Could hardly speak.  Can hardly open her mouth.  Muffled voice.  She is given Toradol  for the pain.  Steroids to take down the tonsillar pain and swelling.  Admonished she must take 10 days of antibiotics.  May  need ER visit if she remains unable to swallow or cannot keep down antibiotics Final Clinical Impressions(s) / UC Diagnoses   Final diagnoses:  Strep throat  Tonsillitis     Discharge Instructions      Take the antibiotic 2 times a day for 10 full days Do not stop antibiotic early.  If you have difficulty please give us  a call Take Tylenol  or ibuprofen  for pain and fever May continue with your sore throat drops or lozenges    ED Prescriptions     Medication Sig Dispense Auth. Provider   cefdinir  (OMNICEF ) 300 MG capsule Take 1 capsule (300 mg total) by mouth 2 (two) times daily. 20 capsule Maranda Jamee Jacob, MD      PDMP not reviewed this encounter.   Maranda Jamee Jacob, MD 04/13/23 [phone]

## 2023-04-13 NOTE — ED Triage Notes (Signed)
Pt presents to [facility] with co of sore throat, ha, and fatigue since Sunday. Pt has been taking cold and flu otc medication and nyquill and dayquill.

## 2023-04-13 NOTE — Discharge Instructions (Signed)
Take the antibiotic 2 times a day for 10 full days Do not stop antibiotic early.  If you have difficulty please give [place] a call Take Tylenol or ibuprofen for pain and fever May continue with your sore throat drops or lozenges

## 2023-11-01 ENCOUNTER — Telehealth: Payer: Self-pay | Admitting: Podiatry

## 2023-11-01 NOTE — Telephone Encounter (Signed)
 Called the patient to verify insurance information, including her relationship to the subscriber and other related details. The patient did not answer. A voicemail was left requesting a call back.

## 2023-11-05 ENCOUNTER — Ambulatory Visit (INDEPENDENT_AMBULATORY_CARE_PROVIDER_SITE_OTHER): Admitting: Podiatry

## 2023-11-05 ENCOUNTER — Encounter: Payer: Self-pay | Admitting: Podiatry

## 2023-11-05 ENCOUNTER — Telehealth: Payer: Self-pay | Admitting: Podiatry

## 2023-11-05 ENCOUNTER — Ambulatory Visit (INDEPENDENT_AMBULATORY_CARE_PROVIDER_SITE_OTHER)

## 2023-11-05 DIAGNOSIS — M722 Plantar fascial fibromatosis: Secondary | ICD-10-CM

## 2023-11-05 MED ORDER — TRIAMCINOLONE ACETONIDE 10 MG/ML IJ SUSP
10.0000 mg | Freq: Once | INTRAMUSCULAR | Status: AC
Start: 2023-11-05 — End: 2023-11-05
  Administered 2023-11-05: 10 mg via INTRA_ARTICULAR

## 2023-11-05 NOTE — Telephone Encounter (Signed)
 Patient requests Kenalog  script to be sent to  [facility] #[id number] [facility], [place] - [address] AT [facility] OF [address] Phone: [phone]  Fax: [phone]

## 2023-11-07 NOTE — Progress Notes (Signed)
 Subjective:   Patient ID: Allison Cunningham, female   DOB: 43 y.o.   MRN: [id number]   HPI Patient states she has developed a lot of pain in her left heel that was fairly recent.  Does not remember specific injury has had history of this in the past but this has been a very bad attack.  Patient does not smoke likes to be active   Review of Systems  All other systems reviewed and are negative.       Objective:  Physical Exam Vitals and nursing note reviewed.  Constitutional:      Appearance: She is well-developed.  Pulmonary:     Effort: Pulmonary effort is normal.  Musculoskeletal:        General: Normal range of motion.  Skin:    General: Skin is warm.  Neurological:     Mental Status: She is alert.     Neurovascular status found to be intact muscle strength found to be adequate range of motion adequate with exquisite discomfort plantar aspect left heel at the insertional point of the tendon into the calcaneus with inflammation and fluid buildup.  Patient is noted to have good digital perfusion well oriented x 3 with moderate discomfort in the Achilles tendon     Assessment:  Acute plantar fasciitis with inflammation fluid around the medial band and moderate probable compensatory posterior tibial tendinitis     Plan:  H&P condition reviewed and I went ahead today I am focusing on the acute heel and did review x-rays.  I then did sterile prep and I injected the plantar fascia at insertion 3 mg Kenalog  5 mg Xylocaine  and applied fascial brace to lift up the arch fitted properly into the arch.  Patient will be seen back to recheck  X-rays indicate that there is plantar spur no indication stress fracture or arthritis

## 2023-11-08 ENCOUNTER — Other Ambulatory Visit: Payer: Self-pay | Admitting: Podiatry

## 2023-11-08 MED ORDER — DICLOFENAC SODIUM 75 MG PO TBEC: 75.0000 mg | DELAYED_RELEASE_TABLET | Freq: Two times a day (BID) | ORAL | 2 refills | Status: AC

## 2023-11-08 NOTE — Telephone Encounter (Signed)
 Left message with details and if is need of further assistance please call back

## 2023-11-10 NOTE — Telephone Encounter (Signed)
 Diclofenac  is fine

## 2023-11-19 ENCOUNTER — Ambulatory Visit: Admitting: Podiatry

## 2023-11-24 ENCOUNTER — Ambulatory Visit: Admitting: Podiatry
